# Patient Record
Sex: Male | Born: 1970 | Race: White | Hispanic: No | Marital: Married | Smoking: Current some day smoker
Health system: Southern US, Community
[De-identification: ages and names within clinical notes are randomized; demographics above are authoritative.]

## PROBLEM LIST (undated history)

## (undated) DIAGNOSIS — S7290XA Unspecified fracture of unspecified femur, initial encounter for closed fracture: Secondary | ICD-10-CM

## (undated) DIAGNOSIS — F1020 Alcohol dependence, uncomplicated: Secondary | ICD-10-CM

## (undated) DIAGNOSIS — I10 Essential (primary) hypertension: Secondary | ICD-10-CM

## (undated) DIAGNOSIS — R569 Unspecified convulsions: Secondary | ICD-10-CM

## (undated) DIAGNOSIS — R112 Nausea with vomiting, unspecified: Secondary | ICD-10-CM

## (undated) DIAGNOSIS — Z9889 Other specified postprocedural states: Secondary | ICD-10-CM

## (undated) HISTORY — DX: Alcohol dependence, uncomplicated: F10.20

## (undated) HISTORY — PX: WRIST SURGERY: SHX841

## (undated) HISTORY — PX: JOINT REPLACEMENT: SHX530

---

## 2001-10-09 ENCOUNTER — Encounter: Payer: Self-pay | Admitting: Orthopedic Surgery

## 2001-10-11 ENCOUNTER — Encounter: Payer: Self-pay | Admitting: Orthopedic Surgery

## 2001-10-11 ENCOUNTER — Inpatient Hospital Stay (HOSPITAL_COMMUNITY): Admission: RE | Admit: 2001-10-11 | Discharge: 2001-10-16 | Payer: Self-pay | Admitting: Orthopedic Surgery

## 2001-10-12 ENCOUNTER — Encounter: Payer: Self-pay | Admitting: Orthopedic Surgery

## 2001-10-15 ENCOUNTER — Encounter: Payer: Self-pay | Admitting: Orthopedic Surgery

## 2006-07-19 ENCOUNTER — Encounter: Admission: RE | Admit: 2006-07-19 | Discharge: 2006-07-19 | Payer: Self-pay | Admitting: Orthopedic Surgery

## 2006-12-18 ENCOUNTER — Inpatient Hospital Stay (HOSPITAL_COMMUNITY): Admission: RE | Admit: 2006-12-18 | Discharge: 2006-12-21 | Payer: Self-pay | Admitting: Orthopedic Surgery

## 2010-05-18 NOTE — Discharge Summary (Signed)
Eugene Jackson, Eugene Jackson            ACCOUNT NO.:  [id number]   MEDICAL RECORD NO.:  [id number]          PATIENT TYPE:  INP   LOCATION:  5029                         FACILITY:  [facility]   PHYSICIAN:  Loreta Ave, M.D. DATE OF BIRTH:  09/14/1970   DATE OF ADMISSION:  12/18/2006  DATE OF DISCHARGE:  12/21/2006                               DISCHARGE SUMMARY   FINAL DIAGNOSES:  1. Status post right total hip replacement for AVN/DJD.  2. Hypertension.  3. Nicotine use.   HISTORY OF PRESENT ILLNESS:  A 40 year old white male with history of  right hip DJD/AVN and chronic pain presented to our office for  preoperative evaluation for a total hip replacement.  He had  progressively worsening pain, which failed response with conservative  treatment.  Significant decrease in his daily activities due to the  ongoing complaint.   HOSPITAL COURSE:  On December 18, 2006, the patient was taken to the  [facility] OR and a right total hip replacement and procedure were  performed.  Surgeon Loreta Ave, M.D., and assistant Genene Churn.  Barry Dienes, PA-C.  Anesthesia general.  No specimens.  EBL less than 100 mL.  There were no surgical or anesthesia complications and the patient was  transferred to recovery in stable condition.  On December 19, 2006, the  patient doing well but complaining of some nausea from high-dose  morphine PCA.  Temperature 101.2, pulse 100, respirations 17, and blood  pressure 115/72.  Hemoglobin 11.7 and hematocrit 33.7.  Sodium 131,  potassium 3.8, chloride 101, CO2 24, BUN 4, creatinine 0.84, glucose  181, INR 1.2.  Dressing clean, dry, and intact.  Calf nontender and  neurovascularly intact distally.  Start PT and anticipate discharge home  in the next couple of days.  Change morphine PCA to low dose and IV to  normal saline plus 20 KCl.  Encouraged incentive spirometry.  Discontinued Foley.  Pharmacy protocol Coumadin was started.  On  December 20, 2006, the patient doing  well.  Progressing with therapy.  WBC 10.3, hemoglobin 11.0, hematocrit 30.9 and platelets 259.  Sodium  133, potassium 4.2, chloride 102, CO2 23, BUN 3, creatinine 0.75,  glucose 96, INR 2.3.  Discontinued PCA and saline lock IV.  On December 21, 2006, the patient doing well with good pain control.  Did great with  PT yesterday.  States that he is ready to go home.  Temperature 100.6,  pulse 89, respirations 18, blood pressure 107/68.  WBC 8.6, hematocrit  30.4, hemoglobin 10.8, platelets 310.  Sodium 135, potassium 4.1,  chloride 105, CO2 25, BUN 6, creatinine 0.79, glucose 99, INR 3.2.  Wound looks good.  Staples intact.  No drainage or signs of infection.  Neurovascularly intact.  He is ready for discharge home today.  We will  hold Coumadin dose today.   CONDITION:  Good and stable.   DISPOSITION:  Discharged home.   MEDICATIONS:  1. Atenolol 50 mg p.o. daily.  2. Norvasc 10 mg p.o. daily.  3. Percocet 5/325 one to two tabs p.o. q.4-6h. p.r.n. for pain.  4. Robaxin 500 mg  1 tab p.o. q.6h. p.r.n. spasms.  5. Coumadin pharmacy protocol.  Gentiva to manage dose and maintain      INR 2 to 3.  6. Combivent inhaler 1 puff q.4-6h. p.r.n.   INSTRUCTIONS:  The patient will work with home health, PT and OT to  improve his ambulation and strengthening.  He will be strict touchdown  weightbearing on the right lower extremity x6 weeks postop.  He is okay  to shower but no tub soaking.  Daily dressing changes with 4 x 4 gauze  and tape.  Coumadin x 4 weeks postop with DVT prophylaxis.  He will have  followup in the office with Dr. Eulah Pont 2 weeks postop for recheck.  Notify us before that time if there are any questions or concerns.      Genene Churn. Denton Meek.      Loreta Ave, M.D.  Electronically Signed    JMO/MEDQ  D:  12/21/2006  T:  12/21/2006  Job:  [id number]

## 2010-05-21 NOTE — Op Note (Signed)
Eugene Jackson, Eugene Jackson            ACCOUNT NO.:  [id number]   MEDICAL RECORD NO.:  [id number]          PATIENT TYPE:  INP   LOCATION:  2550                         FACILITY:  [facility]   PHYSICIAN:  Loreta Ave, M.D. DATE OF BIRTH:  1970/12/17   DATE OF PROCEDURE:  12/28/2006  DATE OF DISCHARGE:                               OPERATIVE REPORT   PREOPERATIVE DIAGNOSIS:  Avascular necrosis right hip, with collapse and  degenerative arthritis.   POSTOPERATIVE DIAGNOSIS:  Avascular necrosis right hip, with collapse  and degenerative arthritis.   PROCEDURE:  Right total hip replacement, Stryker Accolade prosthesis.  Press-Fit 54 mm PSL acetabulum screw fixation x2, and a 32 mm internal  diameter ceramic liner.  Press-Fit #4 Accolade femoral component. with a  35 mm neck, 127-degree neck angle..  A 32-mm +0 ceramic head.   SURGEON:  Loreta Ave, M.D.   ASSISTANT:  Genene Churn. Denton Meek., present throughout the entire case.   ANESTHESIA:  General.   BLOOD LOSS:  100 mL.   BLOOD GIVEN:  None.   SPECIMENS:  Excised bone with soft tissue.   COMPLICATIONS:  None.   PROCEDURE:  Soft, compressive, with abduction pillow.   PROCEDURE:  The patient was brought to the operating room and placed on  the operating table in the supine position.  After adequate anesthesia  had been obtained, turned to a lateral position, right side up.  Prepped  and draped in the usual sterile fashion.  Incision along the shaft of  the femur to the trochanter extending posterosuperior.  Skin and  subcutaneous tissue divided.  Iliotibial band exposed, incised.  Charnley retractor put in place.  Neurovascular structures were  identified and protected.  External rotator and capsule taken down off  the back of the intertrochanteric groove, tagged with FiberWire.  A  clear effusion.  The hip dislocated posteriorly.  Femoral neck cut one  fingerbreadth above the lesser trochanter, head removed.  Marked  delamination and avascular gross.  Acetabulum cleared, moderate changes  there.  Hypertrophic synovitis, redundant labrum all debrided.  Reaming  of the acetabulum to good bleeding bone and sufficient medial and  inferior placement.  Sized and fitted with a 54 mm cup, placed at 45  degrees of abduction, 20 degrees of anteversion.  Excellent capture and  fixation.  It was augmented with two screws through the cup, a 20 mm and  16 mm.  A 32 mm internal diameter, ceramic liner put in place.  Attention was turned to the femur.  Good filling proximally and distally  with the handheld reamers, and then fitted with a #4 component, which  restored normal femoral anteversion.  After appropriate trials, I used  the +0 x 32 mm head, which was attached and the hip was reduced.  External restoration of leg lengths, good stability in flexion and  extension.  Wound irrigated.  External rotator capsule repair of the  back of the intertrochanteric groove through drill holes and the suture  tied over a bony bridge.  Charnley retractor removed.  Iliotibial band  closed over #1 Vicryl, skin  and subcutaneous  tissues with Vicryl and staples.  Margins were injected  with Marcaine.  A sterile compressive dressing was applied.  Returned to  the supine position.  Abduction pillow placed.  Anesthesia reversed.  Brought to the recovery room.  Tolerated surgery well.  No  complications.      Loreta Ave, M.D.  Electronically Signed     DFM/MEDQ  D:  12/18/2006  T:  12/19/2006  Job:  [id number]

## 2010-05-21 NOTE — Op Note (Signed)
Eugene Jackson, Eugene Jackson                      ACCOUNT NO.:  [id number]   MEDICAL RECORD NO.:  [id number]                   PATIENT TYPE:  INP   LOCATION:  2899                                 FACILITY:  [facility]   PHYSICIAN:  Loreta Ave, M.D.              DATE OF BIRTH:  [birth year]   DATE OF PROCEDURE:  10/11/2001  DATE OF DISCHARGE:                                 OPERATIVE REPORT   PREOPERATIVE DIAGNOSIS:  Avascular necrosis with subsequent degenerative  arthritis, left hip.   POSTOPERATIVE DIAGNOSIS:  Avascular necrosis with subsequent degenerative  arthritis, left hip.   PROCEDURE:  Left total hip replacement utilizing Osteonics prosthesis.  A 56  mm Trident acetabular component with screw fixation x2 and a size F ceramic  insert, 32 mm internal diameter.  Press-fit femoral component.  A #8  SecureFit Plus stem with a 12 mm distal diameter.  A +5 32 mm ceramic  femoral head.   SURGEON:  Loreta Ave, M.D.   ASSISTANT:  Arlys John D. Petrarca, P.A.-C.   ANESTHESIA:  General.   ESTIMATED BLOOD LOSS:  200 cc.   SPECIMENS:  Excised femoral head.   CULTURES:  None.   COMPLICATIONS:  None.   DRESSING:  Soft compressive with abduction pillow.   DESCRIPTION OF PROCEDURE:  Patient brought to the operating room and placed  on the operating table in supine position.  After adequate anesthesia had  been obtained, turned to a lateral position with appropriate positioning and  support.  Incision along the femur extending posterior superior.  Skin and  subcutaneous tissue divided.  The iliotibial band exposed, incised, and the  Charnley retractor put in place.  Neurovascular structures identified and  protected.  External rotators and capsule taken down off the back of the  intertrochanteric groove of the femur and tagged with #1 Ethibond.  The hip  exposed.  Dislocated posteriorly.  Marked reactive synovitis and effusion  from the joint cleared.  Femoral head removed one  fingerbreadth above the  lesser trochanter in line with the definitive component.  The femoral head  resected.  Grade 3 and 4 changes from avascular necrosis with delamination  and fragmentation.  Grade 3 changes in the acetabulum.  Retractors put in  place.  Acetabulum exposed.  Enough changes to warrant total rather than  hemiarthroplasty.  Reaming to good bleeding bone in the acetabulum.  Sized  for a 56 mm component.  The metallic portion hammered in place at 45 degrees  of abduction and 20 of anteversion.  Fixation augmented with a 20 mm screw  above, a 16 mm screw in back.  A size F ceramic insert was placed within the  cup.  Attention turned to the femur.  Power and hand-held reamers up to good  fitting proximally and distally in the femur.  Sized for a #8 SecureFit Plus  component with a 12 mm distal stem.  After appropriate trialling,  a +5 head  with 127 degree neck angle was chosen to match leg lengths and produce  stability.  Once reaming complete, the definitive component assembled and  hammered in place.  The +5 femoral head was attached.  The hip reduced.  Excellent stability in flexion-extension with good motion and good  restoration of leg lengths.  The wound irrigated.  External rotators and  capsule repaired to the back of the intertrochanteric groove through drill  holes and over-tied over a bony bridge.  The Charnley retractor removed.  Iliotibial band closed with #1 Vicryl, skin and subcutaneous tissue with  Vicryl and staples.  Margins of the wound injected with Marcaine.  Sterile  compressive dressing applied.  Returned to the supine position.  Abduction  pillow placed.  Anesthesia reversed.  Brought to the recovery room.  Tolerated the surgery well with no complications.                                               Loreta Ave, M.D.    DFM/MEDQ  D:  10/11/2001  T:  10/12/2001  Job:  [id number]

## 2010-05-21 NOTE — Discharge Summary (Signed)
Eugene Jackson, Eugene Jackson                      ACCOUNT NO.:  [id number]   MEDICAL RECORD NO.:  [id number]                   PATIENT TYPE:  EMS   LOCATION:  MINO                                 FACILITY:  [facility]   PHYSICIAN:  Oris Drone. Petrarca, P.A.-C.          DATE OF BIRTH:  1970/03/14   DATE OF ADMISSION:  10/17/2001  DATE OF DISCHARGE:  10/17/2001                                 DISCHARGE SUMMARY   ADMISSION DIAGNOSIS:  Avascular necrosis of the left hip.   DISCHARGE DIAGNOSES:  Avascular necrosis of the left hip.   PROCEDURE:  Left total hip replacement.   HISTORY OF PRESENT ILLNESS:  The patient is a 40 year old white male with  bilateral AVN of the  hips. He had significant pain without much in the way  of any relief. He is now having  difficulties with activities of daily  living as well as with ambulation. He is now indicated for admission and  left hemiarthroplasty versus total hip replacement.   HOSPITAL COURSE:  This 40 year old white male was admitted on October 11, 2001, and after appropriate laboratory studies were obtained, as well as 1  gm Ancef  IV on call to the operating room, he was  taken to the operating  room where he underwent a left total hip replacement. He tolerated the  procedure well. He was allowed out of bed to a chair the following day. A  Foley catheter was introduced on the day of surgery and discontinued on  postoperative day #2. A Dilaudid PCA pump was used for pain management.   We consulted physical therapy and occupational therapy for his ambulation.  He may be touchdown weightbearing for 10 pounds only. He was allowed out of  bed to a chair the following day. Because of a previous history of ETOH use,  DT precautions were ordered. Robaxin was used for muscle spasms. Because of  a temperature, a chest x-ray, CBC and differential, blood cultures x 2, as  well as a urinalysis and completed metabolic panel were used. There were  difficulties  with the use of Tylenol to decrease his temperature.  Suppositories was ordered because of an inability to hold down the Tylenol.   His IVs were increased on the 10th to 100 cc per hour. Intravenous Tequin  400 mg every 24 hours was begun. Albuterol 5 mg and Ventolin 5 mg nebulizer  was used every 6 hours.   His dressing was changed on the 11th. His PCA pump was discontinued also on  the 11th.  There was a question of whether antibiotics should be continued.  Initially they had been stopped on the 11th, but then were restarted on the  12th. His temperature improved.   His IV was discontinued  on the 13th. He was discharged on the 14th to  return back in our office for followup. He was discharged in improved  condition.   LABORATORY DATA:  An EKG  revealed normal sinus rhythm with normal EKG.  A  chest x-ray on October 15, 2001, revealed stable chest with a small left  effusion and left basilar atelectatic changes. A chest x-ray on October 12, 2001, revealed left basilar atelectasis and a small left pleural effusion.   He was admitted with a hemoglobin of 15.8, hematocrit 45.5%, white count  2800, platelets 348,000. His discharge hemoglobin was 13.5, hematocrit  40.3%, white count 8300, platelets 427,000. Chemistries revealed  preoperatively sodium 137, potassium 4.0, chloride 101, CO2 27, glucose 115,  BUN 6, creatinine 1.0, calcium 9.5. Total protein 7.3, albumin 3.6, AST 34,  ALT 30, ALP 70, total bilirubin 0.5. Discharge sodium 134, potassium 3.4,  chloride 96, CO2 28, BUN 6, creatinine 0.8, calcium 9.0.  Urinalysis  preoperatively was benign. Urine of October 12, 2001, revealed 3 to 6  whites, 0 to 2 reds, no bacteria. He was blood type AB positive, antibody  screen negative. Blood cultures x 2 showed no growth.   DISCHARGE MEDICATIONS:  1. Percocet 5/325 1 to 2 tablets p.o. q.4h. p.r.n. pain.  2. Coumadin 5 mg tablets as directed by [facility].  3. Colace 100 mg 1 tablet  b.i.d. with meal.  4. Iron sulfate 325 mg b.i.d. with food.   DISCHARGE INSTRUCTIONS:  Activity as tolerated with physical therapy. No  restrictions on diet. He will change his dressing daily and keep it clean  and dry. He is to call if he has increased redness, pain, drainage or  temperature over 101 degrees  Farenheit.   FOLLOW UP:  He will follow back up  with [place] on Friday morning.   DISPOSITION:  He was discharged in improved condition.                                               Oris Drone Petrarca, P.A.-C.    BDP/MEDQ  D:  10/27/2001  T:  10/29/2001  Job:  [id number]

## 2010-10-08 LAB — CBC
HCT: 30.4 — ABNORMAL LOW
HCT: 30.9 — ABNORMAL LOW
HCT: 33.7 — ABNORMAL LOW
Hemoglobin: 11 — ABNORMAL LOW
Hemoglobin: 11.7 — ABNORMAL LOW
MCHC: 35.5
MCV: 102.5 — ABNORMAL HIGH
MCV: 103.2 — ABNORMAL HIGH
Platelets: 310
RBC: 3 — ABNORMAL LOW
RDW: 12
WBC: 8.6
WBC: 9.4

## 2010-10-08 LAB — BASIC METABOLIC PANEL
BUN: 6
CO2: 23
Chloride: 101
Chloride: 101
Chloride: 102
GFR calc Af Amer: >60
GFR calc non Af Amer: >60
Glucose, Bld: 131 — ABNORMAL HIGH
Glucose, Bld: 99
Potassium: 3.8
Potassium: 4.1
Sodium: 131 — ABNORMAL LOW
Sodium: 133 — ABNORMAL LOW

## 2010-10-08 LAB — URINALYSIS, ROUTINE W REFLEX MICROSCOPIC
Bilirubin Urine: NEGATIVE
Glucose, UA: NEGATIVE
Nitrite: NEGATIVE
Specific Gravity, Urine: 1.022
pH: 5.5

## 2010-10-11 LAB — URINALYSIS, ROUTINE W REFLEX MICROSCOPIC
Glucose, UA: NEGATIVE
Hgb urine dipstick: NEGATIVE
Ketones, ur: 40 — AB
pH: 6

## 2010-10-11 LAB — APTT: aPTT: 28

## 2010-10-11 LAB — COMPREHENSIVE METABOLIC PANEL
ALT: 28
AST: 39 — ABNORMAL HIGH
Alkaline Phosphatase: 77
CO2: 24
Chloride: 99
Creatinine, Ser: 0.81
GFR calc Af Amer: >60
GFR calc non Af Amer: >60
Potassium: 3.6
Total Bilirubin: 1

## 2010-10-11 LAB — CBC
MCV: 103.9 — ABNORMAL HIGH
RBC: 4.23
WBC: 7.5

## 2010-10-11 LAB — TYPE AND SCREEN: Antibody Screen: NEGATIVE

## 2012-08-30 ENCOUNTER — Inpatient Hospital Stay (HOSPITAL_COMMUNITY)
Admission: EM | Admit: 2012-08-30 | Discharge: 2012-09-07 | DRG: 896 | Disposition: A | Discharge status: Home / Self Care | Payer: No Typology Code available for payment source | Attending: Internal Medicine | Admitting: Internal Medicine

## 2012-08-30 ENCOUNTER — Encounter (HOSPITAL_COMMUNITY): Payer: Self-pay | Admitting: Emergency Medicine

## 2012-08-30 ENCOUNTER — Emergency Department (HOSPITAL_COMMUNITY): Payer: No Typology Code available for payment source

## 2012-08-30 DIAGNOSIS — R7309 Other abnormal glucose: Secondary | ICD-10-CM | POA: Diagnosis present

## 2012-08-30 DIAGNOSIS — E861 Hypovolemia: Secondary | ICD-10-CM | POA: Diagnosis present

## 2012-08-30 DIAGNOSIS — J9801 Acute bronchospasm: Secondary | ICD-10-CM | POA: Diagnosis present

## 2012-08-30 DIAGNOSIS — F10939 Alcohol use, unspecified with withdrawal, unspecified: Secondary | ICD-10-CM | POA: Diagnosis present

## 2012-08-30 DIAGNOSIS — E876 Hypokalemia: Secondary | ICD-10-CM

## 2012-08-30 DIAGNOSIS — G9340 Encephalopathy, unspecified: Secondary | ICD-10-CM

## 2012-08-30 DIAGNOSIS — Z993 Dependence on wheelchair: Secondary | ICD-10-CM

## 2012-08-30 DIAGNOSIS — E871 Hypo-osmolality and hyponatremia: Secondary | ICD-10-CM

## 2012-08-30 DIAGNOSIS — IMO0002 Reserved for concepts with insufficient information to code with codable children: Secondary | ICD-10-CM

## 2012-08-30 DIAGNOSIS — R748 Abnormal levels of other serum enzymes: Secondary | ICD-10-CM

## 2012-08-30 DIAGNOSIS — R945 Abnormal results of liver function studies: Secondary | ICD-10-CM | POA: Diagnosis present

## 2012-08-30 DIAGNOSIS — I10 Essential (primary) hypertension: Secondary | ICD-10-CM | POA: Diagnosis present

## 2012-08-30 DIAGNOSIS — K709 Alcoholic liver disease, unspecified: Secondary | ICD-10-CM

## 2012-08-30 DIAGNOSIS — F10929 Alcohol use, unspecified with intoxication, unspecified: Secondary | ICD-10-CM

## 2012-08-30 DIAGNOSIS — E872 Acidosis, unspecified: Secondary | ICD-10-CM

## 2012-08-30 DIAGNOSIS — F1010 Alcohol abuse, uncomplicated: Principal | ICD-10-CM

## 2012-08-30 DIAGNOSIS — J4489 Other specified chronic obstructive pulmonary disease: Secondary | ICD-10-CM | POA: Diagnosis present

## 2012-08-30 DIAGNOSIS — Z96649 Presence of unspecified artificial hip joint: Secondary | ICD-10-CM

## 2012-08-30 DIAGNOSIS — N179 Acute kidney failure, unspecified: Secondary | ICD-10-CM

## 2012-08-30 DIAGNOSIS — R7989 Other specified abnormal findings of blood chemistry: Secondary | ICD-10-CM

## 2012-08-30 DIAGNOSIS — D61818 Other pancytopenia: Secondary | ICD-10-CM

## 2012-08-30 DIAGNOSIS — D696 Thrombocytopenia, unspecified: Secondary | ICD-10-CM

## 2012-08-30 DIAGNOSIS — K529 Noninfective gastroenteritis and colitis, unspecified: Secondary | ICD-10-CM

## 2012-08-30 DIAGNOSIS — F17200 Nicotine dependence, unspecified, uncomplicated: Secondary | ICD-10-CM | POA: Diagnosis present

## 2012-08-30 DIAGNOSIS — R062 Wheezing: Secondary | ICD-10-CM | POA: Diagnosis present

## 2012-08-30 DIAGNOSIS — D709 Neutropenia, unspecified: Secondary | ICD-10-CM

## 2012-08-30 DIAGNOSIS — A419 Sepsis, unspecified organism: Secondary | ICD-10-CM

## 2012-08-30 DIAGNOSIS — F10239 Alcohol dependence with withdrawal, unspecified: Secondary | ICD-10-CM | POA: Diagnosis present

## 2012-08-30 DIAGNOSIS — J449 Chronic obstructive pulmonary disease, unspecified: Secondary | ICD-10-CM | POA: Diagnosis present

## 2012-08-30 DIAGNOSIS — D638 Anemia in other chronic diseases classified elsewhere: Secondary | ICD-10-CM

## 2012-08-30 DIAGNOSIS — R569 Unspecified convulsions: Secondary | ICD-10-CM

## 2012-08-30 DIAGNOSIS — G40401 Other generalized epilepsy and epileptic syndromes, not intractable, with status epilepticus: Secondary | ICD-10-CM | POA: Diagnosis not present

## 2012-08-30 DIAGNOSIS — K5100 Ulcerative (chronic) pancolitis without complications: Secondary | ICD-10-CM | POA: Diagnosis present

## 2012-08-30 DIAGNOSIS — K859 Acute pancreatitis without necrosis or infection, unspecified: Secondary | ICD-10-CM

## 2012-08-30 DIAGNOSIS — Z79899 Other long term (current) drug therapy: Secondary | ICD-10-CM

## 2012-08-30 DIAGNOSIS — D65 Disseminated intravascular coagulation [defibrination syndrome]: Secondary | ICD-10-CM

## 2012-08-30 DIAGNOSIS — E440 Moderate protein-calorie malnutrition: Secondary | ICD-10-CM

## 2012-08-30 HISTORY — DX: Unspecified fracture of unspecified femur, initial encounter for closed fracture: S72.90XA

## 2012-08-30 HISTORY — DX: Essential (primary) hypertension: I10

## 2012-08-30 HISTORY — DX: Other specified postprocedural states: R11.2

## 2012-08-30 HISTORY — DX: Other specified postprocedural states: Z98.890

## 2012-08-30 LAB — CBC WITH DIFFERENTIAL/PLATELET
Basophils Relative: 0 % (ref 0–1)
Eosinophils Absolute: 0.1 10*3/uL (ref 0.0–0.7)
Eosinophils Relative: 2 % (ref 0–5)
HCT: 25.1 % — ABNORMAL LOW (ref 39.0–52.0)
Hemoglobin: 9.5 g/dL — ABNORMAL LOW (ref 13.0–17.0)
MCH: 36.8 pg — ABNORMAL HIGH (ref 26.0–34.0)
MCHC: 37.8 g/dL — ABNORMAL HIGH (ref 30.0–36.0)
Monocytes Absolute: 0.1 10*3/uL (ref 0.1–1.0)
Neutro Abs: 1.5 10*3/uL — ABNORMAL LOW (ref 1.7–7.7)
RBC: 2.58 MIL/uL — ABNORMAL LOW (ref 4.22–5.81)

## 2012-08-30 LAB — COMPREHENSIVE METABOLIC PANEL
AST: 51 U/L — ABNORMAL HIGH (ref 0–37)
Albumin: 3.3 g/dL — ABNORMAL LOW (ref 3.5–5.2)
Alkaline Phosphatase: 93 U/L (ref 39–117)
BUN: 33 mg/dL — ABNORMAL HIGH (ref 6–23)
CO2: 23 mEq/L (ref 19–32)
Chloride: <65 mEq/L — CL (ref 96–112)
Creatinine, Ser: 1.68 mg/dL — ABNORMAL HIGH (ref 0.50–1.35)
GFR calc non Af Amer: 48 mL/min — ABNORMAL LOW (ref >90)
Potassium: 2 mEq/L — CL (ref 3.5–5.1)
Total Bilirubin: 1.8 mg/dL — ABNORMAL HIGH (ref 0.3–1.2)

## 2012-08-30 LAB — BLOOD GAS, ARTERIAL
Acid-Base Excess: 7.2 mmol/L — ABNORMAL HIGH (ref 0.0–2.0)
Bicarbonate: 29 mEq/L — ABNORMAL HIGH (ref 20.0–24.0)
Drawn by: 307971
TCO2: 26.3 mmol/L (ref 0–100)
pCO2 arterial: 29.4 mmHg — ABNORMAL LOW (ref 35.0–45.0)
pO2, Arterial: 81.9 mmHg (ref 80.0–100.0)

## 2012-08-30 LAB — AMMONIA: Ammonia: 47 umol/L (ref 11–60)

## 2012-08-30 LAB — LIPASE, BLOOD: Lipase: 279 U/L — ABNORMAL HIGH (ref 11–59)

## 2012-08-30 LAB — ABO/RH: ABO/RH(D): AB POS

## 2012-08-30 LAB — PROTIME-INR
INR: 1.1 (ref 0.00–1.49)
Prothrombin Time: 14 seconds (ref 11.6–15.2)

## 2012-08-30 LAB — MRSA PCR SCREENING: MRSA by PCR: NEGATIVE

## 2012-08-30 LAB — CG4 I-STAT (LACTIC ACID): Lactic Acid, Venous: 11.96 mmol/L — ABNORMAL HIGH (ref 0.5–2.2)

## 2012-08-30 LAB — MAGNESIUM: Magnesium: 1.3 mg/dL — ABNORMAL LOW (ref 1.5–2.5)

## 2012-08-30 LAB — LACTIC ACID, PLASMA: Lactic Acid, Venous: 1.1 mmol/L (ref 0.5–2.2)

## 2012-08-30 LAB — TROPONIN I: Troponin I: <0.3 ng/mL (ref <0.30)

## 2012-08-30 MED ORDER — ASPIRIN 81 MG PO CHEW: 324.0000 mg | CHEWABLE_TABLET | ORAL | Status: DC

## 2012-08-30 MED ORDER — ONDANSETRON 8 MG/NS 50 ML IVPB
8.0000 mg | Freq: Four times a day (QID) | INTRAVENOUS | Status: DC | PRN
  Administered 2012-08-30 – 2012-09-01 (×3): 8 mg via INTRAVENOUS
  Filled 2012-08-30 (×3): qty 8

## 2012-08-30 MED ORDER — SODIUM CHLORIDE 0.9 % IV BOLUS (SEPSIS)
1000.0000 mL | Freq: Once | INTRAVENOUS | Status: AC
  Administered 2012-08-30: 1000 mL via INTRAVENOUS

## 2012-08-30 MED ORDER — ALBUTEROL SULFATE (5 MG/ML) 0.5% IN NEBU: 2.5000 mg | INHALATION_SOLUTION | Freq: Four times a day (QID) | RESPIRATORY_TRACT | Status: DC

## 2012-08-30 MED ORDER — SODIUM CHLORIDE 0.9 % IV SOLN
INTRAVENOUS | Status: DC
  Administered 2012-08-30: 21:00:00 via INTRAVENOUS

## 2012-08-30 MED ORDER — KCL IN DEXTROSE-NACL 40-5-0.9 MEQ/L-%-% IV SOLN
INTRAVENOUS | Status: DC
  Administered 2012-08-30 – 2012-09-02 (×6): via INTRAVENOUS
  Administered 2012-09-02: 1000 mL via INTRAVENOUS
  Filled 2012-08-30 (×10): qty 1000

## 2012-08-30 MED ORDER — LORAZEPAM 2 MG/ML IJ SOLN
0.0000 mg | Freq: Four times a day (QID) | INTRAMUSCULAR | Status: AC
  Filled 2012-08-30: qty 1

## 2012-08-30 MED ORDER — FOLIC ACID 1 MG PO TABS
1.0000 mg | ORAL_TABLET | Freq: Once | ORAL | Status: DC
  Filled 2012-08-30: qty 1

## 2012-08-30 MED ORDER — SODIUM CHLORIDE 0.9 % IV SOLN: 250.0000 mL | INTRAVENOUS | Status: DC | PRN

## 2012-08-30 MED ORDER — CIPROFLOXACIN IN D5W 400 MG/200ML IV SOLN
400.0000 mg | Freq: Two times a day (BID) | INTRAVENOUS | Status: DC
  Administered 2012-08-30 – 2012-09-04 (×10): 400 mg via INTRAVENOUS
  Filled 2012-08-30 (×10): qty 200

## 2012-08-30 MED ORDER — METRONIDAZOLE IN NACL 5-0.79 MG/ML-% IV SOLN
500.0000 mg | Freq: Three times a day (TID) | INTRAVENOUS | Status: DC
  Administered 2012-08-30 – 2012-09-03 (×11): 500 mg via INTRAVENOUS
  Filled 2012-08-30 (×13): qty 100

## 2012-08-30 MED ORDER — LORAZEPAM 2 MG/ML IJ SOLN
0.0000 mg | INTRAMUSCULAR | Status: AC
  Administered 2012-08-30: 2 mg via INTRAVENOUS
  Administered 2012-08-31 (×2): 1 mg via INTRAVENOUS
  Filled 2012-08-30: qty 1

## 2012-08-30 MED ORDER — THIAMINE HCL 100 MG/ML IJ SOLN
500.0000 mg | Freq: Every day | INTRAMUSCULAR | Status: DC
  Administered 2012-08-31: 500 mg via INTRAVENOUS
  Filled 2012-08-30 (×5): qty 5

## 2012-08-30 MED ORDER — SODIUM CHLORIDE 0.9 % IV SOLN: 1000.0000 mL | Freq: Once | INTRAVENOUS | Status: DC

## 2012-08-30 MED ORDER — VITAMIN B-1 100 MG PO TABS
500.0000 mg | ORAL_TABLET | Freq: Every day | ORAL | Status: DC
  Administered 2012-09-01 – 2012-09-07 (×7): 500 mg via ORAL
  Filled 2012-08-30 (×8): qty 5

## 2012-08-30 MED ORDER — ALBUTEROL SULFATE (5 MG/ML) 0.5% IN NEBU
2.5000 mg | INHALATION_SOLUTION | Freq: Four times a day (QID) | RESPIRATORY_TRACT | Status: DC
  Administered 2012-08-30 – 2012-09-07 (×25): 2.5 mg via RESPIRATORY_TRACT
  Filled 2012-08-30 (×25): qty 0.5

## 2012-08-30 MED ORDER — IPRATROPIUM BROMIDE 0.02 % IN SOLN: 0.5000 mg | RESPIRATORY_TRACT | Status: DC | PRN

## 2012-08-30 MED ORDER — LORAZEPAM 2 MG/ML IJ SOLN
1.0000 mg | INTRAMUSCULAR | Status: AC | PRN
  Filled 2012-08-30: qty 1

## 2012-08-30 MED ORDER — IPRATROPIUM BROMIDE 0.02 % IN SOLN: 0.5000 mg | RESPIRATORY_TRACT | Status: DC

## 2012-08-30 MED ORDER — ALBUTEROL SULFATE (5 MG/ML) 0.5% IN NEBU
2.5000 mg | INHALATION_SOLUTION | RESPIRATORY_TRACT | Status: DC | PRN
  Filled 2012-08-30: qty 0.5

## 2012-08-30 MED ORDER — LORAZEPAM 1 MG PO TABS: 1.0000 mg | ORAL_TABLET | Freq: Four times a day (QID) | ORAL | Status: DC | PRN

## 2012-08-30 MED ORDER — MAGNESIUM SULFATE 40 MG/ML IJ SOLN
2.0000 g | Freq: Once | INTRAMUSCULAR | Status: DC
  Filled 2012-08-30: qty 50

## 2012-08-30 MED ORDER — VANCOMYCIN HCL 500 MG IV SOLR: 500.0000 mg | Freq: Two times a day (BID) | INTRAVENOUS | Status: DC

## 2012-08-30 MED ORDER — MAGNESIUM SULFATE 40 MG/ML IJ SOLN
4.0000 g | Freq: Once | INTRAMUSCULAR | Status: AC
  Administered 2012-08-30: 4 g via INTRAVENOUS
  Filled 2012-08-30: qty 100

## 2012-08-30 MED ORDER — PANTOPRAZOLE SODIUM 40 MG IV SOLR
40.0000 mg | Freq: Every day | INTRAVENOUS | Status: DC
  Administered 2012-08-31 (×2): 40 mg via INTRAVENOUS
  Filled 2012-08-30 (×3): qty 40

## 2012-08-30 MED ORDER — ADULT MULTIVITAMIN W/MINERALS CH
1.0000 | ORAL_TABLET | Freq: Once | ORAL | Status: DC
  Filled 2012-08-30: qty 1

## 2012-08-30 MED ORDER — SODIUM CHLORIDE 0.9 % IV SOLN: 1000.0000 mL | INTRAVENOUS | Status: DC

## 2012-08-30 MED ORDER — VANCOMYCIN HCL IN DEXTROSE 1-5 GM/200ML-% IV SOLN
1000.0000 mg | Freq: Once | INTRAVENOUS | Status: DC
  Administered 2012-08-30: 1000 mg via INTRAVENOUS
  Filled 2012-08-30: qty 200

## 2012-08-30 MED ORDER — THIAMINE HCL 100 MG/ML IJ SOLN
100.0000 mg | Freq: Once | INTRAMUSCULAR | Status: AC
  Administered 2012-08-30: 200 mg via INTRAVENOUS
  Filled 2012-08-30: qty 2

## 2012-08-30 MED ORDER — IPRATROPIUM BROMIDE 0.02 % IN SOLN
0.5000 mg | Freq: Four times a day (QID) | RESPIRATORY_TRACT | Status: DC
  Administered 2012-08-30 – 2012-09-07 (×25): 0.5 mg via RESPIRATORY_TRACT
  Filled 2012-08-30 (×24): qty 2.5

## 2012-08-30 MED ORDER — IOHEXOL 300 MG/ML  SOLN
80.0000 mL | Freq: Once | INTRAMUSCULAR | Status: AC | PRN
  Administered 2012-08-30: 80 mL via INTRAVENOUS

## 2012-08-30 MED ORDER — SODIUM CHLORIDE 0.9 % IV SOLN
1.0000 mg | Freq: Every day | INTRAVENOUS | Status: DC
  Administered 2012-08-30 – 2012-09-05 (×7): 1 mg via INTRAVENOUS
  Filled 2012-08-30 (×8): qty 0.2

## 2012-08-30 MED ORDER — ASPIRIN 300 MG RE SUPP: 300.0000 mg | RECTAL | Status: DC

## 2012-08-30 MED ORDER — SODIUM CHLORIDE 0.9 % IV SOLN
1000.0000 mL | Freq: Once | INTRAVENOUS | Status: AC
  Administered 2012-08-30: 1000 mL via INTRAVENOUS

## 2012-08-30 MED ORDER — POTASSIUM CHLORIDE 10 MEQ/100ML IV SOLN
10.0000 meq | INTRAVENOUS | Status: AC
  Administered 2012-08-30 (×4): 10 meq via INTRAVENOUS
  Filled 2012-08-30 (×4): qty 100

## 2012-08-30 MED ORDER — IOHEXOL 300 MG/ML  SOLN
50.0000 mL | Freq: Once | INTRAMUSCULAR | Status: AC | PRN
  Administered 2012-08-30: 50 mL via ORAL

## 2012-08-30 MED ORDER — PIPERACILLIN-TAZOBACTAM 3.375 G IVPB: 3.3750 g | Freq: Three times a day (TID) | INTRAVENOUS | Status: DC

## 2012-08-30 MED ORDER — ADULT MULTIVITAMIN W/MINERALS CH
1.0000 | ORAL_TABLET | Freq: Every day | ORAL | Status: DC
  Administered 2012-09-02 – 2012-09-07 (×6): 1 via ORAL
  Filled 2012-08-30 (×8): qty 1

## 2012-08-30 MED ORDER — PIPERACILLIN-TAZOBACTAM 3.375 G IVPB 30 MIN
3.3750 g | Freq: Once | INTRAVENOUS | Status: DC
  Administered 2012-08-30: 3.375 g via INTRAVENOUS
  Filled 2012-08-30: qty 50

## 2012-08-30 NOTE — Progress Notes (Signed)
Patient resting at this time.  Patient's sister at bedside.  As per patient's siter, the patient's pcp is Dr. Cheri Rous of [facility].

## 2012-08-30 NOTE — Progress Notes (Signed)
eLink Physician-Brief Progress Note Patient Name: RYHEEM JAY DOB: 1970-04-30 MRN: [id number]  Date of Service  08/30/2012   HPI/Events of Note   diffuse colitis on CT   eICU Interventions  Cipro/flagyl chk c diff pcr for comlpetion, h/o priro abx not clear   Intervention Category Intermediate Interventions: Diagnostic test evaluation  ALVA,RAKESH V. 08/30/2012, 7:29 PM

## 2012-08-30 NOTE — Progress Notes (Addendum)
ANTIBIOTIC CONSULT NOTE - INITIAL  Pharmacy Consult for Vancomycin, Zosyn Indication: sepsis  No Known Allergies  Patient Measurements: Height: 5\' 7"  (170.2 cm) Weight: 140 lb (63.504 kg) IBW/kg (Calculated) : 66.1  Vital Signs: Temp: 98.2 F (36.8 C) (08/28 1454) Temp src: Oral (08/28 1454) BP: 94/63 mmHg (08/28 1454) Intake/Output from previous day:   Intake/Output from this shift:    Labs:  Recent Labs  08/30/12 1500  WBC 2.6*  HGB 9.5*  PLT 24*  CREATININE 1.68*   Estimated Creatinine Clearance: 50.4 ml/min (by C-G formula based on Cr of 1.68). No results found for this basename: VANCOTROUGH, VANCOPEAK, VANCORANDOM, GENTTROUGH, GENTPEAK, GENTRANDOM, TOBRATROUGH, TOBRAPEAK, TOBRARND, AMIKACINPEAK, AMIKACINTROU, AMIKACIN,  in the last 72 hours   Microbiology: No results found for this or any previous visit (from the past 720 hour(s)).  Medical History: Past Medical History  Diagnosis Date  . Hypertension   . Broken femur     right  . PONV (postoperative nausea and vomiting)     Assessment: 42 yo presented 8/28 with c/o generalized weakness, SOB, N/V/D x 1 week and abnormal labs drawn by PCP office 8/27. In ED, WBC 2.6K, Hgb 9.5, Plts 24K, Na+ 112, K+ 2, Cl- < 65, Scr 1.68, lactic acid elevated at 11.96, pt tachycardic.  [facility] called. MD ordered for Vancomycin, Zosyn per pharmacy - first dose already sent to the ED. CT head and CXR with no acute findings.  ?source of infection. Scr 1.68 for CG CrCl 50 ml/min and normalized CrCl of 57 ml/min  Goal of Therapy:  Vancomycin trough level 15-20 mcg/ml Appropriate renal adjustment of Zosyn  Plan:   Give first dose of Vancomycin and Zosyn previously ordered then continue with Vancomycin 500mg  IV q12h and Zosyn 3.375 gm IV q8h (extended infusion over 4 hours)  Will contact MD to consider ordering blood cultures  Pharmacy will f/u   Geoffry Paradise, PharmD, BCPS Pager: [phone] 4:40 PM Pharmacy #:  [id number]   Addendum:   Abx discontinued by CCM  Geoffry Paradise, PharmD, BCPS Pager: [phone] 5:14 PM Pharmacy #: [id number]

## 2012-08-30 NOTE — ED Notes (Signed)
Pt presenting to ed with c/o abnormal labs and generalized weakness. Pt denies rectal bleeding and hemoptysis at this time pt states positive dizziness and lightheadedness at this time

## 2012-08-30 NOTE — Progress Notes (Signed)
Patient does not use a computer and does not want to sign up for My Chart. Briscoe Burns BSN, RN-BC Admissions RN  08/30/2012 4:35 PM

## 2012-08-30 NOTE — ED Notes (Signed)
Patient transported to CT 

## 2012-08-30 NOTE — ED Provider Notes (Signed)
CSN: [id number]     Arrival date & time 08/30/12  1446 History   First MD Initiated Contact with Patient 08/30/12 1502     Chief Complaint  Patient presents with  . abnormal labs   (Consider location/radiation/quality/duration/timing/severity/associated sxs/prior Treatment) HPI Comments: Patient brought to the ER for evaluation of generalized weakness. Patient reportedly has had nausea, vomiting and diarrhea for a week. Family reports that he is an alcoholic and has been drinking heavily this week. He has been experiencing dizziness, weakness and shortness of breath, especially with standing and exertion. He denies chest pain. There has not been any abdominal pain associated with the symptoms. Patient was seen by his primary doctor yesterday, labs were drawn. He was called today and told that many of his labs including his red cells were abnormal. He has not noticed any rectal bleeding, melena and has not been blood in his vomit. Family also report that they have noticed that it seems like his left eye has been turning in, has not looked forward like his right eye - first noticed earlier in the week.   Past Medical History  Diagnosis Date  . Hypertension    Past Surgical History  Procedure Laterality Date  . Wrist surgery    . Joint replacement     No family history on file. History  Substance Use Topics  . Smoking status: Current Every Day Smoker -- 0.50 packs/day    Types: Cigarettes  . Smokeless tobacco: Not on file  . Alcohol Use: Yes     Comment: daily     Review of Systems  Constitutional: Negative for fever.  Respiratory: Positive for shortness of breath.   Cardiovascular: Positive for palpitations. Negative for chest pain.  Gastrointestinal: Positive for nausea, vomiting and diarrhea. Negative for abdominal pain and anal bleeding.  Musculoskeletal: Negative for back pain.  Neurological: Positive for dizziness and weakness.  All other systems reviewed and are  negative.    Allergies  Review of patient's allergies indicates no known allergies.  Home Medications  No current outpatient prescriptions on file. BP 94/63  Temp(Src) 98.2 F (36.8 C) (Oral)  Resp 18 Physical Exam  Constitutional: He is oriented to person, place, and time. He appears well-developed and well-nourished. No distress.  HENT:  Head: Normocephalic and atraumatic.  Right Ear: Hearing normal.  Left Ear: Hearing normal.  Nose: Nose normal.  Mouth/Throat: Oropharynx is clear and moist and mucous membranes are normal.  Eyes: Conjunctivae and EOM are normal. Pupils are equal, round, and reactive to light.  Neck: Normal range of motion. Neck supple.  Cardiovascular: Regular rhythm, S1 normal and S2 normal.  Exam reveals no gallop and no friction rub.   No murmur heard. Pulmonary/Chest: Effort normal and breath sounds normal. No respiratory distress. He exhibits no tenderness.  Abdominal: Soft. Normal appearance and bowel sounds are normal. There is no hepatosplenomegaly. There is no tenderness. There is no rebound, no guarding, no tenderness at McBurney's point and negative Murphy's sign. No hernia.  Musculoskeletal: Normal range of motion.  Neurological: He is alert and oriented to person, place, and time. He has normal strength. No cranial nerve deficit or sensory deficit. Coordination normal. GCS eye subscore is 4. GCS verbal subscore is 5. GCS motor subscore is 6.  Skin: Skin is warm, dry and intact. No rash noted. No cyanosis. There is pallor (jaundice).  Psychiatric: He has a normal mood and affect. His speech is normal and behavior is normal. Thought content normal.  ED Course  Procedures (including critical care time)  EKG:  Date: 08/30/2012  Rate: 110  Rhythm: sinus tachycardia  QRS Axis: normal  Intervals: normal  ST/T Wave abnormalities: nonspecific ST/T changes  Conduction Disutrbances:none  Narrative Interpretation:   Old EKG Reviewed: tachycardia now  present, o/w no sig change   Labs Review Labs Reviewed  CBC WITH DIFFERENTIAL - Abnormal; Notable for the following:    WBC 2.6 (*)    RBC 2.58 (*)    Hemoglobin 9.5 (*)    HCT 25.1 (*)    MCH 36.8 (*)    MCHC 37.8 (*)    RDW 16.7 (*)    Platelets 24 (*)    Neutro Abs 1.5 (*)    All other components within normal limits  COMPREHENSIVE METABOLIC PANEL - Abnormal; Notable for the following:    Sodium 112 (*)    Potassium 2.0 (*)    Chloride <65 (*)    Glucose, Bld 127 (*)    BUN 33 (*)    Creatinine, Ser 1.68 (*)    Albumin 3.3 (*)    AST 51 (*)    Total Bilirubin 1.8 (*)    GFR calc non Af Amer 48 (*)    GFR calc Af Amer 56 (*)    All other components within normal limits  LIPASE, BLOOD - Abnormal; Notable for the following:    Lipase 279 (*)    All other components within normal limits  CG4 I-STAT (LACTIC ACID) - Abnormal; Notable for the following:    Lactic Acid, Venous 11.96 (*)    All other components within normal limits  URINE CULTURE  CULTURE, BLOOD (ROUTINE X 2)  CULTURE, BLOOD (ROUTINE X 2)  AMMONIA  PROTIME-INR  TROPONIN I  URINALYSIS, ROUTINE W REFLEX MICROSCOPIC  BLOOD GAS, ARTERIAL  MAGNESIUM  TYPE AND SCREEN  ABO/RH   Imaging Review Dg Chest 1 View  08/30/2012   *RADIOLOGY REPORT*  Clinical Data: Dizziness  CHEST - 1 VIEW  Comparison: 12/12/2006  Findings: Cardiomediastinal silhouette is stable.  No acute infiltrate or pleural effusion.  No pulmonary edema.  IMPRESSION: No active disease.  No significant change.   Original Report Authenticated By: Natasha Mead, M.D.   Ct Head Wo Contrast  08/30/2012   *RADIOLOGY REPORT*  Clinical Data:  History of confusion.  History of altered gait.  CT HEAD WITHOUT CONTRAST  Technique: Contiguous axial images were obtained from the base of the skull through the vertex without contrast  Comparison:  None  Findings:  There is no evidence of brain mass, brain hemorrhage, or acute infarction.  The ventricular system is  normal size and shape.  There is no evidence of shift of midline structures, parenchymal lesion, or subdural or epidural hematoma.  The calvarium is intact.  Mastoids are well aerated.  There is mucosal thickening involving the medial aspect of the right maxillary sinus.  There is an oval opacity which may reflect a small mucous retention cyst or polyp.  No other significant mucosal thickening is seen in the paranasal sinuses.  No air fluid levels are seen.  No sinus expansion or bony destruction is evident.  IMPRESSION: There is no evidence of brain mass, brain hemorrhage, or acute infarction.  No acute or active brain process is seen.  No skull lesion is evident.  No sinusitis is evident.  There is mucosal thickening involving the medial aspect of the right maxillary sinus.  There is an oval opacity which may reflect a  small mucous retention cyst or polyp.   Original Report Authenticated By: Onalee Hua Call    MDM   1. Hyponatremia   2. Hypokalemia   3. Lactic acidosis   4. Pancreatitis     She arrived in the ER with complaints of nausea, vomiting, diarrhea and generalized weakness for approximately one week. He has not been experiencing abdominal pain. Patient is a chronic alcoholic, has had increased alcohol intake this week according to family. Patient was seen by primary doctor yesterday and had blood work performed. He was called today and told to go to the emergency department because of low blood counts as well as modalities. He was not sure what his levels were.  At arrival, patient appears distressed. He is very pallid, slow to respond. He does appear oriented, but there is definitely some element of mental status changes present. The family agrees that he is not acting like his normal self. He felt like his left eye was turned inwards, but my examination reveals intermittent disconjugate gaze, but normal extraocular motion with formal testing.  Patient's blood work showed a significant lactic  acidosis. With his nausea, vomiting and diarrhea, coupled with hypotension, tachycardia, sepsis is considered. All findings might be something metabolic infectious, but empiric antibiotic coverage is ordered. Patient is found to be profoundly hyponatremic and hypokalemic. Replacement has been initiated. Patient does have somewhat elevated lipase. Pancreatitis might be the cause of the majority of the patient's symptoms, although is not experiencing abdominal pain currently. CT scan of abdomen and pelvis has been ordered to further evaluate for complications of pancreatitis as well as other intra-abdominal pathology to explain the patient's acidosis.  Patient initiated on aggressive fluid resuscitation. Doctor Molli Knock, critical care medicine, has been consulted the patient will be evaluated in the ER for admission and management.  Gilda Crease, MD 08/30/12 [phone]

## 2012-08-30 NOTE — H&P (Signed)
PULMONARY  / CRITICAL CARE MEDICINE  Name: Eugene Jackson MRN: [id number] DOB: 1970/10/22    ADMISSION DATE:  08/30/2012  REFERRING MD : EDP PRIMARY SERVICE: PCCM  CHIEF COMPLAINT: N/V/D  BRIEF PATIENT DESCRIPTION: 22 M alcoholic with several days of N/V/D referred to ED by primary MD after multiple lab abnormalities found including hyponatremia, hypokalemia, pancytopenia, metabolic acidosis  SIGNIFICANT EVENTS / STUDIES:  8/28 CT head: NAD 8/28 CT abd/pelvis: pancolitis  LINES / TUBES:   CULTURES: Urine 8/28 >> UA NEG, not cultures Blood 8/28 >>   ANTIBIOTICS: Cipro 8/28 >>   HISTORY OF PRESENT ILLNESS:  Pt is a poor historian who defers to his niece to provide history. He has had a week or more of GI symptoms as above. He denies abdominal pain, hematemesis, hematochezia and melena. He has developed progressive weakness since the onset of this illness and was seen by his primary care MD who obtained labs on the day PTA. There were several critical abnormalities that led the primary MD to refer the patient to the ED. His niece indicates that he is a heavy drinker of alcohol and typically consumes a large bottle of vodka every 1-2 days. He denies dyspnea, CP, cough, sputum production, dysuria, LE edema and calf tenderness. He has been complaining of diplopia over the past few days. He underwent a L hand/wrist surgery in early June for which he continues to wear a wrist splint. He also suffered a nondisplaced R femur fracture in mid June after an alcohol-related fall and has been confined to a wheelchair since then  PAST MEDICAL HISTORY :  Past Medical History  Diagnosis Date  . Hypertension   . Broken femur     right  . PONV (postoperative nausea and vomiting)    Past Surgical History  Procedure Laterality Date  . Wrist surgery    . Joint replacement      bilateral hip replacement   Prior to Admission medications   Medication Sig Start Date End Date Taking? Authorizing  Provider  amLODipine (NORVASC) 10 MG tablet Take 10 mg by mouth daily.   Yes Historical Provider, MD  cyclobenzaprine (FLEXERIL) 10 MG tablet Take 10 mg by mouth daily.   Yes Historical Provider, MD  docusate sodium (COLACE) 100 MG capsule Take 100 mg by mouth 2 (two) times daily.   Yes Historical Provider, MD  methocarbamol (ROBAXIN) 500 MG tablet Take 500 mg by mouth 4 (four) times daily.   Yes Historical Provider, MD  oxyCODONE (OXY IR/ROXICODONE) 5 MG immediate release tablet Take 10 mg by mouth every 6 (six) hours as needed for pain.   Yes Historical Provider, MD  promethazine (PHENERGAN) 25 MG tablet Take 25 mg by mouth every 6 (six) hours as needed for nausea.   Yes Historical Provider, MD   No Known Allergies  FAMILY HISTORY:  History reviewed. No pertinent family history. SOCIAL HISTORY: Heavy alcohol 1-2 PPD No occupational exposures Unemployed  REVIEW OF SYSTEMS:  As per HPI  SUBJECTIVE:   VITAL SIGNS: Temp:  [98.2 F (36.8 C)-99.4 F (37.4 C)] 99.4 F (37.4 C) (08/28 2000) Pulse Rate:  [93-105] 93 (08/28 2000) Resp:  [14-22] 16 (08/28 2000) BP: (94-134)/(63-116) 98/76 mmHg (08/28 2000) SpO2:  [98 %-100 %] 100 % (08/28 2000) Weight:  [59.3 kg (130 lb 11.7 oz)-63.504 kg (140 lb)] 59.3 kg (130 lb 11.7 oz) (08/28 1900) HEMODYNAMICS:   VENTILATOR SETTINGS:   INTAKE / OUTPUT: Intake/Output     08/28 0701 -  08/29 0700   I.V. (mL/kg) 125 (2.1)   IV Piggyback 1450   Total Intake(mL/kg) 1575 (26.6)   Emesis/NG output 170   Stool 450   Total Output 620   Net +955       Urine Occurrence 1 x   Stool Occurrence 1 x   Emesis Occurrence 3 x     PHYSICAL EXAMINATION: General:  Poorly oriented, NAD, + F/C Neuro: MAEs with equal strength, sensory intact, DTRs diminished symmetrically, no asterixis HEENT: Mild sclericterus, dysconjugate gaze, EOMI, PERRL, OP benign Cardiovascular:  RRR s M Lungs:  Diffuse coarse wheezes and scattered rhonchi Abdomen:  Soft, NT,  hyperactive BS, liver edge approx 3 cm below costal margin, liver is not tender, Murphy's sign negative Ext: musculature normal, ext warm, no edema Skin: no lesions noted  LABS:  CBC Recent Labs     08/30/12  1500  WBC  2.6*  HGB  9.5*  HCT  25.1*  PLT  24*   Coag's Recent Labs     08/30/12  1500  INR  1.10   BMET Recent Labs     08/30/12  1500  NA  112*  K  2.0*  CL  <65*  CO2  23  BUN  33*  CREATININE  1.68*  GLUCOSE  127*   Electrolytes Recent Labs     08/30/12  1500  08/30/12  1630  CALCIUM  8.5   --   MG   --   1.3*   Sepsis Markers No results found for this basename: LACTICACIDVEN, PROCALCITON, O2SATVEN,  in the last 72 hours ABG Recent Labs     08/30/12  1710  PHART  7.600*  PCO2ART  29.4*  PO2ART  81.9   Liver Enzymes Recent Labs     08/30/12  1500  AST  51*  ALT  23  ALKPHOS  93  BILITOT  1.8*  ALBUMIN  3.3*   Cardiac Enzymes Recent Labs     08/30/12  1500  TROPONINI  <0.30   Glucose No results found for this basename: GLUCAP,  in the last 72 hours     CXR: NACPD  ASSESSMENT / PLAN:  PULMONARY A: Heavy smoker Bronchospasm Presumed COPD P:   Nebulized BDs Supplemental O2  CARDIOVASCULAR A: H/O hypertension P:  Monitor Holding amlodipine  RENAL A:  AKI - likely prerenal/hypovolemic Severe hyponatremia Severe hypokalemia Hypomagnesemia  Lactic acidosis - suspect due to liver disease P:   Monitor BMET intermittently Correct electrolytes as indicated Monitor acid-base - anticipate resolution of acidosis with resuscitation  GASTROINTESTINAL A:  Elevated LFTs Enlarged liver Elevated lipase - exam unimpressive for pancreatitis CT findings of pancolitis Severe nausea, vomiting Diarrhea P:  Abx as above Supportive care  HEMATOLOGIC A: Anemia without evidence of acute blood loss Thrombocytopenia without evidence of bleeding Relative leukopenia Suspected alcohol induced bone marrow toxicity P:   Transfuse per usual ICU guidelines Monitor CBC intermittently  INFECTIOUS A:  Suspected colitis Lactic acidosis without evidence of septic shock  Suspect due to liver disease P:   Abx and micro as above  ENDOCRINE A: Mild hyperglycemia Risk for hypoglycemia given liver and renal dysfunction P:   Monitor glucoses on chemistry panels No SSI ordered  NEUROLOGIC A:  Acute encephalopathy - possible Wernicke's High risk for DTs P:   CIWA protocol High dose thiamine  TODAY'S SUMMARY:   I have personally obtained a history, examined the patient, evaluated laboratory and imaging results, formulated the assessment and plan and  placed orders. CRITICAL CARE: The patient is critically ill with multiple organ systems failure and requires high complexity decision making for assessment and support, frequent evaluation and titration of therapies, application of advanced monitoring technologies and extensive interpretation of multiple databases. Critical Care Time devoted to patient care services described in this note is 35 minutes.    Pulmonary and Critical Care Medicine [facility] Pager: [phone]  08/30/2012, 8:49 PM

## 2012-08-30 NOTE — Progress Notes (Signed)
eLink Physician-Brief Progress Note Patient Name: ALBION WEATHERHOLTZ DOB: 1970-08-12 MRN: [id number]  Date of Service  08/30/2012   HPI/Events of Note     eICU Interventions  Clarified fluid orders hypomag-repleted   Intervention Category Intermediate Interventions: Electrolyte abnormality - evaluation and management  ALVA,RAKESH V. 08/30/2012, 7:51 PM

## 2012-08-30 NOTE — Progress Notes (Signed)
ANTIBIOTIC CONSULT NOTE - INITIAL  Pharmacy Consult for ciprofloxacin Indication: colitis  No Known Allergies  Patient Measurements: Height: 5\' 7"  (170.2 cm) Weight: 140 lb (63.504 kg) IBW/kg (Calculated) : 66.1  Vital Signs: Temp: 99 F (37.2 C) (08/28 1727) Temp src: Oral (08/28 1727) BP: 95/63 mmHg (08/28 1835) Pulse Rate: 98 (08/28 1835)  Labs:  Recent Labs  08/30/12 1500  WBC 2.6*  HGB 9.5*  PLT 24*  CREATININE 1.68*   Estimated Creatinine Clearance: 50.4 ml/min (by C-G formula based on Cr of 1.68).   Microbiology: 8/28 blood culture x2: pending 8/28 urine culture: ordered 8/28 C.diff PCR: ordered 8/28 MRSA PCR: pending  Anti-infectives: 8/28 vancomycin IV x1 8/28 Zosyn x1  Assessment: 42 yo presented 8/28 with c/o generalized weakness, SOB, N/V/D x 1 week and abnormal labs drawn by PCP office 8/27.   In ED, WBC 2.6K, Hgb 9.5, Plts 24K, Na+ 112, K+ 2, Cl- < 65, Scr 1.68, lactic acid elevated at 11.96, pt tachycardic. CT head and CXR with no acute findings. ?source of infection. Scr 1.68 for CG CrCl 50 ml/min and normalized CrCl of 57 ml/min  Code Sepsis called and then discontinued by CCM  abd CT on 8/28 showed infectious vs inflammatory colitis  Patient received 1 dose of vancomycin and 1 dose of Zosyn in the ED  Pharmacy has been consulted to dose ciprofloxacin   WBC low at 2.6, ANC 1.5  SCr elevated at 1.68, baseline appears to be ~0.8  Patient is currently afebrile  Metronidazole 500mg  IV q8h has also been ordered per MD  Will start Cipro as IV with immunocompromised status and acute issues  Goal of Therapy:  eradication of infection  Plan:  - start ciprofloxacin 400mg  IV q12h - continue Flagyl 500mg  IV q8h - follow-up clinical course, culture results, antibiotic length of therapy - follow-up change to PO formulation when clinically appropriate  Thank you for the consult.  Tomi Bamberger, PharmD Clinical Pharmacist Pager:  [phone] Pharmacy: [phone] 08/30/2012 7:39 PM

## 2012-08-31 ENCOUNTER — Inpatient Hospital Stay (HOSPITAL_COMMUNITY): Payer: No Typology Code available for payment source

## 2012-08-31 DIAGNOSIS — D696 Thrombocytopenia, unspecified: Secondary | ICD-10-CM | POA: Diagnosis present

## 2012-08-31 DIAGNOSIS — K859 Acute pancreatitis without necrosis or infection, unspecified: Secondary | ICD-10-CM

## 2012-08-31 DIAGNOSIS — A419 Sepsis, unspecified organism: Secondary | ICD-10-CM | POA: Diagnosis present

## 2012-08-31 DIAGNOSIS — D65 Disseminated intravascular coagulation [defibrination syndrome]: Secondary | ICD-10-CM | POA: Diagnosis present

## 2012-08-31 DIAGNOSIS — K709 Alcoholic liver disease, unspecified: Secondary | ICD-10-CM

## 2012-08-31 DIAGNOSIS — R748 Abnormal levels of other serum enzymes: Secondary | ICD-10-CM

## 2012-08-31 LAB — URINALYSIS, ROUTINE W REFLEX MICROSCOPIC
Hgb urine dipstick: NEGATIVE
Ketones, ur: NEGATIVE mg/dL
Nitrite: NEGATIVE
Specific Gravity, Urine: 1.033 — ABNORMAL HIGH (ref 1.005–1.030)
Urobilinogen, UA: 0.2 mg/dL (ref 0.0–1.0)

## 2012-08-31 LAB — BASIC METABOLIC PANEL
BUN: 26 mg/dL — ABNORMAL HIGH (ref 6–23)
BUN: 7 mg/dL (ref 6–23)
CO2: 30 mEq/L (ref 19–32)
CO2: 31 mEq/L (ref 19–32)
CO2: 30 mEq/L (ref 19–32)
Calcium: 7.1 mg/dL — ABNORMAL LOW (ref 8.4–10.5)
Calcium: 7.5 mg/dL — ABNORMAL LOW (ref 8.4–10.5)
Chloride: 76 mEq/L — ABNORMAL LOW (ref 96–112)
Chloride: 81 mEq/L — ABNORMAL LOW (ref 96–112)
Chloride: 89 mEq/L — ABNORMAL LOW (ref 96–112)
Chloride: 88 mEq/L — ABNORMAL LOW (ref 96–112)
GFR calc Af Amer: >90 mL/min (ref >90)
GFR calc Af Amer: >90 mL/min (ref >90)
GFR calc Af Amer: >90 mL/min (ref >90)
GFR calc non Af Amer: >90 mL/min (ref >90)
GFR calc non Af Amer: >90 mL/min (ref >90)
GFR calc non Af Amer: >90 mL/min (ref >90)
Glucose, Bld: 136 mg/dL — ABNORMAL HIGH (ref 70–99)
Glucose, Bld: 132 mg/dL — ABNORMAL HIGH (ref 70–99)
Glucose, Bld: 120 mg/dL — ABNORMAL HIGH (ref 70–99)
Glucose, Bld: 130 mg/dL — ABNORMAL HIGH (ref 70–99)
Potassium: 2.2 mEq/L — CL (ref 3.5–5.1)
Potassium: 2.8 mEq/L — ABNORMAL LOW (ref 3.5–5.1)
Potassium: 3.4 mEq/L — ABNORMAL LOW (ref 3.5–5.1)
Potassium: 3.7 mEq/L (ref 3.5–5.1)
Sodium: 115 mEq/L — CL (ref 135–145)
Sodium: 116 mEq/L — CL (ref 135–145)
Sodium: 121 mEq/L — ABNORMAL LOW (ref 135–145)
Sodium: 125 mEq/L — ABNORMAL LOW (ref 135–145)
Sodium: 123 mEq/L — ABNORMAL LOW (ref 135–145)

## 2012-08-31 LAB — CBC
HCT: 20.3 % — ABNORMAL LOW (ref 39.0–52.0)
Hemoglobin: 6.9 g/dL — CL (ref 13.0–17.0)
Hemoglobin: 7.7 g/dL — ABNORMAL LOW (ref 13.0–17.0)
MCV: 94.4 fL (ref 78.0–100.0)
Platelets: 12 10*3/uL — CL (ref 150–400)
RBC: 1.82 MIL/uL — ABNORMAL LOW (ref 4.22–5.81)
WBC: 1.5 10*3/uL — ABNORMAL LOW (ref 4.0–10.5)
WBC: 1.6 10*3/uL — ABNORMAL LOW (ref 4.0–10.5)

## 2012-08-31 LAB — CBC WITH DIFFERENTIAL/PLATELET
Basophils Absolute: 0 10*3/uL (ref 0.0–0.1)
Eosinophils Relative: 2 % (ref 0–5)
HCT: 18 % — ABNORMAL LOW (ref 39.0–52.0)
Lymphocytes Relative: 38 % (ref 12–46)
MCV: 95.7 fL (ref 78.0–100.0)
Monocytes Relative: 4 % (ref 3–12)
Neutro Abs: 1 10*3/uL — ABNORMAL LOW (ref 1.7–7.7)
Platelets: 14 10*3/uL — CL (ref 150–400)
RBC: 1.88 MIL/uL — ABNORMAL LOW (ref 4.22–5.81)
RDW: 16.7 % — ABNORMAL HIGH (ref 11.5–15.5)
WBC: 1.7 10*3/uL — ABNORMAL LOW (ref 4.0–10.5)

## 2012-08-31 LAB — DIC (DISSEMINATED INTRAVASCULAR COAGULATION)PANEL
Fibrinogen: 351 mg/dL (ref 204–475)
Smear Review: NONE SEEN

## 2012-08-31 LAB — HEPATIC FUNCTION PANEL
ALT: 18 U/L (ref 0–53)
AST: 54 U/L — ABNORMAL HIGH (ref 0–37)
Albumin: 2.5 g/dL — ABNORMAL LOW (ref 3.5–5.2)
Alkaline Phosphatase: 67 U/L (ref 39–117)
Total Protein: 4.9 g/dL — ABNORMAL LOW (ref 6.0–8.3)

## 2012-08-31 LAB — PHOSPHORUS: Phosphorus: 0.3 mg/dL — CL (ref 2.3–4.6)

## 2012-08-31 LAB — MAGNESIUM: Magnesium: 2.1 mg/dL (ref 1.5–2.5)

## 2012-08-31 LAB — PATHOLOGIST SMEAR REVIEW

## 2012-08-31 LAB — CLOSTRIDIUM DIFFICILE BY PCR: Toxigenic C. Difficile by PCR: NEGATIVE

## 2012-08-31 LAB — CORTISOL: Cortisol, Plasma: 21.9 ug/dL

## 2012-08-31 MED ORDER — BIOTENE DRY MOUTH MT LIQD
15.0000 mL | Freq: Two times a day (BID) | OROMUCOSAL | Status: DC
  Administered 2012-08-31 – 2012-09-07 (×13): 15 mL via OROMUCOSAL

## 2012-08-31 MED ORDER — SODIUM CHLORIDE 0.9 % IV BOLUS (SEPSIS)
500.0000 mL | Freq: Once | INTRAVENOUS | Status: AC
  Administered 2012-08-31: 500 mL via INTRAVENOUS

## 2012-08-31 MED ORDER — POTASSIUM CHLORIDE 10 MEQ/50ML IV SOLN
10.0000 meq | INTRAVENOUS | Status: AC
  Administered 2012-08-31 (×4): 10 meq via INTRAVENOUS
  Filled 2012-08-31 (×4): qty 50

## 2012-08-31 MED ORDER — POTASSIUM PHOSPHATE DIBASIC 3 MMOLE/ML IV SOLN
30.0000 mmol | Freq: Once | INTRAVENOUS | Status: AC
  Administered 2012-08-31: 30 mmol via INTRAVENOUS
  Filled 2012-08-31: qty 10

## 2012-08-31 MED ORDER — HYDROCORTISONE SOD SUCCINATE 100 MG IJ SOLR
50.0000 mg | Freq: Four times a day (QID) | INTRAMUSCULAR | Status: DC
  Administered 2012-08-31 – 2012-09-03 (×12): 50 mg via INTRAVENOUS
  Filled 2012-08-31: qty 2
  Filled 2012-08-31 (×2): qty 1
  Filled 2012-08-31: qty 2
  Filled 2012-08-31: qty 1
  Filled 2012-08-31: qty 2
  Filled 2012-08-31 (×4): qty 1
  Filled 2012-08-31: qty 2
  Filled 2012-08-31 (×5): qty 1

## 2012-08-31 MED ORDER — NOREPINEPHRINE BITARTRATE 1 MG/ML IJ SOLN
2.0000 ug/min | INTRAVENOUS | Status: DC
  Administered 2012-08-31 – 2012-09-01 (×2): 2 ug/min via INTRAVENOUS
  Filled 2012-08-31 (×2): qty 4

## 2012-08-31 MED ORDER — ENSURE COMPLETE PO LIQD
237.0000 mL | Freq: Two times a day (BID) | ORAL | Status: DC
  Administered 2012-09-03 – 2012-09-06 (×2): 237 mL via ORAL

## 2012-08-31 MED ORDER — POTASSIUM CHLORIDE 10 MEQ/100ML IV SOLN
10.0000 meq | INTRAVENOUS | Status: AC
  Administered 2012-08-31 (×6): 10 meq via INTRAVENOUS
  Filled 2012-08-31 (×6): qty 100

## 2012-08-31 MED ORDER — PROMETHAZINE HCL 25 MG/ML IJ SOLN
12.5000 mg | Freq: Once | INTRAMUSCULAR | Status: DC
  Filled 2012-08-31: qty 1

## 2012-08-31 NOTE — Progress Notes (Signed)
eLink Physician-Brief Progress Note Patient Name: Eugene Jackson DOB: 09/29/1970 MRN: [id number]  Date of Service  08/31/2012   HPI/Events of Note  Continued nausea despite high dose zofran.   eICU Interventions  Plan: One time dose of phenergan 12.5 mg IV    Intervention Category Minor Interventions: Routine modifications to care plan (e.g. PRN medications for pain, fever)  DETERDING,ELIZABETH 08/31/2012, 12:46 AM

## 2012-08-31 NOTE — Procedures (Signed)
Central Venous Catheter Insertion Procedure Note Eugene Jackson [id number] 1970-06-01  Procedure: Insertion of Central Venous Catheter Indications: Assessment of intravascular volume, Drug and/or fluid administration and Frequent blood sampling  Procedure Details Consent: Risks of procedure as well as the alternatives and risks of each were explained to the (patient/caregiver).  Consent for procedure obtained. Time Out: Verified patient identification, verified procedure, site/side was marked, verified correct patient position, special equipment/implants available, medications/allergies/relevent history reviewed, required imaging and test results available.  Performed  Maximum sterile technique was used including antiseptics, cap, gloves, gown, hand hygiene, mask and sheet. Skin prep: Chlorhexidine; local anesthetic administered A antimicrobial bonded/coated triple lumen catheter was placed to 15cm in the right internal jugular vein using the Seldinger technique.  Evaluation Blood flow good Complications: No apparent complications Patient did tolerate procedure well. Chest X-ray ordered to verify placement.  CXR: pending.   Procedure performed under direct supervision of Dr. Marchelle Gearing and with ultrasound guidance for real time vessel cannulation.     Eugene Brim, NP-C [facility] Pgr: [phone] or [phone]    08/31/2012, 12:31 PM

## 2012-08-31 NOTE — Progress Notes (Signed)
Met with patient, father, and sister. Offered support. Patient denied needing support, but will continue to follow for family. Father is from [place] and sister is from [place] near [place]. Presence; listening.

## 2012-08-31 NOTE — Progress Notes (Signed)
eLink Physician-Brief Progress Note Patient Name: Eugene Jackson DOB: Jul 15, 1970 MRN: [id number]  Date of Service  08/31/2012   HPI/Events of Note  BP of 83/46 (56).   eICU Interventions  Plan: 500 cc NS bolus for hypotension   Intervention Category Intermediate Interventions: Hypotension - evaluation and management  Wilkie Zenon 08/31/2012, 3:32 AM

## 2012-08-31 NOTE — Progress Notes (Signed)
INITIAL NUTRITION ASSESSMENT  DOCUMENTATION CODES Per approved criteria  -Not Applicable   INTERVENTION: Provide Ensure Complete BID Multivitamin with minerals, thiamine, and folic acid  NUTRITION DIAGNOSIS: Predicted suboptimal intake related to nausea, vomiting, and mental status as evidenced by pt sleeping through meals.   Goal: Pt to meet >/= 90% of their estimated nutrition needs   Monitor:  PO intake Weight Labs  Reason for Assessment: Malnutrition Screening Tool, score of 2  42 y.o. male  Admitting Dx:   ASSESSMENT: 42 year old male with history of hypertension who presents with complaints of nausea, vomiting, and diarrhea for the past several days. He has developed progressive weakness since the onset of this illness and was seen by his primary care MD who obtained labs on the day PTA. There were several critical abnormalities that led the primary MD to refer the patient to the ED. His niece indicates that he is a heavy drinker of alcohol and typically consumes a large bottle of vodka every 1-2 days. He suffered a nondisplaced R femur fracture in mid June after an alcohol-related fall and has been confined to a wheelchair since then.  Per multidisciplinary rounds pt is nonverbal today. RN reports pt has been asleep, difficult to arouse, and has not had anything to eat or drink. Per pt's father in the room, pt was not eating well PTA. Per [facility] report, pt claimed to have lost 5- 10 lbs in the past few weeks. Per MD note, pt is at risk for intubation due to mental status.    Height: Ht Readings from Last 1 Encounters:  08/30/12 5\' 7"  (1.702 m)    Weight: Wt Readings from Last 1 Encounters:  08/31/12 134 lb 11.2 oz (61.1 kg)    Ideal Body Weight: 148 lbs  % Ideal Body Weight: 90%  Wt Readings from Last 10 Encounters:  08/31/12 134 lb 11.2 oz (61.1 kg)    Usual Body Weight: unknown  % Usual Body Weight: NA  BMI:  Body mass index is 21.09 kg/(m^2).  Estimated  Nutritional Needs: Kcal: 1800-2000 Protein: 70-80 grams Fluid: 2.1 L  Skin: intact  Diet Order: General  EDUCATION NEEDS: -No education needs identified at this time   Intake/Output Summary (Last 24 hours) at 08/31/12 1248 Last data filed at 08/31/12 1200  Gross per 24 hour  Intake 5606.25 ml  Output   1970 ml  Net 3636.25 ml    Last BM: 8/29  Labs:   Recent Labs Lab 08/30/12 1500 08/30/12 1630 08/30/12 2307 08/31/12 0335 08/31/12 1100  NA 112*  --  115* 116* 121*  K 2.0*  --  2.2* 2.9* 3.0*  CL <65*  --  76* 81* 89*  CO2 23  --  30 31 28   BUN 33*  --  26* 21 12  CREATININE 1.68*  --  0.87 0.68 0.52  CALCIUM 8.5  --  7.0* 7.2* 7.1*  MG  --  1.3*  --   --  2.1  PHOS  --   --   --   --  0.3*  GLUCOSE 127*  --  136* 132* 120*    CBG (last 3)  No results found for this basename: GLUCAP,  in the last 72 hours  Scheduled Meds: . ipratropium  0.5 mg Nebulization Q6H   And  . albuterol  2.5 mg Nebulization Q6H  . ciprofloxacin  400 mg Intravenous Q12H  . folic acid (FOLVITE) IVPB  1 mg Intravenous Daily  . hydrocortisone  sod succinate (SOLU-CORTEF) inj  50 mg Intravenous Q6H  . LORazepam  0-4 mg Intravenous Q3H   Followed by  . [START ON 09/01/2012] LORazepam  0-4 mg Intravenous Q6H  . metronidazole  500 mg Intravenous Q8H  . multivitamin with minerals  1 tablet Oral Daily  . pantoprazole (PROTONIX) IV  40 mg Intravenous QHS  . potassium chloride  10 mEq Intravenous Q1 Hr x 4  . potassium phosphate IVPB (mmol)  30 mmol Intravenous Once  . promethazine  12.5 mg Intravenous Once  . thiamine  500 mg Oral Daily   Or  . thiamine  500 mg Intravenous Daily    Continuous Infusions: . dextrose 5 % and 0.9 % NaCl with KCl 40 mEq/L Stopped (08/31/12 1000)  . norepinephrine (LEVOPHED) Adult infusion      Past Medical History  Diagnosis Date  . Hypertension   . Broken femur     right  . PONV (postoperative nausea and vomiting)     Past Surgical History   Procedure Laterality Date  . Wrist surgery    . Joint replacement      bilateral hip replacement    Ian Malkin RD, LDN Inpatient Clinical Dietitian Pager: [phone] After Hours Pager: [phone]

## 2012-08-31 NOTE — Procedures (Signed)
Dr. Kalman Shan, M.D., [facility].C.P Pulmonary and Critical Care Medicine Staff Physician [facility] Pulmonary and Critical Care Pager: [phone], If no answer or between  15:00h - 7:00h: call [phone]  08/31/2012 3:28 PM

## 2012-08-31 NOTE — Progress Notes (Signed)
eLink Physician-Brief Progress Note Patient Name: ISAIR INABINET DOB: 05/08/1970 MRN: [id number]  Date of Service  08/31/2012   HPI/Events of Note  Multiple loose stools - C Diff pending   eICU Interventions  Plan: Order for rectal tube   Intervention Category Minor Interventions: Routine modifications to care plan (e.g. PRN medications for pain, fever)  DETERDING,ELIZABETH 08/31/2012, 12:11 AM

## 2012-08-31 NOTE — Progress Notes (Signed)
Notified elink of pt's midnight labwork.  Hemoglobin 7.0, Sodium 115, Potassium 2.2 and platelets 14.0.  Orders received.

## 2012-08-31 NOTE — H&P (Signed)
PULMONARY  / CRITICAL CARE MEDICINE  Name: Eugene Jackson MRN: [id number] DOB: 10-07-1970    ADMISSION DATE:  08/30/2012  REFERRING MD : EDP PRIMARY SERVICE: PCCM  CHIEF COMPLAINT: N/V/D  BRIEF PATIENT DESCRIPTION:  Pt is a poor historian who defers to his niece to provide history. He has had a week or more of GI symptoms as above. He denies abdominal pain, hematemesis, hematochezia and melena. He has developed progressive weakness since the onset of this illness and was seen by his primary care MD who obtained labs on the day PTA. There were several critical abnormalities that led the primary MD to refer the patient to the ED. His niece indicates that he is a heavy drinker of alcohol and typically consumes a large bottle of vodka every 1-2 days. He denies dyspnea, CP, cough, sputum production, dysuria, LE edema and calf tenderness. He has been complaining of diplopia over the past few days. He underwent a L hand/wrist surgery in early June for which he continues to wear a wrist splint. He also suffered a nondisplaced R femur fracture in mid June after an alcohol-related fall and has been confined to a wheelchair since then   LINES / TUBES:   CULTURES: Urine 8/28 >> UA NEG, not cultures Blood 8/28 >>   Results for orders placed during the hospital encounter of 08/30/12  CULTURE, BLOOD (ROUTINE X 2)     Status: None   Collection Time    08/30/12  4:39 PM      Result Value Range Status   Specimen Description BLOOD LEFT ARM   Final   Special Requests BOTTLES DRAWN AEROBIC AND ANAEROBIC [facility]   Final   Culture  Setup Time     Final   Value: 08/30/2012 22:18     Performed at [facility]   Culture     Final   Value:        BLOOD CULTURE RECEIVED NO GROWTH TO DATE CULTURE WILL BE HELD FOR 5 DAYS BEFORE ISSUING A FINAL NEGATIVE REPORT     Performed at [facility]   Report Status PENDING   Incomplete  CULTURE, BLOOD (ROUTINE X 2)     Status: None   Collection Time   08/30/12  4:44 PM      Result Value Range Status   Specimen Description BLOOD RIGHT ARM   Final   Special Requests BOTTLES DRAWN AEROBIC AND ANAEROBIC 6CC   Final   Culture  Setup Time     Final   Value: 08/30/2012 22:18     Performed at [facility]   Culture     Final   Value:        BLOOD CULTURE RECEIVED NO GROWTH TO DATE CULTURE WILL BE HELD FOR 5 DAYS BEFORE ISSUING A FINAL NEGATIVE REPORT     Performed at [facility]   Report Status PENDING   Incomplete  MRSA PCR SCREENING     Status: None   Collection Time    08/30/12  6:35 PM      Result Value Range Status   MRSA by PCR NEGATIVE  NEGATIVE Final   Comment:            The GeneXpert MRSA Assay (FDA     approved for NASAL specimens     only), is one component of a     comprehensive MRSA colonization     surveillance program. It is not     intended to  diagnose MRSA     infection nor to guide or     monitor treatment for     MRSA infections.  CLOSTRIDIUM DIFFICILE BY PCR     Status: None   Collection Time    08/31/12  3:01 AM      Result Value Range Status   C difficile by pcr NEGATIVE  NEGATIVE Final   Comment: Performed at [facility]     ANTIBIOTICS: Cipro 8/28 >>  Anti-infectives   Start     Dose/Rate Route Frequency Ordered Stop   08/31/12 0600  vancomycin (VANCOCIN) 500 mg in sodium chloride 0.9 % 100 mL IVPB  Status:  Discontinued     500 mg 100 mL/hr over 60 Minutes Intravenous Every 12 hours 08/30/12 1645 08/30/12 1709   08/31/12 0200  piperacillin-tazobactam (ZOSYN) IVPB 3.375 g  Status:  Discontinued     3.375 g 12.5 mL/hr over 240 Minutes Intravenous Every 8 hours 08/30/12 1645 08/30/12 1709   08/30/12 2000  metroNIDAZOLE (FLAGYL) IVPB 500 mg     500 mg 100 mL/hr over 60 Minutes Intravenous Every 8 hours 08/30/12 1929     08/30/12 2000  ciprofloxacin (CIPRO) IVPB 400 mg     400 mg 200 mL/hr over 60 Minutes Intravenous Every 12 hours 08/30/12 1942     08/30/12 1700   piperacillin-tazobactam (ZOSYN) IVPB 3.375 g  Status:  Discontinued     3.375 g 100 mL/hr over 30 Minutes Intravenous  Once 08/30/12 1619 08/30/12 1709   08/30/12 1700  vancomycin (VANCOCIN) IVPB 1000 mg/200 mL premix  Status:  Discontinued     1,000 mg 200 mL/hr over 60 Minutes Intravenous  Once 08/30/12 1619 08/30/12 1709       SIGNIFICANT EVENTS / STUDIES:  8/28 CT head: NAD 8/28 CT abd/pelvis: pancolitis     SUBJECTIVE/OVERNIGHT/INTERVAL HX 08/31/12 - Na correction in 12h as of 4am today (112-> 116) and then anohter in 7h as of 11am (121); total in 20h. Getting D5Normal saline at 125 ml/h/   BP MAP 66 with sBP 80/90s. PCT suggesting sepsis  Continues to be lethargic but arousable. RN says lethargic since admission.   Profound drop in platelets this morning hemoglobin low  and other electrolytes    VITAL SIGNS: Temp:  [98.2 F (36.8 C)-100.1 F (37.8 C)] 99.8 F (37.7 C) (08/29 1200) Pulse Rate:  [76-105] 80 (08/29 1200) Resp:  [13-22] 16 (08/29 1200) BP: (78-134)/(44-116) 89/53 mmHg (08/29 1200) SpO2:  [95 %-100 %] 100 % (08/29 1200) Weight:  [59.3 kg (130 lb 11.7 oz)-63.504 kg (140 lb)] 61.1 kg (134 lb 11.2 oz) (08/29 0500) HEMODYNAMICS:   VENTILATOR SETTINGS:   INTAKE / OUTPUT: Intake/Output     08/28 0701 - 08/29 0700 08/29 0701 - 08/30 0700   I.V. (mL/kg) 1475 (24.1) 125 (2)   Blood 56.3 860   Other 40    IV Piggyback 2550 200   Total Intake(mL/kg) 4121.3 (67.5) 1185 (19.4)   Urine (mL/kg/hr) 250    Emesis/NG output 270    Stool 950    Total Output 1470     Net +2651.3 +1185        Urine Occurrence 3 x    Stool Occurrence 3 x    Emesis Occurrence 5 x      PHYSICAL EXAMINATION: General:  Poorly oriented, NAD, + F/C. LOOKS DRY Neuro: RASS -1/-2 equivalent.  MAEs with equal strength, sensory intact, DTRs diminished symmetrically, no asterixis HEENT: Mild  sclericterus, dysconjugate gaze, EOMI, PERRL, OP benign Cardiovascular:  RRR s  M Lungs:  Diffuse coarse wheezes and scattered rhonchi Abdomen:  Soft, NT, hyperactive BS, liver edge approx 3 cm below costal margin, liver is not tender, Murphy's sign negative Ext: musculature normal, ext warm, no edema Skin: no lesions noted  LABS: PULMONARY  Recent Labs Lab 08/30/12 1710  PHART 7.600*  PCO2ART 29.4*  PO2ART 81.9  HCO3 29.0*  TCO2 26.3  O2SAT 96.9    CBC  Recent Labs Lab 08/30/12 1500 08/30/12 2307 08/31/12 0335  HGB 9.5* 7.0* 6.9*  HCT 25.1* 18.0* 17.6*  WBC 2.6* 1.7* 1.5*  PLT 24* 14* 12*    COAGULATION  Recent Labs Lab 08/30/12 1500  INR 1.10    CARDIAC   Recent Labs Lab 08/30/12 1500  TROPONINI <0.30   No results found for this basename: PROBNP,  in the last 168 hours   CHEMISTRY  Recent Labs Lab 08/30/12 1500 08/30/12 1630 08/30/12 2307 08/31/12 0335 08/31/12 1100  NA 112*  --  115* 116* 121*  K 2.0*  --  2.2* 2.9* 3.0*  CL <65*  --  76* 81* 89*  CO2 23  --  30 31 28   GLUCOSE 127*  --  136* 132* 120*  BUN 33*  --  26* 21 12  CREATININE 1.68*  --  0.87 0.68 0.52  CALCIUM 8.5  --  7.0* 7.2* 7.1*  MG  --  1.3*  --   --  2.1  PHOS  --   --   --   --  0.3*   Estimated Creatinine Clearance: 101.8 ml/min (by C-G formula based on Cr of 0.52).   LIVER  Recent Labs Lab 08/30/12 1500 08/31/12 0335  AST 51* 54*  ALT 23 18  ALKPHOS 93 67  BILITOT 1.8* 1.4*  PROT 6.6 4.9*  ALBUMIN 3.3* 2.5*  INR 1.10  --      INFECTIOUS  Recent Labs Lab 08/30/12 1548 08/30/12 2307  LATICACIDVEN 11.96* 1.1  PROCALCITON  --  5.34     ENDOCRINE CBG (last 3)  No results found for this basename: GLUCAP,  in the last 72 hours       IMAGING x48h  Dg Chest 1 View  08/30/2012   *RADIOLOGY REPORT*  Clinical Data: Dizziness  CHEST - 1 VIEW  Comparison: 12/12/2006  Findings: Cardiomediastinal silhouette is stable.  No acute infiltrate or pleural effusion.  No pulmonary edema.  IMPRESSION: No active disease.  No  significant change.   Original Report Authenticated By: Natasha Mead, M.D.   Ct Head Wo Contrast  08/30/2012   *RADIOLOGY REPORT*  Clinical Data:  History of confusion.  History of altered gait.  CT HEAD WITHOUT CONTRAST  Technique: Contiguous axial images were obtained from the base of the skull through the vertex without contrast  Comparison:  None  Findings:  There is no evidence of brain mass, brain hemorrhage, or acute infarction.  The ventricular system is normal size and shape.  There is no evidence of shift of midline structures, parenchymal lesion, or subdural or epidural hematoma.  The calvarium is intact.  Mastoids are well aerated.  There is mucosal thickening involving the medial aspect of the right maxillary sinus.  There is an oval opacity which may reflect a small mucous retention cyst or polyp.  No other significant mucosal thickening is seen in the paranasal sinuses.  No air fluid levels are seen.  No sinus expansion or bony destruction is  evident.  IMPRESSION: There is no evidence of brain mass, brain hemorrhage, or acute infarction.  No acute or active brain process is seen.  No skull lesion is evident.  No sinusitis is evident.  There is mucosal thickening involving the medial aspect of the right maxillary sinus.  There is an oval opacity which may reflect a small mucous retention cyst or polyp.   Original Report Authenticated By: Onalee Hua Call   Ct Abdomen Pelvis W Contrast  08/30/2012   *RADIOLOGY REPORT*  Clinical Data: Sepsis, vomiting, diarrhea  CT ABDOMEN AND PELVIS WITH CONTRAST  Technique:  Multidetector CT imaging of the abdomen and pelvis was performed following the standard protocol during bolus administration of intravenous contrast.  Contrast: 80 ml Omnipaque-300 IV  Comparison: None.  Findings: Lung bases are essentially clear.  Moderate hepatic steatosis with focal fatty sparing in the gallbladder fossa.  Gallbladder is distended but without associated inflammatory changes.  No  intrahepatic or extrahepatic ductal dilatation.  Kidneys are within normal limits.  No hydronephrosis.  No evidence of bowel obstruction.  Normal appendix.  Wall thickening involving the majority of the colon and possibly the terminal ileum, suggesting infectious/inflammatory enterocolitis. Sigmoid colon and rectum are fluid-filled but otherwise relatively spared.  No drainable fluid collection/abscess.  No free air.  Atherosclerotic calcifications of the abdominal aorta and branch vessels.  No abdominopelvic ascites.  No suspicious abdominopelvic lymphadenopathy.  Prostate and bladder are obscured by streak artifact.  Bilateral total hip arthroplasties.  Degenerative changes of the visualized thoracolumbar spine.  IMPRESSION: Wall thickening involving the majority of the colon and possibly the terminal ileum, worrisome for infectious/inflammatory enterocolitis.  Rectum and sigmoid colon are relatively spared.  No drainable fluid collection/abscess.  No free air.   Original Report Authenticated By: Charline Bills, M.D.        ASSESSMENT / PLAN:  PULMONARY A: Heavy smoker Bronchospasm Presumed COPD  08/31/12 =- at risk for intubation due to mental status  P:   Nebulized BDs Supplemental O2 Monitor   CARDIOVASCULAR A: H/O hypertension  08/31/12 - BP soft  P:  Levophed for mAP > 65 CVP goal > 8 (CAREFUL WITH VOLUME BOLUSES DUE TO RAPID CORRECTION OF LOW NA and RISk Oof DEMYLENAION) Monitor Holding amlodipine  RENAL A:  AKI - likely prerenal/hypovolemic - resolved Lactic acidosis - resolved  08/31/12   - Low Phos  - severe  - Low NA - severe, persists with CNS symptoms. Corrected 112 -> 121 ( ) in 20h as of 11am 08/31/12   P:   DC Normal saline - change to [facility] Goal Na should be  -   < 124 meq at 3pm 08/31/12  < 126 meq at 9 pm 08/31/12   < 127 meq at 3 am 09/01/12  < 130 meq at 3pm 09/01/12 IF Correction, going faster than this ADD DESMOPRESSIN  (1 to 2 mcg intravenously or  subcutaneously every eight hours for 24 to 48 hours)  Monitor BMET intermittently Q4h Correct electrolytes as indicated Monitor acid-base - anticipate resolution of acidosis with resuscitation  GASTROINTESTINAL A:  Elevated LFTs Enlarged liver Elevated lipase - exam unimpressive for pancreatitis CT findings of pancolitis Severe nausea, vomiting Diarrhea  08/31/12 - diarrhea better  P:  Abx as above Supportive care  HEMATOLOGIC A: Anemia without evidence of acute blood loss Thrombocytopenia without evidence of bleeding Relative leukopenia Suspected alcohol induced bone marrow toxicity  08/31/12 - worsening anemia and platelets but LFT and renal picture not suggestive of TTP/HUS  P:  Transfuse per usual ICU guidelines; PRBC and Platelet today Monitor CBC intermittently Check dic panel and peripheram smear  INFECTIOUS A:  Suspected colitis Lactic acidosis without evidence of septic shock  Suspect due to liver disease P:   Abx and micro as above Levophed  ENDOCRINE A: Mild hyperglycemia Risk for hypoglycemia given liver and renal dysfunction P:   Monitor glucoses on chemistry panels No SSI ordered  NEUROLOGIC A:  Acute encephalopathy - possible Wernicke's High risk for DTs  08/31/12 - Lethargic  P:   CIWA protocol High dose thiamine Use  precedex AVOID RAPID CORRECTION OF SODIUM  GLOBAL 08/31/12 - sister updated. Spiritual support +   The patient is critically ill with multiple organ systems failure and requires high complexity decision making for assessment and support, frequent evaluation and titration of therapies, application of advanced monitoring technologies and extensive interpretation of multiple databases.   Critical Care Time devoted to patient care services described in this note is  105  Minutesexcluding procedure and NP tme  Dr. Kalman Shan, M.D., [facility].C.P Pulmonary and Critical Care Medicine Staff Physician [facility] [place]  Pulmonary and Critical Care Pager: [phone], If no answer or between  15:00h - 7:00h: call [phone]  08/31/2012 12:46 PM

## 2012-08-31 NOTE — Progress Notes (Signed)
CRITICAL VALUE ALERT  Critical value received: 6.9  Date of notification:  08/31/12  Time of notification:  0352  Critical value read back: yes  Nurse who received alert: Marcelino Duster   MD notified (1st page):  [phone]  Time of first page:  [phone]  MD notified (2nd page):none  Time of second page:  None needed  Responding MD:  Dr. Darrick Penna  Time MD responded:  [phone]

## 2012-08-31 NOTE — Progress Notes (Signed)
eLink Physician-Brief Progress Note Patient Name: Eugene Jackson DOB: 12-19-1970 MRN: [id number]  Date of Service  08/31/2012   HPI/Events of Note   Hgb of 6.9 down from 9.5.  Thrombocytopenia but no active bleeding noted.  Is hypotensive.  eICU Interventions  Plan: Transfuse 1 unit of pRBC Post transfusion CBC   Intervention Category Intermediate Interventions: Bleeding - evaluation and treatment with blood products  DETERDING,ELIZABETH 08/31/2012, 4:51 AM

## 2012-09-01 LAB — BASIC METABOLIC PANEL
BUN: 5 mg/dL — ABNORMAL LOW (ref 6–23)
BUN: 4 mg/dL — ABNORMAL LOW (ref 6–23)
BUN: 3 mg/dL — ABNORMAL LOW (ref 6–23)
BUN: <3 mg/dL — ABNORMAL LOW (ref 6–23)
CO2: 27 mEq/L (ref 19–32)
CO2: 28 mEq/L (ref 19–32)
CO2: 28 mEq/L (ref 19–32)
CO2: 27 mEq/L (ref 19–32)
CO2: 27 mEq/L (ref 19–32)
Calcium: 7.2 mg/dL — ABNORMAL LOW (ref 8.4–10.5)
Chloride: 90 mEq/L — ABNORMAL LOW (ref 96–112)
Chloride: 89 mEq/L — ABNORMAL LOW (ref 96–112)
Chloride: 93 mEq/L — ABNORMAL LOW (ref 96–112)
Chloride: 91 mEq/L — ABNORMAL LOW (ref 96–112)
Creatinine, Ser: 0.4 mg/dL — ABNORMAL LOW (ref 0.50–1.35)
Creatinine, Ser: 0.45 mg/dL — ABNORMAL LOW (ref 0.50–1.35)
GFR calc non Af Amer: >90 mL/min (ref >90)
Glucose, Bld: 175 mg/dL — ABNORMAL HIGH (ref 70–99)
Glucose, Bld: 157 mg/dL — ABNORMAL HIGH (ref 70–99)
Potassium: 3.7 mEq/L (ref 3.5–5.1)
Potassium: 3.1 mEq/L — ABNORMAL LOW (ref 3.5–5.1)
Potassium: 3 mEq/L — ABNORMAL LOW (ref 3.5–5.1)
Sodium: 126 mEq/L — ABNORMAL LOW (ref 135–145)
Sodium: 128 mEq/L — ABNORMAL LOW (ref 135–145)

## 2012-09-01 LAB — PREPARE PLATELET PHERESIS: Unit division: 0

## 2012-09-01 LAB — URINE CULTURE
Colony Count: NO GROWTH
Culture: NO GROWTH

## 2012-09-01 LAB — PHOSPHORUS: Phosphorus: 1.4 mg/dL — ABNORMAL LOW (ref 2.3–4.6)

## 2012-09-01 MED ORDER — POTASSIUM PHOSPHATE DIBASIC 3 MMOLE/ML IV SOLN
30.0000 mmol | Freq: Once | INTRAVENOUS | Status: AC
  Administered 2012-09-01: 30 mmol via INTRAVENOUS
  Filled 2012-09-01: qty 10

## 2012-09-01 MED ORDER — PROMETHAZINE HCL 25 MG/ML IJ SOLN
12.5000 mg | Freq: Four times a day (QID) | INTRAMUSCULAR | Status: DC | PRN
  Administered 2012-09-01 – 2012-09-02 (×2): 12.5 mg via INTRAVENOUS
  Filled 2012-09-01: qty 1

## 2012-09-01 MED ORDER — POTASSIUM CHLORIDE 10 MEQ/50ML IV SOLN
10.0000 meq | INTRAVENOUS | Status: AC
  Administered 2012-09-01 (×4): 10 meq via INTRAVENOUS
  Filled 2012-09-01: qty 200

## 2012-09-01 MED ORDER — CHLORDIAZEPOXIDE HCL 25 MG PO CAPS
25.0000 mg | ORAL_CAPSULE | Freq: Four times a day (QID) | ORAL | Status: DC
Start: 2012-09-01 — End: 2012-09-01

## 2012-09-01 MED ORDER — FAMOTIDINE 20 MG PO TABS
20.0000 mg | ORAL_TABLET | Freq: Every day | ORAL | Status: DC
  Administered 2012-09-01 – 2012-09-06 (×6): 20 mg via ORAL
  Filled 2012-09-01 (×7): qty 1

## 2012-09-01 NOTE — Progress Notes (Signed)
eLink Physician-Brief Progress Note Patient Name: Eugene Jackson DOB: [birth year] MRN: [id number]  Date of Service  09/01/2012   HPI/Events of Note   Hypokalemia  eICU Interventions  Potassium replaced   Intervention Category Minor Interventions: Electrolytes abnormality - evaluation and management  DETERDING,ELIZABETH 09/01/2012, 4:00 AM

## 2012-09-01 NOTE — Progress Notes (Signed)
PULMONARY  / CRITICAL CARE MEDICINE  Name: Eugene Jackson MRN: [id number] DOB: 10-Jan-1970    ADMISSION DATE:  08/30/2012  REFERRING MD : EDP PRIMARY SERVICE: PCCM  CHIEF COMPLAINT: N/V/D  BRIEF PATIENT DESCRIPTION:  Pt is a poor historian who defers to his niece to provide history. He has had a week or more of GI symptoms as above. He denies abdominal pain, hematemesis, hematochezia and melena. He has developed progressive weakness since the onset of this illness and was seen by his primary care MD who obtained labs on the day PTA. There were several critical abnormalities that led the primary MD to refer the patient to the ED. His niece indicates that he is a heavy drinker of alcohol and typically consumes a large bottle of vodka every 1-2 days. He denies dyspnea, CP, cough, sputum production, dysuria, LE edema and calf tenderness. He has been complaining of diplopia over the past few days. He underwent a L hand/wrist surgery in early June for which he continues to wear a wrist splint. He also suffered a nondisplaced R femur fracture in mid June after an alcohol-related fall and has been confined to a wheelchair since then   LINES / TUBES:   CULTURES: Urine 8/28 >> UA NEG, not cultures Blood 8/28 >>   Results for orders placed during the hospital encounter of 08/30/12  CULTURE, BLOOD (ROUTINE X 2)     Status: None   Collection Time    08/30/12  4:39 PM      Result Value Range Status   Specimen Description BLOOD LEFT ARM   Final   Special Requests BOTTLES DRAWN AEROBIC AND ANAEROBIC [facility]   Final   Culture  Setup Time     Final   Value: 08/30/2012 22:18     Performed at [facility]   Culture     Final   Value:        BLOOD CULTURE RECEIVED NO GROWTH TO DATE CULTURE WILL BE HELD FOR 5 DAYS BEFORE ISSUING A FINAL NEGATIVE REPORT     Performed at [facility]   Report Status PENDING   Incomplete  CULTURE, BLOOD (ROUTINE X 2)     Status: None   Collection Time     08/30/12  4:44 PM      Result Value Range Status   Specimen Description BLOOD RIGHT ARM   Final   Special Requests BOTTLES DRAWN AEROBIC AND ANAEROBIC 6CC   Final   Culture  Setup Time     Final   Value: 08/30/2012 22:18     Performed at [facility]   Culture     Final   Value:        BLOOD CULTURE RECEIVED NO GROWTH TO DATE CULTURE WILL BE HELD FOR 5 DAYS BEFORE ISSUING A FINAL NEGATIVE REPORT     Performed at [facility]   Report Status PENDING   Incomplete  MRSA PCR SCREENING     Status: None   Collection Time    08/30/12  6:35 PM      Result Value Range Status   MRSA by PCR NEGATIVE  NEGATIVE Final   Comment:            The GeneXpert MRSA Assay (FDA     approved for NASAL specimens     only), is one component of a     comprehensive MRSA colonization     surveillance program. It is not  intended to diagnose MRSA     infection nor to guide or     monitor treatment for     MRSA infections.  URINE CULTURE     Status: None   Collection Time    08/31/12  3:01 AM      Result Value Range Status   Specimen Description URINE, RANDOM   Final   Special Requests NONE   Final   Culture  Setup Time     Final   Value: 08/31/2012 08:36     Performed at [facility]   Colony Count     Final   Value: NO GROWTH     Performed at [facility]   Culture     Final   Value: NO GROWTH     Performed at [facility]   Report Status 09/01/2012 FINAL   Final  CLOSTRIDIUM DIFFICILE BY PCR     Status: None   Collection Time    08/31/12  3:01 AM      Result Value Range Status   C difficile by pcr NEGATIVE  NEGATIVE Final   Comment: Performed at [facility]     ANTIBIOTICS: Cipro 8/28 >>  Anti-infectives   Start     Dose/Rate Route Frequency Ordered Stop   08/31/12 0600  vancomycin (VANCOCIN) 500 mg in sodium chloride 0.9 % 100 mL IVPB  Status:  Discontinued     500 mg 100 mL/hr over 60 Minutes Intravenous Every 12 hours 08/30/12 1645  08/30/12 1709   08/31/12 0200  piperacillin-tazobactam (ZOSYN) IVPB 3.375 g  Status:  Discontinued     3.375 g 12.5 mL/hr over 240 Minutes Intravenous Every 8 hours 08/30/12 1645 08/30/12 1709   08/30/12 2000  metroNIDAZOLE (FLAGYL) IVPB 500 mg     500 mg 100 mL/hr over 60 Minutes Intravenous Every 8 hours 08/30/12 1929     08/30/12 2000  ciprofloxacin (CIPRO) IVPB 400 mg     400 mg 200 mL/hr over 60 Minutes Intravenous Every 12 hours 08/30/12 1942     08/30/12 1700  piperacillin-tazobactam (ZOSYN) IVPB 3.375 g  Status:  Discontinued     3.375 g 100 mL/hr over 30 Minutes Intravenous  Once 08/30/12 1619 08/30/12 1709   08/30/12 1700  vancomycin (VANCOCIN) IVPB 1000 mg/200 mL premix  Status:  Discontinued     1,000 mg 200 mL/hr over 60 Minutes Intravenous  Once 08/30/12 1619 08/30/12 1709       SIGNIFICANT EVENTS / STUDIES:  8/28 CT head: NAD 8/28 CT abd/pelvis: pancolitis   08/31/12 - Na correction in 12h as of 4am today (112-> 116) and then anohter in 7h as of 11am (121); total in 20h. Getting D5Normal saline at 125 ml/h/ .  BP MAP 66 with sBP 80/90s. PCT suggesting sepsis. Continues to be lethargic but arousable. RN says lethargic since admission. Profound drop in platelets this morning hemoglobin low and other electrolytes    SUBJECTIVE/OVERNIGHT/INTERVAL HX 09/01/12 - improved mental status. On levophed    VITAL SIGNS: Temp:  [98.6 F (37 C)-99.8 F (37.7 C)] 98.6 F (37 C) (08/30 0800) Pulse Rate:  [66-86] 81 (08/30 1100) Resp:  [12-24] 17 (08/30 1100) BP: (74-116)/(31-66) 100/62 mmHg (08/30 1100) SpO2:  [97 %-100 %] 100 % (08/30 1100) HEMODYNAMICS: CVP:  [5 mmHg-8 mmHg] 8 mmHg VENTILATOR SETTINGS:   INTAKE / OUTPUT: Intake/Output     08/29 0701 - 08/30 0700 08/30 0701 - 08/31 0700  I.V. (mL/kg) 1093.2 (17.9) 73.6 (1.2)   Blood 1076.7    Other     IV Piggyback 2025 605.2   Total Intake(mL/kg) 4194.8 (68.7) 678.8 (11.1)   Urine (mL/kg/hr)  1875 (1.3) 200 (0.7)   Emesis/NG output     Stool 1400 (1)    Total Output 3275 200   Net +919.8 +478.8        Urine Occurrence 1 x      PHYSICAL EXAMINATION: General:  Poorly oriented, NAD, + F/C. LOOKS DRY Neuro: RASS -1/-2 equivalent.  MAEs with equal strength, sensory intact, DTRs diminished symmetrically, no asterixis HEENT: Mild sclericterus, dysconjugate gaze, EOMI, PERRL, OP benign Cardiovascular:  RRR s M Lungs:  Diffuse coarse wheezes and scattered rhonchi Abdomen:  Soft, NT, hyperactive BS, liver edge approx 3 cm below costal margin, liver is not tender, Murphy's sign negative Ext: musculature normal, ext warm, no edema Skin: no lesions noted  LABS: PULMONARY  Recent Labs Lab 08/30/12 1710  PHART 7.600*  PCO2ART 29.4*  PO2ART 81.9  HCO3 29.0*  TCO2 26.3  O2SAT 96.9    CBC  Recent Labs Lab 08/30/12 2307 08/31/12 0335 08/31/12 1100 08/31/12 1443  HGB 7.0* 6.9* 7.7*  --   HCT 18.0* 17.6* 20.3*  --   WBC 1.7* 1.5* 1.6*  --   PLT 14* 12* 11* 56*    COAGULATION  Recent Labs Lab 08/30/12 1500 08/31/12 1443  INR 1.10 1.12    CARDIAC    Recent Labs Lab 08/30/12 1500  TROPONINI <0.30   No results found for this basename: PROBNP,  in the last 168 hours   CHEMISTRY  Recent Labs Lab 08/30/12 1500 08/30/12 1630  08/31/12 0335 08/31/12 1100  08/31/12 1745 08/31/12 2050 09/01/12 0215 09/01/12 0500 09/01/12 0840  NA 112*  --   < > 116* 121*  < > 123* 122* 126* 126* 124*  K 2.0*  --   < > 2.9* 3.0*  < > 3.4* 3.7 3.1* 2.9* 3.7  CL <65*  --   < > 81* 89*  < > 89* 88* 90* 89* 89*  CO2 23  --   < > 31 28  < > 27 26 27 28 28   GLUCOSE 127*  --   < > 132* 120*  < > 130* 174* 136* 143* 175*  BUN 33*  --   < > 21 12  < > 8 7 5* 5* 4*  CREATININE 1.68*  --   < > 0.68 0.52  < > 0.46* 0.43* 0.45* 0.40* 0.38*  CALCIUM 8.5  --   < > 7.2* 7.1*  < > 7.1* 7.2* 7.2* 7.2* 7.1*  MG  --  1.3*  --   --  2.1  --   --   --   --  1.6  --   PHOS  --   --   --    --  0.3*  --   --   --   --  1.4*  --   < > = values in this interval not displayed. Estimated Creatinine Clearance: 101.8 ml/min (by C-G formula based on Cr of 0.38).   LIVER  Recent Labs Lab 08/30/12 1500 08/31/12 0335 08/31/12 1443  AST 51* 54*  --   ALT 23 18  --   ALKPHOS 93 67  --   BILITOT 1.8* 1.4*  --   PROT 6.6 4.9*  --   ALBUMIN 3.3* 2.5*  --   INR 1.10  --  1.12     INFECTIOUS  Recent Labs Lab 08/30/12 1548 08/30/12 2307 09/01/12 0500  LATICACIDVEN 11.96* 1.1 0.7  PROCALCITON  --  5.34  --      ENDOCRINE CBG (last 3)  No results found for this basename: GLUCAP,  in the last 72 hours       IMAGING x48h  Dg Chest 1 View  08/30/2012   *RADIOLOGY REPORT*  Clinical Data: Dizziness  CHEST - 1 VIEW  Comparison: 12/12/2006  Findings: Cardiomediastinal silhouette is stable.  No acute infiltrate or pleural effusion.  No pulmonary edema.  IMPRESSION: No active disease.  No significant change.   Original Report Authenticated By: Natasha Mead, M.D.   Ct Head Wo Contrast  08/30/2012   *RADIOLOGY REPORT*  Clinical Data:  History of confusion.  History of altered gait.  CT HEAD WITHOUT CONTRAST  Technique: Contiguous axial images were obtained from the base of the skull through the vertex without contrast  Comparison:  None  Findings:  There is no evidence of brain mass, brain hemorrhage, or acute infarction.  The ventricular system is normal size and shape.  There is no evidence of shift of midline structures, parenchymal lesion, or subdural or epidural hematoma.  The calvarium is intact.  Mastoids are well aerated.  There is mucosal thickening involving the medial aspect of the right maxillary sinus.  There is an oval opacity which may reflect a small mucous retention cyst or polyp.  No other significant mucosal thickening is seen in the paranasal sinuses.  No air fluid levels are seen.  No sinus expansion or bony destruction is evident.  IMPRESSION: There is no evidence  of brain mass, brain hemorrhage, or acute infarction.  No acute or active brain process is seen.  No skull lesion is evident.  No sinusitis is evident.  There is mucosal thickening involving the medial aspect of the right maxillary sinus.  There is an oval opacity which may reflect a small mucous retention cyst or polyp.   Original Report Authenticated By: Onalee Hua Call   Ct Abdomen Pelvis W Contrast  08/30/2012   *RADIOLOGY REPORT*  Clinical Data: Sepsis, vomiting, diarrhea  CT ABDOMEN AND PELVIS WITH CONTRAST  Technique:  Multidetector CT imaging of the abdomen and pelvis was performed following the standard protocol during bolus administration of intravenous contrast.  Contrast: 80 ml Omnipaque-300 IV  Comparison: None.  Findings: Lung bases are essentially clear.  Moderate hepatic steatosis with focal fatty sparing in the gallbladder fossa.  Gallbladder is distended but without associated inflammatory changes.  No intrahepatic or extrahepatic ductal dilatation.  Kidneys are within normal limits.  No hydronephrosis.  No evidence of bowel obstruction.  Normal appendix.  Wall thickening involving the majority of the colon and possibly the terminal ileum, suggesting infectious/inflammatory enterocolitis. Sigmoid colon and rectum are fluid-filled but otherwise relatively spared.  No drainable fluid collection/abscess.  No free air.  Atherosclerotic calcifications of the abdominal aorta and branch vessels.  No abdominopelvic ascites.  No suspicious abdominopelvic lymphadenopathy.  Prostate and bladder are obscured by streak artifact.  Bilateral total hip arthroplasties.  Degenerative changes of the visualized thoracolumbar spine.  IMPRESSION: Wall thickening involving the majority of the colon and possibly the terminal ileum, worrisome for infectious/inflammatory enterocolitis.  Rectum and sigmoid colon are relatively spared.  No drainable fluid collection/abscess.  No free air.   Original Report Authenticated By:  Charline Bills, M.D.   Dg Chest Port 1 View  08/31/2012   *RADIOLOGY REPORT*  Clinical  Data: Status post central line placement  PORTABLE CHEST - 1 VIEW  Comparison: 08/30/2012  Findings: A new right jugular central line is noted with catheter tip in the mid superior vena cava.  No pneumothorax is seen.  The lungs are well-aerated without focal infiltrate.  No bony abnormality is noted.  IMPRESSION: No evidence of pneumothorax following central line placement.   Original Report Authenticated By: Alcide Clever, M.D.        ASSESSMENT / PLAN:  PULMONARY A: Heavy smoker Bronchospasm Presumed COPD  09/01/12 - no issues  P:   Nebulized BDs Supplemental O2 Monitor   CARDIOVASCULAR A: H/O hypertension  09/01/12 - on levophed  P:  Levophed for mAP > 65 CVP goal > 8 (CAREFUL WITH VOLUME BOLUSES DUE TO RAPID CORRECTION OF LOW NA and RISk Oof DEMYLENAION) Monitor Holding amlodipine  RENAL A:  AKI - likely prerenal/hypovolemic - resolved Lactic acidosis - resolved  08/30/12 - At admission severe hyponatremia Na 115, chronic - likely low solute related (beer potomania) and low volume related, with mental status changes  09/01/12 - Na correctiin slow and appropriate. Low mag and phos corrected by elink   P:   DC Normal saline - change to [facility] Goal Na should be  -   < 124 meq at 3pm 08/31/12  < 126 meq at 9 pm 08/31/12   < 127 meq at 3 am 09/01/12  < 130 meq at 3pm 09/01/12  IF Correction, going faster than this ADD DESMOPRESSIN  (1 to 2 mcg intravenously or subcutaneously every eight hours for 24 to 48 hours) or give free  Water  Monitor BMET intermittently Q6h Correct electrolytes as indicated Monitor acid-base - anticipate resolution of acidosis with resuscitation  GASTROINTESTINAL A:  Elevated LFTs Enlarged liver Elevated lipase - exam unimpressive for pancreatitis CT findings of pancolitis Severe nausea, vomiting Diarrhea  09/01/12 - C diff negative. Diarrhea  +  P:  DC protonix Abx as above; if still diarrhea consider dc flagyl Supportive care; dc zofran. Use compazine for nausea  HEMATOLOGIC A: Anemia without evidence of acute blood loss Thrombocytopenia without evidence of bleeding Relative leukopenia Suspected alcohol induced bone marrow toxicity  08/31/12 - worsening anemia and platelets. Smear schisto negative. DIC panel negative. Suspect pancytopenia due to etoh v sepsis  P:  Transfuse per usual ICU guidelines; PRBC and Platelet today Monitor CBC intermittently   INFECTIOUS A:  Suspected colitis Lactic acidosis without evidence of septic shock  Suspect due to liver disease P:   Abx and micro as above Levophed  ENDOCRINE A: Mild hyperglycemia Risk for hypoglycemia given liver and renal dysfunction P:   Monitor glucoses on chemistry panels No SSI ordered  NEUROLOGIC A:  Acute encephalopathy - possible Wernicke's High risk for DTs  08/31/12 - Lethargic 8/3/104 - improved  P:   CIWA protocol High dose thiamine Use  precedex if needed AVOID RAPID CORRECTION OF SODIUM; see Nas cogla  GLOBAL 08/31/12 - sister updated. Spiritual support + 09/01/12- Dad updated at bedside   The patient is critically ill with multiple organ systems failure and requires high complexity decision making for assessment and support, frequent evaluation and titration of therapies, application of advanced monitoring technologies and extensive interpretation of multiple databases.   Critical Care Time devoted to patient care services described in this note is  54  Minutes   Dr. Kalman Shan, M.D., [facility].C.P Pulmonary and Critical Care Medicine Staff Physician [facility] [place] [facility] Pager:  [phone], If no answer or between  15:00h - 7:00h: call [phone]  09/01/2012 11:51 AM

## 2012-09-01 NOTE — Progress Notes (Signed)
eLink Physician-Brief Progress Note Patient Name: Eugene Jackson DOB: 07/29/1970 MRN: [id number]  Date of Service  09/01/2012   HPI/Events of Note  Hypokalemia and hypophos  eICU Interventions  Potassium and phos replaced   Intervention Category Intermediate Interventions: Electrolyte abnormality - evaluation and management  Sonali Wivell 09/01/2012, 5:49 AM

## 2012-09-02 LAB — BASIC METABOLIC PANEL
BUN: <3 mg/dL — ABNORMAL LOW (ref 6–23)
CO2: 27 mEq/L (ref 19–32)
CO2: 25 mEq/L (ref 19–32)
Calcium: 7.2 mg/dL — ABNORMAL LOW (ref 8.4–10.5)
Chloride: 93 mEq/L — ABNORMAL LOW (ref 96–112)
Chloride: 95 mEq/L — ABNORMAL LOW (ref 96–112)
Chloride: 100 mEq/L (ref 96–112)
Creatinine, Ser: 0.4 mg/dL — ABNORMAL LOW (ref 0.50–1.35)
Creatinine, Ser: 0.38 mg/dL — ABNORMAL LOW (ref 0.50–1.35)
GFR calc Af Amer: >90 mL/min (ref >90)
GFR calc Af Amer: >90 mL/min (ref >90)
GFR calc non Af Amer: >90 mL/min (ref >90)
Glucose, Bld: 205 mg/dL — ABNORMAL HIGH (ref 70–99)
Glucose, Bld: 219 mg/dL — ABNORMAL HIGH (ref 70–99)
Potassium: 3.3 mEq/L — ABNORMAL LOW (ref 3.5–5.1)
Potassium: 2.9 mEq/L — ABNORMAL LOW (ref 3.5–5.1)
Sodium: 128 mEq/L — ABNORMAL LOW (ref 135–145)

## 2012-09-02 LAB — PHOSPHORUS: Phosphorus: 1.1 mg/dL — ABNORMAL LOW (ref 2.3–4.6)

## 2012-09-02 LAB — MAGNESIUM: Magnesium: 1.3 mg/dL — ABNORMAL LOW (ref 1.5–2.5)

## 2012-09-02 MED ORDER — MAGNESIUM SULFATE 40 MG/ML IJ SOLN
4.0000 g | Freq: Once | INTRAMUSCULAR | Status: AC
  Administered 2012-09-02: 4 g via INTRAVENOUS
  Filled 2012-09-02: qty 100

## 2012-09-02 MED ORDER — POTASSIUM PHOSPHATE DIBASIC 3 MMOLE/ML IV SOLN
30.0000 mmol | Freq: Once | INTRAVENOUS | Status: AC
  Administered 2012-09-02: 30 mmol via INTRAVENOUS
  Filled 2012-09-02: qty 10

## 2012-09-02 MED ORDER — SODIUM CHLORIDE 0.9 % IV SOLN
INTRAVENOUS | Status: DC
  Administered 2012-09-02: 1000 mL via INTRAVENOUS
  Administered 2012-09-03 – 2012-09-06 (×5): via INTRAVENOUS

## 2012-09-02 MED ORDER — POTASSIUM CHLORIDE 10 MEQ/50ML IV SOLN
10.0000 meq | INTRAVENOUS | Status: AC
  Administered 2012-09-02 – 2012-09-03 (×4): 10 meq via INTRAVENOUS
  Filled 2012-09-02: qty 200

## 2012-09-02 MED ORDER — POTASSIUM CHLORIDE CRYS ER 20 MEQ PO TBCR
40.0000 meq | EXTENDED_RELEASE_TABLET | Freq: Once | ORAL | Status: AC
  Administered 2012-09-02: 40 meq via ORAL
  Filled 2012-09-02: qty 2

## 2012-09-02 NOTE — Progress Notes (Signed)
eLink Physician-Brief Progress Note Patient Name: Eugene Jackson DOB: 1970/03/31 MRN: [id number]  Date of Service  09/02/2012   HPI/Events of Note  Hypophosphatemia, hypomag and ongoing potassium losses   eICU Interventions  Plan: Phos, Mag and Potassium replaced   Intervention Category Intermediate Interventions: Electrolyte abnormality - evaluation and management  Joachim Carton 09/02/2012, 3:36 AM

## 2012-09-02 NOTE — Progress Notes (Signed)
eLink Physician-Brief Progress Note Patient Name: Eugene Jackson DOB: 05/15/1970 MRN: [id number]  Date of Service  09/02/2012   HPI/Events of Note     eICU Interventions  Hypokalemia, repleted       Quinton Voth 09/02/2012, 9:24 PM

## 2012-09-02 NOTE — Progress Notes (Signed)
ANTIBIOTIC CONSULT NOTE - Follow-up  Pharmacy Consult for ciprofloxacin Indication: colitis  No Known Allergies  Patient Measurements: Height: 5\' 7"  (170.2 cm) Weight: 139 lb 8.8 oz (63.3 kg) IBW/kg (Calculated) : 66.1  Vital Signs: Temp: 98.8 F (37.1 C) (08/31 0400) Temp src: Oral (08/31 0000) BP: 110/58 mmHg (08/31 0900) Pulse Rate: 82 (08/31 0900)  Labs:  Recent Labs  08/30/12 2307 08/31/12 0335 08/31/12 1100 08/31/12 1443  09/01/12 1500 09/01/12 2103 09/02/12 0300  WBC 1.7* 1.5* 1.6*  --   --   --   --   --   HGB 7.0* 6.9* 7.7*  --   --   --   --   --   PLT 14* 12* 11* 56*  --   --   --   --   CREATININE 0.87 0.68 0.52 0.48*  < > 0.39* 0.45* 0.40*  < > = values in this interval not displayed. Estimated Creatinine Clearance: 105.5 ml/min (by C-G formula based on Cr of 0.4).   Assessment: 42 yo presented 8/28 with c/o generalized weakness, SOB, N/V/D x 1 week, and with abnormal labs drawn by PCP office 8/27. Code Sepsis called and then discontinued by CCM. Abd CT on 8/28 showed infectious vs inflammatory colitis. Patient received 1 dose of vancomycin and 1 dose of Zosyn in the ED, then Pharmacy consulted to dose ciprofloxacin.   8/28 >> Vanc x1 8/28 >> Zosyn x1 8/28 >> Flagyl (MD dosing) >> 8/28 >> Cipro >>  Tmax24h: Afeb WBCs: 2.6-->1.5, ANC 1.5-->1.0 Renal: Scr low (0.40), CrCl >100  8/28 blood x 2 >> NGTD 8/28 urine >> NGF 8/28 C.diff: negative 8/28 MRSA PCR: negative  Goal of Therapy:  Eradication of Infection  Plan:  Continue ciprofloxacin as scheduled.  Dose of flagyl appropriate.  F/u ability to tolerate PO medication and changing abx to PO.  Haynes Hoehn, PharmD 09/02/2012, 9:40 AM  Pager: [phone]

## 2012-09-02 NOTE — Progress Notes (Addendum)
PULMONARY  / CRITICAL CARE MEDICINE  Name: Eugene Jackson MRN: [id number] DOB: 1970-11-05    ADMISSION DATE:  08/30/2012  REFERRING MD : EDP PRIMARY SERVICE: PCCM  CHIEF COMPLAINT: N/V/D  BRIEF PATIENT DESCRIPTION:  Pt is a poor historian who defers to his niece to provide history. He has had a week or more of GI symptoms as above. He denies abdominal pain, hematemesis, hematochezia and melena. He has developed progressive weakness since the onset of this illness and was seen by his primary care MD who obtained labs on the day PTA. There were several critical abnormalities that led the primary MD to refer the patient to the ED. His niece indicates that he is a heavy drinker of alcohol and typically consumes a large bottle of vodka every 1-2 days. He denies dyspnea, CP, cough, sputum production, dysuria, LE edema and calf tenderness. He has been complaining of diplopia over the past few days. He underwent a L hand/wrist surgery in early June for which he continues to wear a wrist splint. He also suffered a nondisplaced R femur fracture in mid June after an alcohol-related fall and has been confined to a wheelchair since then   LINES / TUBES:   CULTURES: Urine 8/28 >> UA NEG, not cultures Blood 8/28 >>   Results for orders placed during the hospital encounter of 08/30/12  CULTURE, BLOOD (ROUTINE X 2)     Status: None   Collection Time    08/30/12  4:39 PM      Result Value Range Status   Specimen Description BLOOD LEFT ARM   Final   Special Requests BOTTLES DRAWN AEROBIC AND ANAEROBIC [facility]   Final   Culture  Setup Time     Final   Value: 08/30/2012 22:18     Performed at [facility]   Culture     Final   Value:        BLOOD CULTURE RECEIVED NO GROWTH TO DATE CULTURE WILL BE HELD FOR 5 DAYS BEFORE ISSUING A FINAL NEGATIVE REPORT     Performed at [facility]   Report Status PENDING   Incomplete  CULTURE, BLOOD (ROUTINE X 2)     Status: None   Collection Time     08/30/12  4:44 PM      Result Value Range Status   Specimen Description BLOOD RIGHT ARM   Final   Special Requests BOTTLES DRAWN AEROBIC AND ANAEROBIC 6CC   Final   Culture  Setup Time     Final   Value: 08/30/2012 22:18     Performed at [facility]   Culture     Final   Value:        BLOOD CULTURE RECEIVED NO GROWTH TO DATE CULTURE WILL BE HELD FOR 5 DAYS BEFORE ISSUING A FINAL NEGATIVE REPORT     Performed at [facility]   Report Status PENDING   Incomplete  MRSA PCR SCREENING     Status: None   Collection Time    08/30/12  6:35 PM      Result Value Range Status   MRSA by PCR NEGATIVE  NEGATIVE Final   Comment:            The GeneXpert MRSA Assay (FDA     approved for NASAL specimens     only), is one component of a     comprehensive MRSA colonization     surveillance program. It is not  intended to diagnose MRSA     infection nor to guide or     monitor treatment for     MRSA infections.  URINE CULTURE     Status: None   Collection Time    08/31/12  3:01 AM      Result Value Range Status   Specimen Description URINE, RANDOM   Final   Special Requests NONE   Final   Culture  Setup Time     Final   Value: 08/31/2012 08:36     Performed at [facility]   Colony Count     Final   Value: NO GROWTH     Performed at [facility]   Culture     Final   Value: NO GROWTH     Performed at [facility]   Report Status 09/01/2012 FINAL   Final  CLOSTRIDIUM DIFFICILE BY PCR     Status: None   Collection Time    08/31/12  3:01 AM      Result Value Range Status   C difficile by pcr NEGATIVE  NEGATIVE Final   Comment: Performed at [facility]     ANTIBIOTICS: Cipro 8/28 >>  Anti-infectives   Start     Dose/Rate Route Frequency Ordered Stop   08/31/12 0600  vancomycin (VANCOCIN) 500 mg in sodium chloride 0.9 % 100 mL IVPB  Status:  Discontinued     500 mg 100 mL/hr over 60 Minutes Intravenous Every 12 hours 08/30/12 1645  08/30/12 1709   08/31/12 0200  piperacillin-tazobactam (ZOSYN) IVPB 3.375 g  Status:  Discontinued     3.375 g 12.5 mL/hr over 240 Minutes Intravenous Every 8 hours 08/30/12 1645 08/30/12 1709   08/30/12 2000  metroNIDAZOLE (FLAGYL) IVPB 500 mg     500 mg 100 mL/hr over 60 Minutes Intravenous Every 8 hours 08/30/12 1929     08/30/12 2000  ciprofloxacin (CIPRO) IVPB 400 mg     400 mg 200 mL/hr over 60 Minutes Intravenous Every 12 hours 08/30/12 1942     08/30/12 1700  piperacillin-tazobactam (ZOSYN) IVPB 3.375 g  Status:  Discontinued     3.375 g 100 mL/hr over 30 Minutes Intravenous  Once 08/30/12 1619 08/30/12 1709   08/30/12 1700  vancomycin (VANCOCIN) IVPB 1000 mg/200 mL premix  Status:  Discontinued     1,000 mg 200 mL/hr over 60 Minutes Intravenous  Once 08/30/12 1619 08/30/12 1709       SIGNIFICANT EVENTS / STUDIES:  8/28 CT head: NAD 8/28 CT abd/pelvis: pancolitis. NO ascites   08/31/12 - Na correction in 12h as of 4am today (112-> 116) and then anohter in 7h as of 11am (121); total in 20h. Getting D5Normal saline at 125 ml/h/ .  BP MAP 66 with sBP 80/90s. PCT suggesting sepsis. Continues to be lethargic but arousable. RN says lethargic since admission. Profound drop in platelets this morning hemoglobin low and other electrolytes   09/01/12 - improved mental status. On levophed    SUBJECTIVE/OVERNIGHT/INTERVAL HX 09/02/12: Continued improvement for mental status. Na slowly correctging  Only. Off levophed since 4am today. Profound lyte issues continue    VITAL SIGNS: Temp:  [98.7 F (37.1 C)-99.1 F (37.3 C)] 98.8 F (37.1 C) (08/31 0400) Pulse Rate:  [69-91] 82 (08/31 0900) Resp:  [11-17] 13 (08/31 1140) BP: (82-121)/(44-73) 90/55 mmHg (08/31 1140) SpO2:  [92 %-100 %] 95 % (08/31 0900) Weight:  [63.3 kg (139 lb 8.8 oz)]  63.3 kg (139 lb 8.8 oz) (08/31 0400) HEMODYNAMICS: CVP:  [5 mmHg-6 mmHg] 5 mmHg VENTILATOR SETTINGS:   INTAKE /  OUTPUT: Intake/Output     08/30 0701 - 08/31 0700 08/31 0701 - 09/01 0700   P.O. 120 200   I.V. (mL/kg) 2070.8 (32.7) 500 (7.9)   Blood     IV Piggyback 1545.2 435.2   Total Intake(mL/kg) 3736 (59) 1135.2 (17.9)   Urine (mL/kg/hr) 2725 (1.8) 450 (1.4)   Emesis/NG output 15 (0)    Stool 200 (0.1)    Total Output 2940 450   Net +796 +685.2          PHYSICAL EXAMINATION: General:  Poorly oriented, NAD, + F/C.  Neuro: RASS -1/-2 equivalent.  MAEs with equal strength, sensory intact, DTRs diminished symmetrically, no asterixis HEENT: Mild sclericterus, dysconjugate gaze, EOMI, PERRL, OP benign Cardiovascular:  RRR s M Lungs:  Diffuse coarse wheezes and scattered rhonchi Abdomen:  Soft, NT, hyperactive BS, liver edge approx 3 cm below costal margin, liver is not tender, Murphy's sign negative Ext: musculature normal, ext warm, no edema Skin: no lesions noted  LABS: PULMONARY  Recent Labs Lab 08/30/12 1710  PHART 7.600*  PCO2ART 29.4*  PO2ART 81.9  HCO3 29.0*  TCO2 26.3  O2SAT 96.9    CBC  Recent Labs Lab 08/30/12 2307 08/31/12 0335 08/31/12 1100 08/31/12 1443  HGB 7.0* 6.9* 7.7*  --   HCT 18.0* 17.6* 20.3*  --   WBC 1.7* 1.5* 1.6*  --   PLT 14* 12* 11* 56*    COAGULATION  Recent Labs Lab 08/30/12 1500 08/31/12 1443  INR 1.10 1.12    CARDIAC    Recent Labs Lab 08/30/12 1500  TROPONINI <0.30   No results found for this basename: PROBNP,  in the last 168 hours   CHEMISTRY  Recent Labs Lab 08/30/12 1500 08/30/12 1630  08/31/12 0335 08/31/12 1100  09/01/12 0500 09/01/12 0840 09/01/12 1500 09/01/12 2103 09/02/12 0300 09/02/12 0859  NA 112*  --   < > 116* 121*  < > 126* 124* 128* 125* 127* 128*  K 2.0*  --   < > 2.9* 3.0*  < > 2.9* 3.7 3.1* 3.0* 3.1* 3.3*  CL <65*  --   < > 81* 89*  < > 89* 89* 93* 91* 93* 95*  CO2 23  --   < > 31 28  < > 28 28 27 27 27 26   GLUCOSE 127*  --   < > 132* 120*  < > 143* 175* 157* 260* 205* 219*  BUN 33*  --    < > 21 12  < > 5* 4* 3* <3* <3* <3*  CREATININE 1.68*  --   < > 0.68 0.52  < > 0.40* 0.38* 0.39* 0.45* 0.40* 0.38*  CALCIUM 8.5  --   < > 7.2* 7.1*  < > 7.2* 7.1* 6.9* 7.1* 7.2* 7.1*  MG  --  1.3*  --   --  2.1  --  1.6  --   --   --  1.3*  --   PHOS  --   --   --   --  0.3*  --  1.4*  --   --   --  1.1*  --   < > = values in this interval not displayed. Estimated Creatinine Clearance: 105.5 ml/min (by C-G formula based on Cr of 0.38).   LIVER  Recent Labs Lab 08/30/12 1500 08/31/12 0335 08/31/12 1443  AST 51* 54*  --   ALT 23 18  --   ALKPHOS 93 67  --   BILITOT 1.8* 1.4*  --   PROT 6.6 4.9*  --   ALBUMIN 3.3* 2.5*  --   INR 1.10  --  1.12     INFECTIOUS  Recent Labs Lab 08/30/12 1548 08/30/12 2307 09/01/12 0500 09/01/12 0840 09/02/12 0300  LATICACIDVEN 11.96* 1.1 0.7  --   --   PROCALCITON  --  5.34  --  0.93 0.38     ENDOCRINE CBG (last 3)  No results found for this basename: GLUCAP,  in the last 72 hours       IMAGING x48h  Dg Chest Port 1 View  08/31/2012   *RADIOLOGY REPORT*  Clinical Data: Status post central line placement  PORTABLE CHEST - 1 VIEW  Comparison: 08/30/2012  Findings: A new right jugular central line is noted with catheter tip in the mid superior vena cava.  No pneumothorax is seen.  The lungs are well-aerated without focal infiltrate.  No bony abnormality is noted.  IMPRESSION: No evidence of pneumothorax following central line placement.   Original Report Authenticated By: Alcide Clever, M.D.        ASSESSMENT / PLAN:  PULMONARY A: Heavy smoker Bronchospasm Presumed COPD  09/01/12 - no issues  P:   Nebulized BDs Supplemental O2 Monitor   CARDIOVASCULAR A: H/O hypertension  09/01/12 - on levophed 09/02/12 - off levophed  P:  Levophed for mAP > 65 CVP goal > 8 (CAREFUL WITH VOLUME BOLUSES DUE TO RAPID CORRECTION OF LOW NA and RISk Oof DEMYLENAION) Monitor Holding amlodipine  RENAL A:  AKI - likely  prerenal/hypovolemic - resolved Lactic acidosis - resolved  08/30/12 - At admission severe hyponatremia Na 115, chronic - likely low solute related (beer potomania) and low volume related, with mental status changes  09/02/12 - Na correctiin slow and appropriate. Low mag and phos corrected by elink. Current fluid deficit to raise Na by next 24h is around 1.8L   P:   Goal Na should be  -   < 130 to 132  meq at 3pm 09/02/12  < 135 at 4am 09/03/12 So, normal slaine at 106ml/h x 24h  IF Correction, going faster than this ADD DESMOPRESSIN  (1 to 2 mcg intravenously or subcutaneously every eight hours for 24 to 48 hours) or give free  Water  Monitor BMET intermittently Q12h Correct electrolytes as indicated Monitor acid-base - anticipate resolution of acidosis with resuscitation  GASTROINTESTINAL A:  Elevated LFTs Enlarged liver Elevated lipase - exam unimpressive for pancreatitis CT findings of pancolitis Severe nausea, vomiting Diarrhea  09/01/12 - C diff negative. Diarrhea + 09/02/12 - diarrhea improvd affer dc protonix  P:  Abx as above; if still diarrhea consider dc flagyl  Use compazine for nausea Monitor If worse, then send stool studies  HEMATOLOGIC A: Anemia without evidence of acute blood loss Thrombocytopenia without evidence of bleeding Relative leukopenia Suspected alcohol induced bone marrow toxicity  08/31/12 - worsening anemia and platelets. Smear schisto negative. DIC panel negative. Suspect pancytopenia due to etoh v sepsis  P:  Transfuse per usual ICU guidelines; PRBC and Platelet today Monitor CBC intermittently   INFECTIOUS A:  Suspected colitis Lactic acidosis without evidence of septic shock  Suspect due to liver disease P:   Abx and micro as above DC depending on course  ENDOCRINE A: Mild hyperglycemia Risk for hypoglycemia given liver and renal dysfunction P:  Monitor glucoses on chemistry panels No SSI ordered  NEUROLOGIC A:  Acute  encephalopathy - possible Wernicke's High risk for DTs  08/31/12 - Lethargic 8/31/104 - improved significantly  P:   CIWA protocol High dose thiamine Use  precedex if needed AVOID RAPID CORRECTION OF SODIUM; see Nas goals GLOBAL 08/31/12 - sister updated. Spiritual support + 09/01/12 and - Dad updated at bedside   The patient is critically ill with multiple organ systems failure and requires high complexity decision making for assessment and support, frequent evaluation and titration of therapies, application of advanced monitoring technologies and extensive interpretation of multiple databases.   Critical Care Time devoted to patient care services described in this note is  6  Minutes   Dr. Kalman Shan, M.D., [facility].C.P Pulmonary and Critical Care Medicine Staff Physician [facility] [place] Pulmonary and Critical Care Pager: [phone], If no answer or between  15:00h - 7:00h: call [phone]  09/02/2012 11:56 AM

## 2012-09-03 LAB — TYPE AND SCREEN
Antibody Screen: NEGATIVE
Unit division: 0

## 2012-09-03 LAB — PROCALCITONIN: Procalcitonin: 0.13 ng/mL

## 2012-09-03 LAB — CBC
MCH: 35.8 pg — ABNORMAL HIGH (ref 26.0–34.0)
MCHC: 36.4 g/dL — ABNORMAL HIGH (ref 30.0–36.0)
RDW: 19.5 % — ABNORMAL HIGH (ref 11.5–15.5)

## 2012-09-03 LAB — BASIC METABOLIC PANEL
BUN: 5 mg/dL — ABNORMAL LOW (ref 6–23)
Calcium: 7.4 mg/dL — ABNORMAL LOW (ref 8.4–10.5)
Creatinine, Ser: 0.44 mg/dL — ABNORMAL LOW (ref 0.50–1.35)
GFR calc Af Amer: >90 mL/min (ref >90)
GFR calc Af Amer: >90 mL/min (ref >90)
GFR calc non Af Amer: >90 mL/min (ref >90)
GFR calc non Af Amer: >90 mL/min (ref >90)
Potassium: 2.9 mEq/L — ABNORMAL LOW (ref 3.5–5.1)
Sodium: 132 mEq/L — ABNORMAL LOW (ref 135–145)

## 2012-09-03 LAB — PHOSPHORUS: Phosphorus: 1.7 mg/dL — ABNORMAL LOW (ref 2.3–4.6)

## 2012-09-03 MED ORDER — POTASSIUM CHLORIDE 10 MEQ/50ML IV SOLN
10.0000 meq | INTRAVENOUS | Status: AC
  Administered 2012-09-03 (×6): 10 meq via INTRAVENOUS
  Filled 2012-09-03 (×6): qty 50

## 2012-09-03 MED ORDER — POTASSIUM PHOSPHATE DIBASIC 3 MMOLE/ML IV SOLN
30.0000 mmol | Freq: Once | INTRAVENOUS | Status: AC
  Administered 2012-09-03: 30 mmol via INTRAVENOUS
  Filled 2012-09-03: qty 10

## 2012-09-03 MED ORDER — MAGNESIUM SULFATE 40 MG/ML IJ SOLN
4.0000 g | Freq: Once | INTRAMUSCULAR | Status: AC
  Administered 2012-09-03: 4 g via INTRAVENOUS
  Filled 2012-09-03: qty 100

## 2012-09-03 MED ORDER — POTASSIUM CHLORIDE CRYS ER 20 MEQ PO TBCR
40.0000 meq | EXTENDED_RELEASE_TABLET | Freq: Once | ORAL | Status: AC
  Administered 2012-09-03: 40 meq via ORAL
  Filled 2012-09-03: qty 2

## 2012-09-03 NOTE — Progress Notes (Signed)
eLink Physician-Brief Progress Note Patient Name: Eugene Jackson DOB: 09-08-1970 MRN: [id number]  Date of Service  09/03/2012   HPI/Events of Note   K 2.7  eICU Interventions  KCL PO ordered   Intervention Category Major Interventions: Electrolyte abnormality - evaluation and management  Shan Levans 09/03/2012, 9:49 PM

## 2012-09-03 NOTE — Progress Notes (Signed)
PULMONARY  / CRITICAL CARE MEDICINE  Name: Eugene Jackson MRN: [id number] DOB: 02-Jan-1971    ADMISSION DATE:  08/30/2012  REFERRING MD : EDP PRIMARY SERVICE: PCCM  CHIEF COMPLAINT: N/V/D  BRIEF PATIENT DESCRIPTION:  Pt is a poor historian who defers to his niece to provide history. He has had a week or more of GI symptoms as above. He denies abdominal pain, hematemesis, hematochezia and melena. He has developed progressive weakness since the onset of this illness and was seen by his primary care MD who obtained labs on the day PTA. There were several critical abnormalities that led the primary MD to refer the patient to the ED. His niece indicates that he is a heavy drinker of alcohol and typically consumes a large bottle of vodka every 1-2 days. He denies dyspnea, CP, cough, sputum production, dysuria, LE edema and calf tenderness. He has been complaining of diplopia over the past few days. He underwent a L hand/wrist surgery in early June for which he continues to wear a wrist splint. He also suffered a nondisplaced R femur fracture in mid June after an alcohol-related fall and has been confined to a wheelchair since then   LINES / TUBES:   CULTURES: MRSA PCR 8/28 - NEGATIVe Urine 8/28 >> UA NEG, not cultures C DIFF PCR 8/29 =- NEG Blood 8/28 >>  Neg as of 9//1/14 ............ Stool culture 09/03/12 (E colii, shigella, yersinia, salmonella) Stooll PCR multiplex 09/03/12 >> Norovirus PCR  09/03/12 >> Rotavirus PCR  09/03/12 >> HIV 09/03/12 >>>   ANTIBIOTICS: Vanc 8/28 >>8/28 Zosyn 8/28 >> 8/28 Cipro 8/28 >>  Flagyl IV 8/28 >>09/03/12 (diarrhea)    SIGNIFICANT EVENTS / STUDIES:  8/28 CT head: NAD 8/28 CT abd/pelvis: pancolitis. NO ascites   08/31/12 - Na correction in 12h as of 4am today (112-> 116) and then anohter in 7h as of 11am (121); total in 20h. Getting D5Normal saline at 125 ml/h/ .  BP MAP 66 with sBP 80/90s. PCT suggesting sepsis. Continues to be lethargic  but arousable. RN says lethargic since admission. Profound drop in platelets this morning hemoglobin low and other electrolytes   09/01/12 - improved mental status. On levophed. Diarrhea ++++ -> stop protonix   09/02/12: Continued improvement for mental status. Na slowly correctging  Only. Off levophed since 4am today. Profound lyte issues continue. Diarrhea improved after dc protonix    SUBJECTIVE/OVERNIGHT/INTERVAL HX   09/03/12 - diarrhea greenish, liquid still +. Volume improved compared to 48h ago when protonix stopped but still present. No clear etiology. Appetite poor. No evidence of etoh wd. Off pressors x > 24h.  Na corection continues along coservative lines. Mag, Phos, K continue to be very low; ? GI loss  VITAL SIGNS: Temp:  [98.6 F (37 C)-98.8 F (37.1 C)] 98.8 F (37.1 C) (09/01 0400) Pulse Rate:  [66-81] 66 (09/01 0600) Resp:  [13-21] 18 (09/01 0600) BP: (89-112)/(53-75) 109/67 mmHg (09/01 0600) SpO2:  [89 %-99 %] 94 % (09/01 0900) HEMODYNAMICS: CVP:  [5 mmHg-7 mmHg] 7 mmHg VENTILATOR SETTINGS:   INTAKE / OUTPUT: Intake/Output     08/31 0701 - 09/01 0700 09/01 0701 - 09/02 0700   P.O. 560    I.V. (mL/kg) 1733.3 (27.4)    IV Piggyback 1035.2    Total Intake(mL/kg) 3328.5 (52.6)    Urine (mL/kg/hr) 2150 (1.4)    Emesis/NG output     Stool 100 (0.1)    Total Output 2250  Net +1078.5            PHYSICAL EXAMINATION: General:  Chronically unwell looking male. Looks much better. Looks deconditioned Neuro: RASS 0, CAM-ICU neg for delirum. Flat affect. Moves all 4. Slow responses to questions HEENT: Mild sclericterus, dysconjugate gaze, EOMI, PERRL, OP benign Cardiovascular:  RRR s M Lungs:  Diffuse coarse wheezes and scattered rhonchi Abdomen:  Soft, NT, No ascites Ext: musculature normal, ext warm, no edema Skin: no lesions noted  LABS: PULMONARY  Recent Labs Lab 08/30/12 1710  PHART 7.600*  PCO2ART 29.4*  PO2ART 81.9  HCO3 29.0*  TCO2 26.3   O2SAT 96.9    CBC  Recent Labs Lab 08/30/12 2307 08/31/12 0335 08/31/12 1100 08/31/12 1443  HGB 7.0* 6.9* 7.7*  --   HCT 18.0* 17.6* 20.3*  --   WBC 1.7* 1.5* 1.6*  --   PLT 14* 12* 11* 56*    COAGULATION  Recent Labs Lab 08/30/12 1500 08/31/12 1443  INR 1.10 1.12    CARDIAC    Recent Labs Lab 08/30/12 1500  TROPONINI <0.30   No results found for this basename: PROBNP,  in the last 168 hours   CHEMISTRY  Recent Labs Lab 08/30/12 1630  08/31/12 0335 08/31/12 1100  09/01/12 0500  09/01/12 2103 09/02/12 0300 09/02/12 0859 09/02/12 2030 09/03/12 0400 09/03/12 0850  NA  --   < > 116* 121*  < > 126*  < > 125* 127* 128* 132*  --  132*  K  --   < > 2.9* 3.0*  < > 2.9*  < > 3.0* 3.1* 3.3* 2.9*  --  2.9*  CL  --   < > 81* 89*  < > 89*  < > 91* 93* 95* 100  --  100  CO2  --   < > 31 28  < > 28  < > 27 27 26 25   --  26  GLUCOSE  --   < > 132* 120*  < > 143*  < > 260* 205* 219* 154*  --  129*  BUN  --   < > 21 12  < > 5*  < > <3* <3* <3* 3*  --  4*  CREATININE  --   < > 0.68 0.52  < > 0.40*  < > 0.45* 0.40* 0.38* 0.38*  --  0.41*  CALCIUM  --   < > 7.2* 7.1*  < > 7.2*  < > 7.1* 7.2* 7.1* 6.9*  --  7.4*  MG 1.3*  --   --  2.1  --  1.6  --   --  1.3*  --   --  1.6  --   PHOS  --   --   --  0.3*  --  1.4*  --   --  1.1*  --   --  1.7*  --   < > = values in this interval not displayed. Estimated Creatinine Clearance: 105.5 ml/min (by C-G formula based on Cr of 0.38).   LIVER  Recent Labs Lab 08/30/12 1500 08/31/12 0335 08/31/12 1443  AST 51* 54*  --   ALT 23 18  --   ALKPHOS 93 67  --   BILITOT 1.8* 1.4*  --   PROT 6.6 4.9*  --   ALBUMIN 3.3* 2.5*  --   INR 1.10  --  1.12     INFECTIOUS  Recent Labs Lab 08/30/12 1548  08/30/12 2307 09/01/12 0500 09/01/12 0840 09/02/12 0300  09/03/12 0400  LATICACIDVEN 11.96*  --  1.1 0.7  --   --   --   PROCALCITON  --   < > 5.34  --  0.93 0.38 0.13  < > = values in this interval not  displayed.   ENDOCRINE CBG (last 3)  No results found for this basename: GLUCAP,  in the last 72 hours       IMAGING x48h  No results found.      ASSESSMENT / PLAN:  PULMONARY A: Heavy smoker Bronchospasm Presumed COPD  09/03/12 - no issues  P:   Nebulized BDs Supplemental O2 Monitor   CARDIOVASCULAR A: H/O hypertension  09/03/12 - off levophed x 24h.  CVP 7. MAP 97. HR 80   P:  DC hydrocrt DC levphed from order set Goal  mAP > 65 CVP goal > 8 (CAREFUL WITH VOLUME BOLUSES DUE TO RAPID CORRECTION OF LOW NA and RISk Oof DEMYLENAION) Monitor Holding amlodipine  RENAL A:  AKI - likely prerenal/hypovolemic - resolved Lactic acidosis - resolved  08/30/12 - At admission severe hyponatremia Na 115, chronic - likely low solute related (beer potomania) and low volume related, with mental status changes  09/03/12:  - Na correctiin slow and appropriate; goal correction is only q12h or q24h. K/Mag/Phos continue to be very low; ? Due to gI loss    P:   Goal Na should be  -   < 136 at 9pm 09/03/12  < 140 at 9am 09/04/12  So, reduce normal slaine to 72mll/h x 24h; can stiop once Na is > 135 consistenly  IF Correction, going faster than this ADD DESMOPRESSIN  (1 to 2 mcg intravenously or subcutaneously every eight hours for 24 to 48 hours) or give free  Water  Monitor BMET intermittently Q12h Correct electrolytes as indicated Monitor acid-base - anticipate resolution of acidosis with resuscitation  GASTROINTESTINAL A:  Elevated LFTs Enlarged liver Elevated lipase - exam unimpressive for pancreatitis CT findings of pancolitis Severe nausea, vomiting Diarrhea  09/01/12 - C diff negative. Diarrhea +,. Will dc protonix 09/02/12 - diarrhea improvd  09/03/12 - still with greenish, loose stool thoug volume less compared to 09/01/12  P:  DC flagyl Stool PCR multiplex for different viruses/bacteria Stool culture for CSSY If still persists or PCR/Culture  negative, call GI Use compazine for nausea Monitor   HEMATOLOGIC A: Anemia without evidence of acute blood loss Thrombocytopenia without evidence of bleeding Relative leukopenia Suspected alcohol induced bone marrow toxicity  08/31/12 - worsening anemia and platelets. Smear schisto negative. DIC panel negative. Suspect pancytopenia due to etoh v sepsis   P:  Transfuse per usual ICU guidelines; PRBC and Platelet today Monitor CBC ; rechek 09/03/12   INFECTIOUS A:  Suspected colitis Lactic acidosis without evidence of septic shock  Suspect due to liver disease  09/03/12 - so far culture negative except stool being sent today  P:   DC flagyl due to diarrhea Await other stool studies DC cipro depending on course  ENDOCRINE A: Mild hyperglycemia Risk for hypoglycemia given liver and renal dysfunction P:   Monitor glucoses on chemistry panels No SSI ordered  NEUROLOGIC A:  Acute encephalopathy - possible Wernicke's High risk for DTs  08/31/12 - Lethargic 09/02/12 and 09/03/12  - improved significantly. No evidence of DT  P:   CIWA protocol High dose thiamine Use  precedex if needed AVOID RAPID CORRECTION OF SODIUM; see Nas goals   GLOBAL 08/31/12 - sister updated. Spiritual support +  09/01/12 and  8/03/03/12- Dad updated at bedside 09/03/12 - no family at bedside. Keep in SDU   The patient is critically ill with multiple organ systems failure and requires high complexity decision making for assessment and support, frequent evaluation and titration of therapies, application of advanced monitoring technologies and extensive interpretation of multiple databases.   Critical Care Time devoted to patient care services described in this note is  22  Minutes   Dr. Kalman Shan, M.D., [facility]C.P Pulmonary and Critical Care Medicine Staff Physician [facility] [place] Pulmonary and Critical Care Pager: [phone], If no answer or between  15:00h - 7:00h: call [phone]  09/03/2012 9:18 AM

## 2012-09-03 NOTE — Progress Notes (Signed)
[id number]/Rhonda Davis, RN, BSN, CCM [phone] Chart Reviewed for discharge and hospital needs. Discharge needs at time of review:  None Review of patient progress due on [id number]. 

## 2012-09-04 ENCOUNTER — Inpatient Hospital Stay (HOSPITAL_COMMUNITY): Payer: No Typology Code available for payment source

## 2012-09-04 LAB — BASIC METABOLIC PANEL
BUN: 5 mg/dL — ABNORMAL LOW (ref 6–23)
BUN: 4 mg/dL — ABNORMAL LOW (ref 6–23)
CO2: 22 mEq/L (ref 19–32)
CO2: 18 mEq/L — ABNORMAL LOW (ref 19–32)
CO2: 24 mEq/L (ref 19–32)
Calcium: 7.1 mg/dL — ABNORMAL LOW (ref 8.4–10.5)
Calcium: 7.5 mg/dL — ABNORMAL LOW (ref 8.4–10.5)
Chloride: 102 mEq/L (ref 96–112)
Creatinine, Ser: 0.42 mg/dL — ABNORMAL LOW (ref 0.50–1.35)
Creatinine, Ser: 0.52 mg/dL (ref 0.50–1.35)
Creatinine, Ser: 0.44 mg/dL — ABNORMAL LOW (ref 0.50–1.35)
GFR calc non Af Amer: >90 mL/min (ref >90)
Glucose, Bld: 101 mg/dL — ABNORMAL HIGH (ref 70–99)
Glucose, Bld: 108 mg/dL — ABNORMAL HIGH (ref 70–99)
Sodium: 133 mEq/L — ABNORMAL LOW (ref 135–145)

## 2012-09-04 LAB — CBC WITH DIFFERENTIAL/PLATELET
Basophils Relative: 1 % (ref 0–1)
Eosinophils Absolute: 0.1 10*3/uL (ref 0.0–0.7)
Hemoglobin: 7.2 g/dL — ABNORMAL LOW (ref 13.0–17.0)
Lymphs Abs: 2.6 10*3/uL (ref 0.7–4.0)
MCH: 35.5 pg — ABNORMAL HIGH (ref 26.0–34.0)
MCHC: 35.1 g/dL (ref 30.0–36.0)
MCV: 101 fL — ABNORMAL HIGH (ref 78.0–100.0)
Monocytes Absolute: 1.4 10*3/uL — ABNORMAL HIGH (ref 0.1–1.0)
Neutrophils Relative %: 66 % (ref 43–77)

## 2012-09-04 LAB — GI PATHOGEN PANEL BY PCR, STOOL
G lamblia by PCR: NEGATIVE
Norovirus GI/GII: NEGATIVE
Rotavirus A by PCR: NEGATIVE
Salmonella by PCR: NEGATIVE
Shigella by PCR: NEGATIVE

## 2012-09-04 LAB — MAGNESIUM: Magnesium: 1.8 mg/dL (ref 1.5–2.5)

## 2012-09-04 LAB — PHOSPHORUS: Phosphorus: 2.5 mg/dL (ref 2.3–4.6)

## 2012-09-04 MED ORDER — LORAZEPAM 2 MG/ML IJ SOLN
2.0000 mg | Freq: Once | INTRAMUSCULAR | Status: AC
  Administered 2012-09-04: 2 mg via INTRAVENOUS

## 2012-09-04 MED ORDER — LORAZEPAM 2 MG/ML IJ SOLN: 2.0000 mg | INTRAMUSCULAR | Status: DC | PRN

## 2012-09-04 MED ORDER — POTASSIUM CHLORIDE CRYS ER 20 MEQ PO TBCR
40.0000 meq | EXTENDED_RELEASE_TABLET | ORAL | Status: AC
  Administered 2012-09-05 (×2): 40 meq via ORAL
  Filled 2012-09-04 (×2): qty 2

## 2012-09-04 NOTE — Progress Notes (Signed)
eLink Physician-Brief Progress Note Patient Name: Eugene Jackson DOB: 03-Oct-1970 MRN: [id number]  Date of Service  09/04/2012   HPI/Events of Note  Hypokalemia   eICU Interventions  Potassium replaced   Intervention Category Minor Interventions: Electrolytes abnormality - evaluation and management  Channelle Bottger 09/04/2012, 11:36 PM

## 2012-09-04 NOTE — Progress Notes (Addendum)
Discussed case with Neurology.  Appreciate evaluation / input.  They will formally evaluate.  Send for CT head to evaluate in the setting of new onset seizures.    Canary Brim, NP-C  Pulmonary & Critical Care Pgr: [phone] or [phone]

## 2012-09-04 NOTE — Progress Notes (Signed)
PULMONARY  / CRITICAL CARE MEDICINE  Name: Eugene Jackson MRN: [id number] DOB: 1970/05/03    ADMISSION DATE:  08/30/2012  REFERRING MD : EDP PRIMARY SERVICE: PCCM  CHIEF COMPLAINT: N/V/D  BRIEF PATIENT DESCRIPTION:  42 y/o M with PMH of ETOH abuse admitted 8/28 with progressive weakness.  Referred to ER from PCP with multiple critical lab abnormalities.  Found to have significant hyponatremia, AMS & hypotension.   Treated in ICU for hypotension, slow correction of NA & electrolyte disturbances, AMS, anemia & thrombocytopenia.  Cultures negative to date.  Mental status resolving.    LINES / TUBES:   CULTURES: Urine 8/28 >> UA NEG UC 8/29>>>neg Blood 8/28 >>  C-Diff 8/29>>>neg Stool Culture 9/1>>   ANTIBIOTICS: Flagyl 8/28>>>9/1 Vanco 8/28>>>8/29 Zosyn 8/28>>>8/29 Cipro 8/28 (colitis)>>>9/2   SIGNIFICANT EVENTS / STUDIES:  8/28 - CT head: NAD 8/28 - CT abd/pelvis: pancolitis. NO ascites 8/29 - Na correction in 12h as of 4am (112-> 116) and then another in 7h as of 11am (121); total in 20h. Getting D5Normal saline at 125 ml/h/ .  BP MAP 66 / SBP 80/90s. Continues to be lethargic but arousable. RN says lethargic since admission. Profound drop in platelets, hemoglobin low & other electrolytes 8/30 - improved mental status. On levophed 8/31 - Improvement for mental status. Na slowly correcting. Off levophed since 4am today. Continued lyte issues 9/02 - much improved mental status. Awake / alert.  NA corrected.  BP remains soft but wnl.   SUBJECTIVE:   RN reports no acute events.  Pt denies needs.  Notes some cravings for ETOH / shakes.    VITAL SIGNS: Temp:  [98.3 F (36.8 C)-99.1 F (37.3 C)] 98.3 F (36.8 C) (09/02 0800) Pulse Rate:  [64-81] 67 (09/02 0600) Resp:  [13-20] 13 (09/02 0600) BP: (89-119)/(53-71) 112/61 mmHg (09/02 0600) SpO2:  [90 %-100 %] 100 % (09/02 0830) Weight:  [148 lb 5.9 oz (67.3 kg)] 148 lb 5.9 oz (67.3 kg) (09/02  0400)  HEMODYNAMICS: CVP:  [3 mmHg-9 mmHg] 3 mmHg  VENTILATOR SETTINGS:    INTAKE / OUTPUT: Intake/Output     09/01 0701 - 09/02 0700 09/02 0701 - 09/03 0700   P.O. 480    I.V. (mL/kg) 975 (14.5)    IV Piggyback 1445.2    Total Intake(mL/kg) 2900.2 (43.1)    Urine (mL/kg/hr) 3350 (2.1)    Stool  1 (0)   Total Output 3350 1   Net -449.8 -1        Stool Occurrence 2 x    Emesis Occurrence 1 x      PHYSICAL EXAMINATION: General:  wdwn adult male, NAD, + F/C.  Neuro: Awake, alert, MAE, speech clear HEENT: Mild sclericterus, EOMI, PERRL, OP benign Cardiovascular:  RRR s M Lungs:  resp's even/non-labored on RA, lungs bilaterally coarse Abdomen:  Soft, NT, hyperactive BS, liver edge approx 3 cm below costal margin, liver is not tender, Murphy's sign negative Ext: musculature normal, ext warm, no edema Skin: no lesions noted  LABS: PULMONARY  Recent Labs Lab 08/30/12 1710  PHART 7.600*  PCO2ART 29.4*  PO2ART 81.9  HCO3 29.0*  TCO2 26.3  O2SAT 96.9   CBC  Recent Labs Lab 08/31/12 1100 08/31/12 1443 09/03/12 1100 09/04/12 0510  HGB 7.7*  --  7.2* 7.2*  HCT 20.3*  --  19.8* 20.5*  WBC 1.6*  --  10.0 12.4*  PLT 11* 56* 62* 94*   COAGULATION  Recent Labs Lab 08/30/12  1500 08/31/12 1443  INR 1.10 1.12   CARDIAC  Recent Labs Lab 08/30/12 1500  TROPONINI <0.30   CHEMISTRY  Recent Labs Lab 08/31/12 1100  09/01/12 0500  09/02/12 0300 09/02/12 0859 09/02/12 2030 09/03/12 0400 09/03/12 0850 09/03/12 2026 09/04/12 0510  NA 121*  < > 126*  < > 127* 128* 132*  --  132* 132*  --   K 3.0*  < > 2.9*  < > 3.1* 3.3* 2.9*  --  2.9* 3.5  --   CL 89*  < > 89*  < > 93* 95* 100  --  100 102  --   CO2 28  < > 28  < > 27 26 25   --  26 23  --   GLUCOSE 120*  < > 143*  < > 205* 219* 154*  --  129* 115*  --   BUN 12  < > 5*  < > <3* <3* 3*  --  4* 5*  --   CREATININE 0.52  < > 0.40*  < > 0.40* 0.38* 0.38*  --  0.41* 0.44*  --   CALCIUM 7.1*  < > 7.2*  < > 7.2*  7.1* 6.9*  --  7.4* 7.0*  --   MG 2.1  --  1.6  --  1.3*  --   --  1.6  --   --  1.8  PHOS 0.3*  --  1.4*  --  1.1*  --   --  1.7*  --   --  2.5  < > = values in this interval not displayed. Estimated Creatinine Clearance: 110.2 ml/min (by C-G formula based on Cr of 0.44).  LIVER  Recent Labs Lab 08/30/12 1500 08/31/12 0335 08/31/12 1443  AST 51* 54*  --   ALT 23 18  --   ALKPHOS 93 67  --   BILITOT 1.8* 1.4*  --   PROT 6.6 4.9*  --   ALBUMIN 3.3* 2.5*  --   INR 1.10  --  1.12   INFECTIOUS  Recent Labs Lab 08/30/12 1548  08/30/12 2307 09/01/12 0500 09/01/12 0840 09/02/12 0300 09/03/12 0400  LATICACIDVEN 11.96*  --  1.1 0.7  --   --   --   PROCALCITON  --   < > 5.34  --  0.93 0.38 0.13  < > = values in this interval not displayed.   IMAGING x48h  No results found.   ASSESSMENT / PLAN:  PULMONARY A:  Tobacco Abuse Bronchospasm Presumed COPD P:   Nebulized BDs Supplemental O2, wean for sats >90% Monitor   CARDIOVASCULAR A: Hypotension - off pressors 8/31  H/O hypertension P:  Hold amlodipine Off hydrocort, acceptable BP's  RENAL A:   AKI - likely prerenal/hypovolemic - resolved Lactic acidosis - resolved Hyponatremia  Hypokalemia P:   Monitor BMET intermittently Q12h, change to daily monitoring for 9/3 Correct electrolytes as indicated  GASTROINTESTINAL A:   Elevated LFTs Enlarged liver Elevated lipase - exam unimpressive for pancreatitis CT findings of pancolitis Severe nausea, vomiting - resolved.  Diarrhea - cdiff neg, improved after d/c protoninx.  Resolved.  P:  Abx as above compazine for nausea   HEMATOLOGIC A:  Anemia - without evidence of acute blood loss.  8/29 worsening, smear schisto neg, DIC neg. Thrombocytopenia - without evidence of bleeding Relative leukopenia Suspected alcohol induced bone marrow toxicity P:  Transfuse if Hgb <7%, active bleed or MI <8%  Monitor CBC    INFECTIOUS A:  Suspected colitis -  diarrhea improved with d/c of prontonix Lactic acidosis - without evidence of septic shock.  Suspect due to liver disease.  Resolving.  P:   Abx and micro as above DC cipro, monitor clinically 9/2  Consider d/c central line am 9/3 if labs remain wnl   ENDOCRINE A:  Mild hyperglycemia Risk for hypoglycemia given liver and renal dysfunction P:   Monitor glucoses on chemistry  Hold SSI  NEUROLOGIC A:   Acute encephalopathy - possible Wernicke's High risk for DTs P:   Monitor for DT's / Withdrawal sx, caution with benzos / sedation High dose thiamine   Will keep on SDU with soft to nml BP's.  Transfer to [facility] as of am 9/3 0700.  PCCM will sign off.  Please call back if we can be of further assistance.    Canary Brim, NP-C [facility] Pgr: [phone] or [phone]  Reviewed above, examined pt, and agree with assessment/plan.  Clinically improving.  Will transfer to medical floor bed.  Will ask [facility] to assume care from 9/03 and PCCM sign off.  Updated family at bedside.  Coralyn Helling, MD [facility] Pulmonary/Critical Care 09/04/2012, 11:45 AM Pager:  [phone] After 3pm call: [phone]

## 2012-09-04 NOTE — Progress Notes (Signed)
Called to bedside by RN.  Patient with 1-1.5 min grand mal seizure.  Upon arrival, patient is post-ictal with snoring respirations, no distress.  On side in recovery position with stable vital signs and saturations of 96% on RA.  No respiratory distress / need for advanced airway.  Plan: -patient given 2mg  IV ativan during seizure activity -leave in SDU -STAT BMP to review electrolytes -seizure / aspiration precautions -NPO until mental status clears   Father updated at bedside.    Canary Brim, NP-C [place] [facility] Pgr: [phone] or [phone]

## 2012-09-05 ENCOUNTER — Inpatient Hospital Stay (HOSPITAL_COMMUNITY)
Admit: 2012-09-05 | Discharge: 2012-09-05 | Disposition: A | Discharge status: Home / Self Care | Payer: No Typology Code available for payment source | Attending: Internal Medicine | Admitting: Internal Medicine

## 2012-09-05 DIAGNOSIS — D638 Anemia in other chronic diseases classified elsewhere: Secondary | ICD-10-CM | POA: Diagnosis present

## 2012-09-05 DIAGNOSIS — F10239 Alcohol dependence with withdrawal, unspecified: Secondary | ICD-10-CM | POA: Diagnosis present

## 2012-09-05 DIAGNOSIS — F10929 Alcohol use, unspecified with intoxication, unspecified: Secondary | ICD-10-CM | POA: Diagnosis present

## 2012-09-05 DIAGNOSIS — D709 Neutropenia, unspecified: Secondary | ICD-10-CM | POA: Diagnosis present

## 2012-09-05 DIAGNOSIS — R945 Abnormal results of liver function studies: Secondary | ICD-10-CM | POA: Diagnosis present

## 2012-09-05 DIAGNOSIS — R569 Unspecified convulsions: Secondary | ICD-10-CM

## 2012-09-05 DIAGNOSIS — E440 Moderate protein-calorie malnutrition: Secondary | ICD-10-CM | POA: Diagnosis present

## 2012-09-05 LAB — MAGNESIUM: Magnesium: 1.5 mg/dL (ref 1.5–2.5)

## 2012-09-05 LAB — PHOSPHORUS: Phosphorus: 4.2 mg/dL (ref 2.3–4.6)

## 2012-09-05 LAB — CULTURE, BLOOD (ROUTINE X 2): Culture: NO GROWTH

## 2012-09-05 LAB — ROTAVIRUS ANTIGEN, STOOL: Rotavirus: NEGATIVE

## 2012-09-05 LAB — BASIC METABOLIC PANEL
BUN: 4 mg/dL — ABNORMAL LOW (ref 6–23)
Chloride: 101 mEq/L (ref 96–112)
GFR calc Af Amer: >90 mL/min (ref >90)
Potassium: 3.1 mEq/L — ABNORMAL LOW (ref 3.5–5.1)

## 2012-09-05 LAB — CBC
HCT: 22.5 % — ABNORMAL LOW (ref 39.0–52.0)
Hemoglobin: 7.9 g/dL — ABNORMAL LOW (ref 13.0–17.0)
WBC: 8.6 10*3/uL (ref 4.0–10.5)

## 2012-09-05 MED ORDER — POTASSIUM CHLORIDE CRYS ER 20 MEQ PO TBCR
40.0000 meq | EXTENDED_RELEASE_TABLET | Freq: Two times a day (BID) | ORAL | Status: AC
  Administered 2012-09-05 (×2): 40 meq via ORAL
  Filled 2012-09-05 (×2): qty 2

## 2012-09-05 NOTE — Progress Notes (Signed)
[facility] PROGRESS NOTE  Eugene Jackson ZOX:[id number] DOB: 1970-10-24 DOA: 08/30/2012 PCP: Nonnie Done., MD  Brief narrative: 42 -year-old male with past medical history significant for alcohol abuse who presented to [facility] 08/30/2012 with complaints of progressive weakness, nausea vomiting. Patient was referred to ED for evaluation from his primary care physician office as he was found to have multiple critical lab abnormalities. Patient had sodium of 126, potassium of 2.9, phosphorus 1.4 and magnesium 1.3. White blood cell count was 2.6 and platelet count was 24. AST was 57 and ALT was within normal limits. Alcohol level and urine drug screen were not obtained at the time of the admission. Additionally, patient was found to be hypotensive and was admitted to intensive care unit and required pressor support until 09/02/2012. Hospital course has been complicated due to development of seizures. Patient had one seizure episode 09/04/2012, grand mal lasted about 1 minute with post ictal confusion. Per neurology recommendations we will obtain EEG. We will continue monitoring the patient in step down for another 24 hours prior to transferring to telemetry floor.  Assessment/Plan:  Principal Problem: Acute alcohol intoxication - Alcohol level and urine drug screen were not obtained at the time of the admission. Patient is a heavy drinker and was drinking up until arrival to emergency department on the day of the admission - CIWA protocol ordered - We will continue supportive care with IV fluids, normal saline at 50 cc an hour. Continue folic acid, multivitamin and thiamine - Continue antinausea medications as needed Septic shock - Patient was hypotensive on the admission and required pressor support. Patient is now hemodynamically stable and pressor support has been discontinued 09/02/2012 - Patient started on broad-spectrum antibiotics at the time of the admission, vancomycin and Zosyn. Lactic  acid was within normal limits and procalcitonin level was 5.34 but trended down  --> 0.93 --> 0.38 --> 0.13 - In addition, patient was on Cipro and Flagyl for findings of pan colitis on CT abdomen. Please note that all antibiotics are now discontinued Acute encephalopathy - Secondary to acute alcohol intoxication - CT head showed no acute intracranial findings - Ammonia level was within normal limits at the time of the admission - Patient is back to baseline mental status where he is alert and oriented to time, place and person Active Problems: Hyponatremia, Hypokalemia, Hypomagnesemia, Hypophosphatemia - Electrolyte abnormality secondary to alcohol abuse - Sodium stable at 132; potassium 3.1 this morning and being repleted daily. - Magnesium and phosphorus are within normal limits Elevated lipase - Likely related to alcohol abuse, possible pancreatitis - Lipase level was 279 on the admission and repeat level 184 - Patient has no abdominal pain and tolerates regular diet Nausea, vomiting, diarrhea - Based on CT abdomen findings consistent with pancolitis - Patient has completed course of Flagyl and ciprofloxacin - Stool culture reincubated for better growth - C. 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 Anemia of chronic disease - With no acute blood loss - Hemoglobin stable at 7.9 - Continue to monitor CBC Thrombocytopenia - Related to bone marrow toxicity due to alcohol abuse - Platelet count is now within normal limits Neutropenia - Secondary to bone marrow toxicity related to alcohol abuse - White blood cell count is now within normal limits DIC (disseminated intravascular coagulation) - Secondary to alcohol induced bone marrow toxicity - Continue to monitor Seizures - Likely alcohol related seizures - Appreciate neurology consultation - Plan for EEG today Abnormal liver function tests - Secondary  to alcohol abuse - AST elevated  at 51 and ALT within normal limits - Alkaline phosphatase within normal limits Moderate protein-calorie malnutrition - Secondary to history of alcohol abuse - Continue feeding supplements Alcohol abuse - Counseled on cessation  Code Status: Full code Family Communication: No family at the bedside Disposition Plan: Keep in step down unit for another 24 hours  Manson Passey, MD  [facility] Pager [phone]  If 7PM-7AM, please contact night-coverage [email or web address] Password [email or web address] 09/05/2012, 10:07 AM   LOS: 6 days   Consultants:  Neurology  Patient was under critical care medicine team until 09/04/2012. Patient is under [facility] care starting 09/05/2012  Procedures:  Plan for EEG today  Antibiotics:  Flagyl 8/28>>>9/1   Vanco 8/28>>>8/29   Zosyn 8/28>>>8/29   Cipro 8/28 (colitis)>>>9/2 Stool Culture 9/1>>; C-Diff 8/29>>>neg   HPI/Subjective: Had one grand mal seizure 09/04/2012. Resolved with Ativan 2 mg IV. Mental status at baseline this morning.  Objective: Filed Vitals:   09/05/12 0049 09/05/12 0348 09/05/12 0700 09/05/12 0800  BP: 116/69 105/64    Pulse: 64 64    Temp:  98.5 F (36.9 C)  98.5 F (36.9 C)  TempSrc:  Oral  Oral  Resp: 16 14    Height:      Weight:   64.1 kg (141 lb 5 oz)   SpO2: 97% 99%      Intake/Output Summary (Last 24 hours) at 09/05/12 1007 Last data filed at 09/05/12 0700  Gross per 24 hour  Intake 1600.2 ml  Output   4375 ml  Net -2774.8 ml    Exam:   General:  Pt is alert, follows commands appropriately, not in acute distress  Cardiovascular: Regular rate and rhythm, S1/S2, no murmurs, no rubs, no gallops  Respiratory: Clear to auscultation bilaterally, no wheezing, no crackles, no rhonchi  Abdomen: Soft, non tender, non distended, bowel sounds present, no guarding  Extremities: No edema, pulses DP and PT palpable bilaterally  Neuro: Grossly nonfocal  Data Reviewed: Basic Metabolic Panel:  Recent  Labs Lab 09/01/12 0500  09/02/12 0300  09/03/12 0400  09/03/12 2026 09/04/12 0510 09/04/12 0840 09/04/12 1600 09/04/12 2120 09/05/12 0440 09/05/12 0630  NA 126*  < > 127*  < >  --   < > 132*  --  134* 135 133*  --  132*  K 2.9*  < > 3.1*  < >  --   < > 3.5  --  3.3* 3.4* 3.1*  --  3.1*  CL 89*  < > 93*  < >  --   < > 102  --  105 102 102  --  101  CO2 28  < > 27  < >  --   < > 23  --  22 18* 24  --  24  GLUCOSE 143*  < > 205*  < >  --   < > 115*  --  101* 127* 108*  --  98  BUN 5*  < > <3*  < >  --   < > 5*  --  5* 5* 4*  --  4*  CREATININE 0.40*  < > 0.40*  < >  --   < > 0.44*  --  0.42* 0.52 0.44*  --  0.42*  CALCIUM 7.2*  < > 7.2*  < >  --   < > 7.0*  --  7.1* 7.7* 7.5*  --  7.8*  MG 1.6  --  1.3*  --  1.6  --   --  1.8  --   --   --  1.5  --   PHOS 1.4*  --  1.1*  --  1.7*  --   --  2.5  --   --   --  4.2  --   < > = values in this interval not displayed. Liver Function Tests:  Recent Labs Lab 08/30/12 1500 08/31/12 0335  AST 51* 54*  ALT 23 18  ALKPHOS 93 67  BILITOT 1.8* 1.4*  PROT 6.6 4.9*  ALBUMIN 3.3* 2.5*    Recent Labs Lab 08/30/12 1500 08/31/12 0335  LIPASE 279* 184*    Recent Labs Lab 08/30/12 1537  AMMONIA 47   CBC:  Recent Labs Lab 08/30/12 1500 08/30/12 2307 08/31/12 0335 08/31/12 1100 08/31/12 1443 09/03/12 1100 09/04/12 0510 09/05/12 0630  WBC 2.6* 1.7* 1.5* 1.6*  --  10.0 12.4* 8.6  NEUTROABS 1.5* 1.0*  --   --   --   --  8.2*  --   HGB 9.5* 7.0* 6.9* 7.7*  --  7.2* 7.2* 7.9*  HCT 25.1* 18.0* 17.6* 20.3*  --  19.8* 20.5* 22.5*  MCV 97.3 95.7 96.7 94.4  --  98.5 101.0* 101.8*  PLT 24* 14* 12* 11* 56* 62* 94* 166   Cardiac Enzymes:  Recent Labs Lab 08/30/12 1500  TROPONINI <0.30   BNP: No components found with this basename: POCBNP,  CBG: No results found for this basename: GLUCAP,  in the last 168 hours  Recent Results (from the past 240 hour(s))  CULTURE, BLOOD (ROUTINE X 2)     Status: None   Collection Time     08/30/12  4:39 PM      Result Value Range Status   Specimen Description BLOOD LEFT ARM   Final   Special Requests BOTTLES DRAWN AEROBIC AND ANAEROBIC [facility]   Final   Culture  Setup Time     Final   Value: 08/30/2012 22:18     Performed at [facility]   Culture     Final   Value: NO GROWTH 5 DAYS     Performed at [facility]   Report Status 09/05/2012 FINAL   Final  CULTURE, BLOOD (ROUTINE X 2)     Status: None   Collection Time    08/30/12  4:44 PM      Result Value Range Status   Specimen Description BLOOD RIGHT ARM   Final   Special Requests BOTTLES DRAWN AEROBIC AND ANAEROBIC [facility]   Final   Culture  Setup Time     Final   Value: 08/30/2012 22:18     Performed at [facility]   Culture     Final   Value: NO GROWTH 5 DAYS     Performed at [facility]   Report Status 09/05/2012 FINAL   Final  MRSA PCR SCREENING     Status: None   Collection Time    08/30/12  6:35 PM      Result Value Range Status   MRSA by PCR NEGATIVE  NEGATIVE Final   Comment:            The GeneXpert MRSA Assay (FDA     approved for NASAL specimens     only), is one component of a     comprehensive MRSA colonization     surveillance program. It is not     intended to diagnose MRSA  infection nor to guide or     monitor treatment for     MRSA infections.  URINE CULTURE     Status: None   Collection Time    08/31/12  3:01 AM      Result Value Range Status   Specimen Description URINE, RANDOM   Final   Special Requests NONE   Final   Culture  Setup Time     Final   Value: 08/31/2012 08:36     Performed at [facility] Count     Final   Value: NO GROWTH     Performed at [facility]   Culture     Final   Value: NO GROWTH     Performed at [facility]   Report Status 09/01/2012 FINAL   Final  CLOSTRIDIUM DIFFICILE BY PCR     Status: None   Collection Time    08/31/12  3:01 AM      Result Value Range Status   C difficile by pcr  NEGATIVE  NEGATIVE Final   Comment: Performed at [facility]  STOOL CULTURE     Status: None   Collection Time    09/03/12 11:28 AM      Result Value Range Status   Specimen Description PERIRECTAL   Final   Special Requests Immunocompromised   Final   Culture     Final   Value: Culture reincubated for better growth     Performed at [facility]   Report Status PENDING   Incomplete     Studies: Ct Head Wo Contrast  09/04/2012   *RADIOLOGY REPORT*  Clinical Data: New onset seizure  CT HEAD WITHOUT CONTRAST  Technique:  Contiguous axial images were obtained from the base of the skull through the vertex without contrast.  Comparison: 08/30/2012  Findings: Generalized atrophy.  Negative for acute infarct. Negative for hemorrhage or mass lesion.  Sinusitis with air-fluid level in the sphenoid sinus. Air-fluid level also in the right maxillary sinus.  IMPRESSION: No acute intracranial abnormality  Sinusitis.   Original Report Authenticated By: Janeece Riggers, M.D.    Scheduled Meds: . ipratropium  0.5 mg Nebulization Q6H   And  . albuterol  2.5 mg Nebulization Q6H  . antiseptic oral rinse  15 mL Mouth Rinse BID  . famotidine  20 mg Oral QHS  . feeding supplement  237 mL Oral BID BM  . folic acid (FOLVITE) IVPB  1 mg Intravenous Daily  . multivitamin with minerals  1 tablet Oral Daily  . potassium chloride  40 mEq Oral BID  . promethazine  12.5 mg Intravenous Once  . thiamine  500 mg Oral Daily   Continuous Infusions: . sodium chloride 50 mL/hr at 09/05/12 0450

## 2012-09-05 NOTE — Consult Note (Signed)
NEURO HOSPITALIST CONSULT NOTE    Reason for Consult: new onset seizure  HPI:                                                                                                                                          Eugene Jackson is an 42 y.o. male with a past medical history significant for HTN, ETOH abuse, admitted to the hospital with progressive, generalized weakness and found to have some metabolic derangements that at this moment are virtually resolved. In the evening of 09/03/12 he sustained a  1-1.5 minute generalized TC seizure witnessed by the nursing staff. No prior history of seizures and he said that he didn't have a warning before this seizure.  Eugene Jackson is completely amnestic about the event. No further seizures after receiving 2 mg IV ativan. CT brain unremarkable. He drinks heavy amount of alcohol almost on a daily basis and tells me that his last drink was the day prior to admission to the hospital. Denies use of cocaine, marihuana, benzodiazepines, narcotics, or tramadol. Denies HA, vertigo, double vision, difficulty swallowing, slurred speech, language or vision impairment. No recent fever, chills, or infection. No history of febrile seizures, CNS infection, severe head injury, or stroke. No family history of epilepsy. Normal development.     Past Medical History  Diagnosis Date  . Hypertension   . Broken femur     right  . PONV (postoperative nausea and vomiting)     Past Surgical History  Procedure Laterality Date  . Wrist surgery    . Joint replacement      bilateral hip replacement    History reviewed. No pertinent family history.  Family History:   Social History:  reports that he has been smoking Cigarettes.  He has a 13 pack-year smoking history. He has never used smokeless tobacco. He reports that  drinks alcohol. He reports that he does not use illicit drugs.  No Known Allergies  MEDICATIONS:                                                                                                                      I have reviewed the patient's current medications.   ROS:  History obtained from the patient and chart review.  General ROS: negative for - chills, fever, night sweats, weight gain or weight loss Psychological ROS: negative for - behavioral disorder, hallucinations, memory difficulties, mood swings or suicidal ideation Ophthalmic ROS: negative for - blurry vision, double vision, eye pain or loss of vision ENT ROS: negative for - epistaxis, nasal discharge, oral lesions, sore throat, tinnitus or vertigo Allergy and Immunology ROS: negative for - hives or itchy/watery eyes Hematological and Lymphatic ROS: negative for - bleeding problems, bruising or swollen lymph nodes Endocrine ROS: negative for - galactorrhea, hair pattern changes, polydipsia/polyuria or temperature intolerance Respiratory ROS: negative for - cough, hemoptysis, shortness of breath or wheezing Cardiovascular ROS: negative for - chest pain, dyspnea on exertion, edema or irregular heartbeat Gastrointestinal ROS: negative for - abdominal pain, diarrhea, hematemesis, nausea/vomiting or stool incontinence Genito-Urinary ROS: negative for - dysuria, hematuria, incontinence or urinary frequency/urgency Musculoskeletal ROS: negative for - joint swelling or muscular weakness Neurological ROS: as noted in HPI Dermatological ROS: negative for rash and skin lesion changes  Physical exam: pleasant male in no apparent distress. Blood pressure 114/62, pulse 72, temperature 97.6 F (36.4 C), temperature source Axillary, resp. rate 17, height 5\' 7"  (1.702 m), weight 67.3 kg (148 lb 5.9 oz), SpO2 98.00%. Head: normocephalic. Neck: supple, no bruits, no JVD. Cardiac: no murmurs. Lungs: clear. Abdomen: soft, no tender, no  mass. Extremities: no edema.  Neurologic Examination:                                                                                                      Mental Status: Alert, awake, oriented x 4, thought content appropriate.  Speech fluent without evidence of aphasia.  Able to follow 3 step commands without difficulty. Cranial Nerves: II: Discs flat bilaterally; Visual fields grossly normal, pupils equal, round, reactive to light and accommodation III,IV, VI: ptosis not present, extra-ocular motions intact bilaterally V,VII: smile symmetric, facial light touch sensation normal bilaterally VIII: hearing normal bilaterally IX,X: gag reflex present XI: bilateral shoulder shrug XII: midline tongue extension Motor: Right : Upper extremity   5/5    Left:     Upper extremity   5/5  Lower extremity   5/5     Lower extremity   5/5 Tone and bulk:normal tone throughout; no atrophy noted Sensory: Pinprick and light touch intact throughout, bilaterally Deep Tendon Reflexes:  1 all over. Plantars: Right: downgoing   Left: downgoing Coordination: Bilateral postural-intention tremor,  normal heel-to-shin test Gait:  No tested CV: pulses palpable throughout    No results found for this basename: cbc, bmp, coags, chol, tri, ldl, hga1c      Ct Head Wo Contrast  09/04/2012   *RADIOLOGY REPORT*  Clinical Data: New onset seizure  CT HEAD WITHOUT CONTRAST  Technique:  Contiguous axial images were obtained from the base of the skull through the vertex without contrast.  Comparison: 08/30/2012  Findings: Generalized atrophy.  Negative for acute infarct. Negative for hemorrhage or mass lesion.  Sinusitis with air-fluid level in the sphenoid sinus. Air-fluid level also in the  right maxillary sinus.  IMPRESSION: No acute intracranial abnormality  Sinusitis.   Original Report Authenticated By: Janeece Riggers, M.D.     Assessment/Plan: 42 y/o with new onset isolated GTC seizure, most likely provoked alcohol  withdrawal seizure. Unimpressive neuro-exam and normal CT brain. Will get EEG to complete work up. No need to initiate AED treatment at this moment. Will follow up.  Wyatt Portela, MD [facility] [phone]  09/05/2012, 1:06 AM

## 2012-09-05 NOTE — Procedures (Signed)
History: 42 yo M with seizure suspected due to EtOH withdrawal.   Sedation: None  Background: The EEG consists of intermixed alpha and beta activities. There is a well defined posterior dominant rhythm of 9 Hz that attenuates with eye opening.   Photic stimulation: Physiologic driving is present  EEG Abnormalities: none  Clinical Interpretation: This normal EEG is recorded in the waking and drowsy state. There was no seizure or seizure predisposition recorded on this study.   Ritta Slot, MD [facility] [phone]  If 7pm- 7am, please page neurology on call at [phone].

## 2012-09-05 NOTE — Progress Notes (Signed)
NEURO HOSPITALIST PROGRESS NOTE   SUBJECTIVE:                                                                                                                        Patient has had no further seizures while in hospital.  He is awake and oriented today.   OBJECTIVE:                                                                                                                           Vital signs in last 24 hours: Temp:  [97.6 F (36.4 C)-99.7 F (37.6 C)] 98.5 F (36.9 C) (09/03 0800) Pulse Rate:  [64-77] 64 (09/03 0348) Resp:  [14-24] 14 (09/03 0348) BP: (90-140)/(41-69) 105/64 mmHg (09/03 0348) SpO2:  [87 %-100 %] 99 % (09/03 0348) Weight:  [64.1 kg (141 lb 5 oz)] 64.1 kg (141 lb 5 oz) (09/03 0700)  Intake/Output from previous day: 09/02 0701 - 09/03 0700 In: 2010.2 [P.O.:240; I.V.:1520; IV Piggyback:250.2] Out: 4376 [Urine:4375; Stool:1] Intake/Output this shift:   Nutritional status: General  Past Medical History  Diagnosis Date  . Hypertension   . Broken femur     right  . PONV (postoperative nausea and vomiting)     Neurologic Exam:  Mental Status: Alert, oriented, thought content appropriate.  Speech fluent without evidence of aphasia.  Able to follow 3 step commands without difficulty. Cranial Nerves: II:  Visual fields grossly normal, pupils equal, round, reactive to light and accommodation III,IV, VI: ptosis not present, extra-ocular motions intact bilaterally V,VII: smile symmetric, facial light touch sensation normal bilaterally VIII: hearing normal bilaterally IX,X: gag reflex present XI: bilateral shoulder shrug XII: midline tongue extension Motor: Right : Upper extremity   5/5    Left:     Upper extremity   5/5  Lower extremity   5/5     Lower extremity   5/5 Tone and bulk:normal tone throughout; no atrophy noted Sensory: Pinprick and light touch intact throughout, bilaterally Deep Tendon Reflexes:  Right:  Upper Extremity   Left: Upper extremity   biceps (C-5 to C-6) 2/4   biceps (C-5 to C-6) 2/4 tricep (C7) 2/4    triceps (C7) 2/4 Brachioradialis (C6) 2/4  Brachioradialis (C6) 2/4  Lower Extremity Lower Extremity  quadriceps (L-2 to L-4) 2/4   quadriceps (L-2 to L-4) 2/4 Achilles (S1) 2/4   Achilles (S1) 2/4  Plantars: Right: downgoing   Left: downgoing Cerebellar: normal finger-to-nose,  normal heel-to-shin test CV: pulses palpable throughout    Lab Results: No results found for this basename: cbc, bmp, coags, chol, tri, ldl, hga1c   Lipid Panel No results found for this basename: CHOL, TRIG, HDL, CHOLHDL, VLDL, LDLCALC,  in the last 72 hours  Studies/Results: Ct Head Wo Contrast  09/04/2012   *RADIOLOGY REPORT*  Clinical Data: New onset seizure  CT HEAD WITHOUT CONTRAST  Technique:  Contiguous axial images were obtained from the base of the skull through the vertex without contrast.  Comparison: 08/30/2012  Findings: Generalized atrophy.  Negative for acute infarct. Negative for hemorrhage or mass lesion.  Sinusitis with air-fluid level in the sphenoid sinus. Air-fluid level also in the right maxillary sinus.  IMPRESSION: No acute intracranial abnormality  Sinusitis.   Original Report Authenticated By: Janeece Riggers, M.D.    MEDICATIONS                                                                                                                        Scheduled: . ipratropium  0.5 mg Nebulization Q6H   And  . albuterol  2.5 mg Nebulization Q6H  . antiseptic oral rinse  15 mL Mouth Rinse BID  . famotidine  20 mg Oral QHS  . feeding supplement  237 mL Oral BID BM  . folic acid (FOLVITE) IVPB  1 mg Intravenous Daily  . multivitamin with minerals  1 tablet Oral Daily  . potassium chloride  40 mEq Oral BID  . promethazine  12.5 mg Intravenous Once  . thiamine  500 mg Oral Daily    ASSESSMENT/PLAN:                                                                                                             42 y/o with new onset isolated GTC seizure, most likely provoked alcohol withdrawal seizure. Neuro exam normal today.  EEG pending . Continue to recommend no AED treatment at this moment.  If EEG is normal neurology will S/O.   Assessment and plan discussed with with attending physician and they are in agreement.    Felicie Morn PA-C [facility] [phone]  09/05/2012, 10:41 AM

## 2012-09-05 NOTE — Progress Notes (Signed)
Offsite EEG completed at [facility]. 

## 2012-09-06 DIAGNOSIS — R7989 Other specified abnormal findings of blood chemistry: Secondary | ICD-10-CM

## 2012-09-06 LAB — BASIC METABOLIC PANEL
CO2: 23 mEq/L (ref 19–32)
Calcium: 8 mg/dL — ABNORMAL LOW (ref 8.4–10.5)
Creatinine, Ser: 0.45 mg/dL — ABNORMAL LOW (ref 0.50–1.35)
Glucose, Bld: 103 mg/dL — ABNORMAL HIGH (ref 70–99)

## 2012-09-06 LAB — CBC
HCT: 22.8 % — ABNORMAL LOW (ref 39.0–52.0)
Hemoglobin: 7.8 g/dL — ABNORMAL LOW (ref 13.0–17.0)
MCH: 35.1 pg — ABNORMAL HIGH (ref 26.0–34.0)
MCV: 102.7 fL — ABNORMAL HIGH (ref 78.0–100.0)
RBC: 2.22 MIL/uL — ABNORMAL LOW (ref 4.22–5.81)

## 2012-09-06 MED ORDER — FOLIC ACID 1 MG PO TABS
1.0000 mg | ORAL_TABLET | Freq: Every day | ORAL | Status: DC
  Administered 2012-09-06 – 2012-09-07 (×2): 1 mg via ORAL
  Filled 2012-09-06 (×3): qty 1

## 2012-09-06 NOTE — Progress Notes (Signed)
CSW made aware of patient need for outpatient etoh abuse resources/

## 2012-09-06 NOTE — Progress Notes (Addendum)
[facility] PROGRESS NOTE  Eugene Jackson ZOX:[id number] DOB: 1970-07-27 DOA: 08/30/2012 PCP: Nonnie Done., MD  Brief narrative: 42 -year-old male with past medical history significant for alcohol abuse who presented to [facility] 08/30/2012 with complaints of progressive weakness, nausea vomiting. Patient was referred to ED for evaluation from his primary care physician office as he was found to have multiple critical lab abnormalities. Patient had sodium of 126, potassium of 2.9, phosphorus 1.4 and magnesium 1.3. White blood cell count was 2.6 and platelet count was 24. AST was 57 and ALT was within normal limits. Alcohol level and urine drug screen were not obtained at the time of the admission. Additionally, patient was found to be hypotensive and was admitted to intensive care unit and required pressor support until 09/02/2012. Hospital course has been complicated due to development of seizures. Patient had one seizure episode 09/04/2012, grand mal lasted about 1 minute with post ictal confusion. EEG was within normal limits. No further seizure episodes.  Assessment/Plan:   Principal Problem:  Acute alcohol intoxication  - Alcohol level and urine drug screen were not obtained at the time of the admission. Patient is a heavy drinker and was drinking up until arrival to emergency department on the day of the admission  - CIWA protocol ordered.Pt not in withdrawal - Continue folic acid, multivitamin and thiamine  - Continue antinausea medications as needed  Septic shock  - Patient was hypotensive on the admission and required pressor support. Patient is now hemodynamically stable and pressor support has been discontinued 09/02/2012  - Patient started on broad-spectrum antibiotics at the time of the admission, vancomycin and Zosyn. Lactic acid was within normal limits and procalcitonin level was 5.34 but trended down --> 0.93 --> 0.38 --> 0.13  - In addition, patient was on Cipro and Flagyl  for findings of pan colitis on CT abdomen. Please note that all antibiotics are now discontinued  Acute encephalopathy  - Secondary to acute alcohol intoxication  - CT head showed no acute intracranial findings  - Ammonia level was within normal limits at the time of the admission  - Patient is oriented to time, place and person  Active Problems:  Hyponatremia, Hypokalemia, Hypomagnesemia, Hypophosphatemia  - Electrolyte abnormality secondary to alcohol abuse  - Sodium stable at 130-132; potassium 3.9 this morning - Magnesium and phosphorus are within normal limits  Elevated lipase  - Likely related to alcohol abuse, possible pancreatitis  - Lipase level was 279 on the admission and repeat level 184  - Patient has no abdominal pain and tolerates regular diet  Nausea, vomiting, diarrhea  - Based on CT abdomen findings consistent with pancolitis  - Patient has completed course of Flagyl and ciprofloxacin  - C. difficile was negative on the admission Pancytopenia  - Secondary to alcohol-induced bone marrow toxicity  Anemia of chronic disease  - With no acute blood loss  - Hemoglobin stable at 7.8 Thrombocytopenia  - Related to bone marrow toxicity due to alcohol abuse  - Platelet count is within normal limits  Neutropenia  - Secondary to bone marrow toxicity related to alcohol abuse  - White blood cell count is now within normal limits  DIC (disseminated intravascular coagulation)  - Secondary to alcohol induced bone marrow toxicity  Seizures  - Likely alcohol related seizures  - EEG within normal limits Abnormal liver function tests  - Secondary to alcohol abuse  - AST elevated at 51 and ALT within normal limits  - Alkaline phosphatase within normal  limits  Moderate protein-calorie malnutrition  - Secondary to history of alcohol abuse  - Continue feeding supplements  Alcohol abuse  - Counseled on cessation   Code Status: Full code  Family Communication: No family at the  bedside  Disposition Plan: home in next 24 hours  Manson Passey, MD  [facility]  Pager [phone]   Consultants:  Neurology  Patient was under critical care medicine team until 09/04/2012. Patient is under [facility] care starting 09/05/2012 Procedures:  Plan for EEG today Antibiotics:  Flagyl 8/28>>>9/1  Vanco 8/28>>>8/29  Zosyn 8/28>>>8/29  Cipro 8/28 (colitis)>>>9/2 Stool Culture 9/1>>; C-Diff 8/29>>>neg   If 7PM-7AM, please contact night-coverage [email or web address] Password [email or web address] 09/06/2012, 2:26 PM   LOS: 7 days    HPI/Subjective: No acute overnight events.  Objective: Filed Vitals:   09/06/12 0615 09/06/12 0800 09/06/12 0850 09/06/12 1200  BP:   98/56   Pulse:   65   Temp:  98.5 F (36.9 C)  98.2 F (36.8 C)  TempSrc:  Oral  Oral  Resp:   15   Height:      Weight: 63.9 kg (140 lb 14 oz)     SpO2:   100%     Intake/Output Summary (Last 24 hours) at 09/06/12 1426 Last data filed at 09/06/12 1100  Gross per 24 hour  Intake   1890 ml  Output   3225 ml  Net  -1335 ml    Exam:   General:  Pt is alert, follows commands appropriately, not in acute distress  Cardiovascular: Regular rate and rhythm, S1/S2, no murmurs, no rubs, no gallops  Respiratory: Clear to auscultation bilaterally, no wheezing, no crackles, no rhonchi  Abdomen: Soft, non tender, non distended, bowel sounds present, no guarding  Extremities: No edema, pulses DP and PT palpable bilaterally  Neuro: Grossly nonfocal  Data Reviewed: Basic Metabolic Panel:  Recent Labs Lab 09/01/12 0500  09/02/12 0300  09/03/12 0400  09/04/12 0510 09/04/12 0840 09/04/12 1600 09/04/12 2120 09/05/12 0440 09/05/12 0630 09/06/12 0345  NA 126*  < > 127*  < >  --   < >  --  134* 135 133*  --  132* 133*  K 2.9*  < > 3.1*  < >  --   < >  --  3.3* 3.4* 3.1*  --  3.1* 3.9  CL 89*  < > 93*  < >  --   < >  --  105 102 102  --  101 103  CO2 28  < > 27  < >  --   < >  --  22 18* 24  --  24 23   GLUCOSE 143*  < > 205*  < >  --   < >  --  101* 127* 108*  --  98 103*  BUN 5*  < > <3*  < >  --   < >  --  5* 5* 4*  --  4* 4*  CREATININE 0.40*  < > 0.40*  < >  --   < >  --  0.42* 0.52 0.44*  --  0.42* 0.45*  CALCIUM 7.2*  < > 7.2*  < >  --   < >  --  7.1* 7.7* 7.5*  --  7.8* 8.0*  MG 1.6  --  1.3*  --  1.6  --  1.8  --   --   --  1.5  --   --   PHOS 1.4*  --  1.1*  --  1.7*  --  2.5  --   --   --  4.2  --   --   < > = values in this interval not displayed. Liver Function Tests:  Recent Labs Lab 08/30/12 1500 08/31/12 0335  AST 51* 54*  ALT 23 18  ALKPHOS 93 67  BILITOT 1.8* 1.4*  PROT 6.6 4.9*  ALBUMIN 3.3* 2.5*    Recent Labs Lab 08/30/12 1500 08/31/12 0335  LIPASE 279* 184*    Recent Labs Lab 08/30/12 1537  AMMONIA 47   CBC:  Recent Labs Lab 08/30/12 1500 08/30/12 2307  08/31/12 1100 08/31/12 1443 09/03/12 1100 09/04/12 0510 09/05/12 0630 09/06/12 0345  WBC 2.6* 1.7*  < > 1.6*  --  10.0 12.4* 8.6 8.0  NEUTROABS 1.5* 1.0*  --   --   --   --  8.2*  --   --   HGB 9.5* 7.0*  < > 7.7*  --  7.2* 7.2* 7.9* 7.8*  HCT 25.1* 18.0*  < > 20.3*  --  19.8* 20.5* 22.5* 22.8*  MCV 97.3 95.7  < > 94.4  --  98.5 101.0* 101.8* 102.7*  PLT 24* 14*  < > 11* 56* 62* 94* 166 248  < > = values in this interval not displayed. Cardiac Enzymes:  Recent Labs Lab 08/30/12 1500  TROPONINI <0.30   BNP: No components found with this basename: POCBNP,  CBG: No results found for this basename: GLUCAP,  in the last 168 hours  Recent Results (from the past 240 hour(s))  CULTURE, BLOOD (ROUTINE X 2)     Status: None   Collection Time    08/30/12  4:39 PM      Result Value Range Status   Specimen Description BLOOD LEFT ARM   Final   Special Requests BOTTLES DRAWN AEROBIC AND ANAEROBIC [facility]   Final   Culture  Setup Time     Final   Value: 08/30/2012 22:18     Performed at [facility]   Culture     Final   Value: NO GROWTH 5 DAYS     Performed at [facility]   Report Status 09/05/2012 FINAL   Final  CULTURE, BLOOD (ROUTINE X 2)     Status: None   Collection Time    08/30/12  4:44 PM      Result Value Range Status   Specimen Description BLOOD RIGHT ARM   Final   Special Requests BOTTLES DRAWN AEROBIC AND ANAEROBIC [facility]   Final   Culture  Setup Time     Final   Value: 08/30/2012 22:18     Performed at [facility]   Culture     Final   Value: NO GROWTH 5 DAYS     Performed at [facility]   Report Status 09/05/2012 FINAL   Final  MRSA PCR SCREENING     Status: None   Collection Time    08/30/12  6:35 PM      Result Value Range Status   MRSA by PCR NEGATIVE  NEGATIVE Final   Comment:            The GeneXpert MRSA Assay (FDA     approved for NASAL specimens     only), is one component of a     comprehensive MRSA colonization     surveillance program. It is not     intended to diagnose MRSA  infection nor to guide or     monitor treatment for     MRSA infections.  URINE CULTURE     Status: None   Collection Time    08/31/12  3:01 AM      Result Value Range Status   Specimen Description URINE, RANDOM   Final   Special Requests NONE   Final   Culture  Setup Time     Final   Value: 08/31/2012 08:36     Performed at [facility] Count     Final   Value: NO GROWTH     Performed at [facility]   Culture     Final   Value: NO GROWTH     Performed at [facility]   Report Status 09/01/2012 FINAL   Final  CLOSTRIDIUM DIFFICILE BY PCR     Status: None   Collection Time    08/31/12  3:01 AM      Result Value Range Status   C difficile by pcr NEGATIVE  NEGATIVE Final   Comment: Performed at [facility]  STOOL CULTURE     Status: None   Collection Time    09/03/12 11:28 AM      Result Value Range Status   Specimen Description PERIRECTAL   Final   Special Requests Immunocompromised   Final   Culture     Final   Value: NO SUSPICIOUS COLONIES, CONTINUING TO HOLD      Performed at [facility]   Report Status PENDING   Incomplete     Studies: Ct Head Wo Contrast  09/04/2012   *RADIOLOGY REPORT*  Clinical Data: New onset seizure  CT HEAD WITHOUT CONTRAST  Technique:  Contiguous axial images were obtained from the base of the skull through the vertex without contrast.  Comparison: 08/30/2012  Findings: Generalized atrophy.  Negative for acute infarct. Negative for hemorrhage or mass lesion.  Sinusitis with air-fluid level in the sphenoid sinus. Air-fluid level also in the right maxillary sinus.  IMPRESSION: No acute intracranial abnormality  Sinusitis.   Original Report Authenticated By: Janeece Riggers, M.D.    Scheduled Meds: . ipratropium  0.5 mg Nebulization Q6H   And  . albuterol  2.5 mg Nebulization Q6H  . antiseptic oral rinse  15 mL Mouth Rinse BID  . famotidine  20 mg Oral QHS  . feeding supplement  237 mL Oral BID BM  . folic acid  1 mg Oral Daily  . multivitamin with minerals  1 tablet Oral Daily  . promethazine  12.5 mg Intravenous Once  . thiamine  500 mg Oral Daily   Continuous Infusions: . sodium chloride 50 mL/hr at 09/06/12 1126

## 2012-09-06 NOTE — Evaluation (Signed)
Physical Therapy Evaluation Patient Details Name: Eugene Jackson MRN: [id number] DOB: [birth year] Today's Date: 09/06/2012 Time: 2536-6440 PT Time Calculation (min): 18 min  PT Assessment / Plan / Recommendation History of Present Illness  42 y.o. male with a past medical history significant for HTN, ETOH abuse, admitted to the hospital with progressive, generalized weakness and found to have some metabolic derangements that at this moment are virtually resolved.  Clinical Impression  On eval, pt required Min guard assist for mobility-stand pivot to recliner with RW. Pt was able to tolerate some weightbearing on R LE-no c/o pain. Pt states he has plenty of assist at home. He also states he has male friends that will carry him up steps to enter home (this is how pt states he has being getting into/out of home). Discussed HH follow up-pt declines this. Pt states he has not followed up with ortho MD since fx back in June (went home and did not have any PT after that hospital admission).     PT Assessment  Patient needs continued PT services    Follow Up Recommendations   (Pt declines HHPT. )    Does the patient have the potential to tolerate intense rehabilitation      Barriers to Discharge        Equipment Recommendations  None recommended by PT    Recommendations for Other Services     Frequency Min 3X/week    Precautions / Restrictions Precautions Precautions: Fall Restrictions Weight Bearing Restrictions: No Other Position/Activity Restrictions: Pt states he had fracture-R LE back in 06/2012-no surgery. has not followed up with ortho since then. States he takes a few steps/hops in home/stands at sink but uses wheelchair mostly. States he can put a little weight through R LE.   Pertinent Vitals/Pain No c/o pain during session      Mobility  Bed Mobility Bed Mobility: Supine to Sit Supine to Sit: 6: Modified independent (Device/Increase time) Transfers Transfers: Sit to  Stand;Stand to [employer] Transfers Sit to Stand: 4: Min guard;From bed Stand to Sit: 4: Min guard;To chair/3-in-1 Stand Pivot Transfers: 4: Min guard Details for Transfer Assistance: Stand pivot with RW, bed to recliner. Pt able to weightbear on R LE without pain/difficulty. VCs safety.  Ambulation/Gait Ambulation/Gait Assistance: Not tested (comment) Ambulation/Gait Assistance Details: Deferred due to pt has been mostly non ambulatory at home since 06/2012. Uses wheelchair in home.     Exercises     PT Diagnosis: Generalized weakness  PT Problem List: Decreased mobility;Decreased knowledge of use of DME PT Treatment Interventions: Functional mobility training;Therapeutic activities;Therapeutic exercise;DME instruction;Patient/family education     PT Goals(Current goals can be found in the care plan section) Acute Rehab PT Goals Patient Stated Goal: home later today PT Goal Formulation: With patient Time For Goal Achievement: 09/20/12 Potential to Achieve Goals: Fair  Visit Information  Last PT Received On: 09/06/12 Assistance Needed: +1 History of Present Illness: 42 y.o. male with a past medical history significant for HTN, ETOH abuse, admitted to the hospital with progressive, generalized weakness and found to have some metabolic derangements that at this moment are virtually resolved.       Prior Functioning  Home Living Family/patient expects to be discharged to:: Private residence Living Arrangements: Alone Type of Home: Mobile home Home Access: Stairs to enter Entrance Stairs-Number of Steps: 4 Entrance Stairs-Rails: Right;Left Home Layout: One level Home Equipment: Walker - 2 wheels;Wheelchair - manual;Bedside commode Additional Comments: L Nurse, learning disability  Cognition Arousal/Alertness: Awake/alert Behavior During Therapy: WFL for tasks assessed/performed Overall Cognitive Status: Within Functional Limits for tasks assessed    Extremity/Trunk  Assessment Upper Extremity Assessment Upper Extremity Assessment: LUE deficits/detail;Generalized weakness LUE Deficits / Details: wrist splint s/p surgery in 06/2012 Lower Extremity Assessment Lower Extremity Assessment: Generalized weakness;RLE deficits/detail RLE Deficits / Details: history of fracture 06/2012. No follow upt with ortho MD since then. Pt has been home and states he can put a little weight through LE.    Balance    End of Session PT - End of Session Equipment Utilized During Treatment: Gait belt Activity Tolerance: Patient tolerated treatment well Patient left: in chair;with call bell/phone within reach  GP     Rebeca Alert, MPT Pager: [phone]

## 2012-09-06 NOTE — Progress Notes (Signed)
Clinical Social Work Department BRIEF PSYCHOSOCIAL ASSESSMENT 09/06/2012  Patient:  Eugene Jackson, Eugene Jackson     Account Number:  [id number]     Admit date:  08/30/2012  Clinical Social Worker:  Jacelyn Grip  Date/Time:  09/06/2012 12:17 PM  Referred by:  Care Management  Date Referred:  09/06/2012 Referred for  Substance Abuse   Other Referral:   Interview type:  Patient Other interview type:    PSYCHOSOCIAL DATA Living Status:  ALONE Admitted from facility:   Level of care:   Primary support name:  Eugene Jackson [phone] Primary support relationship to patient:  FAMILY Degree of support available:   unknown at this time, no family present at bedside    CURRENT CONCERNS Current Concerns  Substance Abuse   Other Concerns:    SOCIAL WORK ASSESSMENT / PLAN CSW received notification from [facility] that pt likely being d/c today and MD requesting outpatient ETOH resources be provided to pt.    CSW met with pt at bedside. CSW inquired with pt substance abuse. Pt admits to use of alcohol. Pt guarded about the amount of alcohol he consumes daily. [place], but is familiar with the substance abuse resources in the area from past experience. Pt discussed that he was doing well in re: to his use of alcohol and then he injured his hand and his hip and was out of work which at that time he began consuming alcohol again. Pt discussed that he is eager to return to work and CSW discussed assistance provided through [employer] in re: to substance abuse. Pt reports that he visited [facility] ([facility]) in the past and attended Alcholics Anonymous (AA) and will consider following up with these resources.    CSW inquired with pt about how easy he felt it would be to seek assistance and start toward recovery of alcohol use. Pt reports that his sister will be staying with him once his discharges from the hospital and he feels that his  sister will be able to assist him in maintaining sobreity as she does not tolerate him drinking. CSW encouraged pt to follow up with [facility] and AA meetings and provided list of intensive outpatient substance abuse resources and inpatient substance abuse resources.    No further social work needs identified at this time.    CSW signing off.   Assessment/plan status:  No Further Intervention Required Other assessment/ plan:   Information/referral to community resources:   Substance Abuse Resources provided; pt has been connected to with  [facility] in the past    PATIENT'S/FAMILY'S RESPONSE TO PLAN OF CARE: Pt alert and oriented x 4. Pt guarded about current use, but states that he is aware of resources in the community. Pt does not appear highly motivated for change at this time. Further resources provided at bedside.     Jacklynn Lewis, MSW, LCSWA  Clinical Social Work [phone]

## 2012-09-06 NOTE — Progress Notes (Signed)
NUTRITION FOLLOW UP  Intervention:   Change supplement to [employer] once daily Pt receiving Multivitamin, thiamine, and folic acid  Nutrition Dx:   Predicted suboptimal intake related to nausea, vomiting, and mental status as evidenced by pt sleeping through meals; discontinued  New Nutrition Dx: Unintentional wt loss related to alcohol abuse as evidenced by 11% wt loss in 3 weeks per pt report  Goal:   Pt to meet >/= 90% of their estimated nutrition needs; likely being met  Monitor:   PO intake Weight Labs  Assessment:   Pt states he is feeling better and eating better. He reports eating >75% of 3 meals daily; only drinking about 25% of Ensure supplements. Pt reports that 3 weeks PTA he weighed 150 lbs but, due to nausea and vomiting he lost weight. Pt's weight on admission was 134 lbs - 11% wt loss. Encouraged healthful eating. Gave pt "Sobriety Nutrition Therapy" handout from the Academy of [employer]etics. Reviewed handout with pt.   Height: Ht Readings from Last 1 Encounters:  08/30/12 5\' 7"  (1.702 m)    Weight Status:   Wt Readings from Last 1 Encounters:  09/06/12 140 lb 14 oz (63.9 kg)    Re-estimated needs:  Kcal: 1800-2000 Protein: 70-80 grams Fluid: 2.1 L/day   Skin: WDL  Diet Order: General   Intake/Output Summary (Last 24 hours) at 09/06/12 1542 Last data filed at 09/06/12 1100  Gross per 24 hour  Intake   1890 ml  Output   3225 ml  Net  -1335 ml    Last BM: 9/4   Labs:   Recent Labs Lab 09/03/12 0400  09/04/12 0510  09/04/12 2120 09/05/12 0440 09/05/12 0630 09/06/12 0345  NA  --   < >  --   < > 133*  --  132* 133*  K  --   < >  --   < > 3.1*  --  3.1* 3.9  CL  --   < >  --   < > 102  --  101 103  CO2  --   < >  --   < > 24  --  24 23  BUN  --   < >  --   < > 4*  --  4* 4*  CREATININE  --   < >  --   < > 0.44*  --  0.42* 0.45*  CALCIUM  --   < >  --   < > 7.5*  --  7.8* 8.0*  MG 1.6  --  1.8  --   --  1.5   --   --   PHOS 1.7*  --  2.5  --   --  4.2  --   --   GLUCOSE  --   < >  --   < > 108*  --  98 103*  < > = values in this interval not displayed.  CBG (last 3)  No results found for this basename: GLUCAP,  in the last 72 hours  Scheduled Meds: . ipratropium  0.5 mg Nebulization Q6H   And  . albuterol  2.5 mg Nebulization Q6H  . antiseptic oral rinse  15 mL Mouth Rinse BID  . famotidine  20 mg Oral QHS  . feeding supplement  237 mL Oral BID BM  . folic acid  1 mg Oral Daily  . multivitamin with minerals  1 tablet Oral Daily  . promethazine  12.5 mg Intravenous  Once  . thiamine  500 mg Oral Daily    Continuous Infusions: . sodium chloride 50 mL/hr at 09/06/12 1126    Ian Malkin RD, LDN Inpatient Clinical Dietitian Pager: [phone] After Hours Pager: [phone]

## 2012-09-07 LAB — STOOL CULTURE

## 2012-09-07 LAB — BASIC METABOLIC PANEL
BUN: 5 mg/dL — ABNORMAL LOW (ref 6–23)
CO2: 22 mEq/L (ref 19–32)
Calcium: 8.3 mg/dL — ABNORMAL LOW (ref 8.4–10.5)
Creatinine, Ser: 0.49 mg/dL — ABNORMAL LOW (ref 0.50–1.35)
GFR calc Af Amer: >90 mL/min (ref >90)

## 2012-09-07 LAB — CBC
MCH: 35.1 pg — ABNORMAL HIGH (ref 26.0–34.0)
MCV: 102.6 fL — ABNORMAL HIGH (ref 78.0–100.0)
Platelets: 349 10*3/uL (ref 150–400)
RDW: 19.6 % — ABNORMAL HIGH (ref 11.5–15.5)

## 2012-09-07 MED ORDER — CYCLOBENZAPRINE HCL 10 MG PO TABS: 10.0000 mg | ORAL_TABLET | Freq: Two times a day (BID) | ORAL | Status: DC | PRN

## 2012-09-07 MED ORDER — DOCUSATE SODIUM 100 MG PO CAPS: 100.0000 mg | ORAL_CAPSULE | Freq: Two times a day (BID) | ORAL | Status: DC | PRN

## 2012-09-07 MED ORDER — AMLODIPINE BESYLATE 10 MG PO TABS: 10.0000 mg | ORAL_TABLET | Freq: Every day | ORAL | Status: DC

## 2012-09-07 MED ORDER — FOLIC ACID 1 MG PO TABS: 1.0000 mg | ORAL_TABLET | Freq: Every day | ORAL | Status: DC

## 2012-09-07 MED ORDER — THIAMINE HCL 500 MG PO TABS: 500.0000 mg | ORAL_TABLET | Freq: Every day | ORAL | Status: DC

## 2012-09-07 MED ORDER — FAMOTIDINE 20 MG PO TABS: 20.0000 mg | ORAL_TABLET | Freq: Every day | ORAL | Status: DC

## 2012-09-07 MED ORDER — PROMETHAZINE HCL 25 MG PO TABS: 25.0000 mg | ORAL_TABLET | Freq: Four times a day (QID) | ORAL | Status: DC | PRN

## 2012-09-07 MED ORDER — ADULT MULTIVITAMIN W/MINERALS CH: 1.0000 | ORAL_TABLET | Freq: Every day | ORAL | Status: DC

## 2012-09-07 MED ORDER — OXYCODONE HCL 5 MG PO TABS: 10.0000 mg | ORAL_TABLET | Freq: Four times a day (QID) | ORAL | Status: DC | PRN

## 2012-09-07 NOTE — Discharge Summary (Signed)
Physician Discharge Summary  Eugene Jackson ZOX:[id number] DOB: 07/25/1970 DOA: 08/30/2012  PCP: Nonnie Done., MD  Admit date: 08/30/2012 Discharge date: 09/07/2012  Recommendations for Outpatient Follow-up:  1. please note that you were hospitalized for acute alcohol intoxication. You had one episode of seizure which is most likely secondary to alcohol intoxication/withdrawal. There was no seizure activity noted on EEG study done during this hospital stay.  2. please abstain from alcohol use/abuse.  3. continue taking multivitamin, folic acid and thiamine for at least additional one month after this discharge  4. we have prescribed Norvasc 10 mg daily which you were taking before but check your blood pressure prior to taking this medication. If your systolic blood pressure (the first number on the monitor) is less than 120 please do not take this medication.   Discharge Diagnoses:  Principal Problem:   Acute alcohol intoxication Active Problems:   Encephalopathy acute   Alcohol abuse   Hyponatremia   Hypokalemia   Elevated lipase   Pancytopenia   Thrombocytopenia   DIC (disseminated intravascular coagulation)   Anemia of chronic disease   Neutropenia   Abnormal liver function tests   Moderate protein-calorie malnutrition   Seizures   Hypomagnesemia   Hypophosphatemia    Discharge Condition: Medically stable for discharge home today. Patient has declined home health physical therapy all of him at home. Patient was seen by social worker for alcohol abuse rehabilitation and he reported he is not willing to try rehabilitation at this time and he can do it on his own. Please note that patient has capacity to make choices about his medical treatment decisions.  Diet recommendation: As tolerated  History of present illness:  42 -year-old male with past medical history significant for alcohol abuse who presented to [facility] 08/30/2012 with complaints of progressive weakness, nausea  vomiting. Patient was referred to ED for evaluation from his primary care physician office as he was found to have multiple critical lab abnormalities. Patient had sodium of 126, potassium of 2.9, phosphorus 1.4 and magnesium 1.3. White blood cell count was 2.6 and platelet count was 24. AST was 57 and ALT was within normal limits. Alcohol level and urine drug screen were not obtained at the time of the admission. Additionally, patient was found to be hypotensive and was admitted to intensive care unit and required pressor support until 09/02/2012. Hospital course has been complicated due to development of seizures. Patient had one seizure episode 09/04/2012, grand mal lasted about 1 minute with post ictal confusion. EEG was within normal limits. No further seizure episodes.   Assessment/Plan:   Principal Problem:  Acute alcohol intoxication  - Alcohol level and urine drug screen were not obtained at the time of the admission. Patient is a heavy drinker and was drinking up until arrival to emergency department on the day of the admission  - CIWA protocol was ordered during the hospital stay. No withdrawals reported  - Continue folic acid, multivitamin and thiamine - prescriptions provided - Continue antinausea medications as needed - Phenergan prescription provided Septic shock  - Patient was hypotensive on the admission and required pressor support. Patient is now hemodynamically stable and pressor support has been discontinued 09/02/2012  - Patient started on broad-spectrum antibiotics at the time of the admission, vancomycin and Zosyn. Lactic acid was within normal limits and procalcitonin level was 5.34 but trended down --> 0.93 --> 0.38 --> 0.13  - In addition, patient was on Cipro and Flagyl for findings of pan colitis  on CT abdomen. Please note that all antibiotics are now discontinued and patient does not need antibiotics at the time of discharge. Acute encephalopathy  - Secondary to acute  alcohol intoxication  - CT head showed no acute intracranial findings  - Ammonia level was within normal limits at the time of the admission  - Patient is oriented to time, place and person; patient has capacity to make decisions about his medical treatment Active Problems:  Hyponatremia, Hypokalemia, Hypomagnesemia, Hypophosphatemia  - Electrolyte abnormality secondary to alcohol abuse  - Sodium stable at 130-132; potassium 3.9 for past 48 hours - Magnesium and phosphorus are within normal limits  Elevated lipase  - Likely related to alcohol abuse, possible pancreatitis  - Lipase level was 279 on the admission and repeat level 184  - Patient has no abdominal pain and tolerates regular diet for past 48 hours Nausea, vomiting, diarrhea  - Based on CT abdomen findings consistent with pancolitis  - Patient has completed course of Flagyl and ciprofloxacin  - C. difficile was negative on the admission  Pancytopenia  - Secondary to alcohol-induced bone marrow toxicity  Anemia of chronic disease  - With no acute blood loss  - Hemoglobin stable at 7.8 and 8 Thrombocytopenia  - Related to bone marrow toxicity due to alcohol abuse  - Platelet count is within normal limits  Neutropenia  - Secondary to bone marrow toxicity related to alcohol abuse  - White blood cell count is now within normal limits  DIC (disseminated intravascular coagulation)  - Secondary to alcohol induced bone marrow toxicity  Seizures  - Likely alcohol related seizures  - EEG within normal limits  Abnormal liver function tests  - Secondary to alcohol abuse  - AST elevated at 51 and ALT within normal limits  - Alkaline phosphatase within normal limits  Moderate protein-calorie malnutrition  - Secondary to history of alcohol abuse  - Continue feeding supplements  Alcohol abuse  - Counseled on cessation   Code Status: Full code  Family Communication: No family at the bedside   Manson Passey, MD  [facility]  Pager [phone]   Consultants:  Neurology  Patient was under critical care medicine team until 09/04/2012. [facility] is under [facility] care starting 09/05/2012 Procedures:  Plan for EEG today Antibiotics:  Flagyl 8/28>>>9/1  Vanco 8/28>>>8/29  Zosyn 8/28>>>8/29  Cipro 8/28 (colitis)>>>9/2; C-Diff 8/29>>>neg   Signed:  Manson Passey, MD  [facility] 09/07/2012, 11:28 AM  Pager #: [phone]   Discharge Exam: Filed Vitals:   09/07/12 0835  BP: 95/50  Pulse: 86  Temp:   Resp: 17   Filed Vitals:   09/07/12 0405 09/07/12 0800 09/07/12 0806 09/07/12 0835  BP: 132/74   95/50  Pulse: 67   86  Temp:  98.8 F (37.1 C)    TempSrc:  Oral    Resp: 12   17  Height:      Weight:      SpO2: 99%  99% 99%    General: Pt is alert, follows commands appropriately, not in acute distress Cardiovascular: Regular rate and rhythm, S1/S2 +, no murmurs, no rubs, no gallops Respiratory: Clear to auscultation bilaterally, no wheezing, no crackles, no rhonchi Abdominal: Soft, non tender, non distended, bowel sounds +, no guarding Extremities: no edema, no cyanosis, pulses palpable bilaterally DP and PT Neuro: Grossly nonfocal  Discharge Instructions  Discharge Orders   Future Orders Complete By Expires   Call MD for:  difficulty breathing, headache or visual  disturbances  As directed    Call MD for:  persistant dizziness or light-headedness  As directed    Call MD for:  persistant nausea and vomiting  As directed    Call MD for:  severe uncontrolled pain  As directed    Diet - low sodium heart healthy  As directed    Discharge instructions  As directed    Comments:     1. please note that you were hospitalized for acute alcohol intoxication. You had one episode of seizure which is most likely secondary to alcohol intoxication/withdrawal. There was no seizure activity noted on EEG study done during this hospital stay. 2. please abstain from alcohol  use/abuse. 3. continue taking multivitamin, folic acid and thiamine for at least additional one month after this discharge 4. we have prescribed Norvasc 10 mg daily which you were taking before but check your blood pressure prior to taking this medication. If your systolic blood pressure (the first number on the monitor) is less than 120 please do not take this medication.   Increase activity slowly  As directed        Medication List    STOP taking these medications       methocarbamol 500 MG tablet  Commonly known as:  ROBAXIN      TAKE these medications       amLODipine 10 MG tablet  Commonly known as:  NORVASC  Take 1 tablet (10 mg total) by mouth daily.     cyclobenzaprine 10 MG tablet  Commonly known as:  FLEXERIL  Take 1 tablet (10 mg total) by mouth 2 (two) times daily as needed for muscle spasms.     docusate sodium 100 MG capsule  Commonly known as:  COLACE  Take 1 capsule (100 mg total) by mouth 2 (two) times daily as needed for constipation.     famotidine 20 MG tablet  Commonly known as:  PEPCID  Take 1 tablet (20 mg total) by mouth at bedtime.     folic acid 1 MG tablet  Commonly known as:  FOLVITE  Take 1 tablet (1 mg total) by mouth daily.     multivitamin with minerals Tabs tablet  Take 1 tablet by mouth daily.     oxyCODONE 5 MG immediate release tablet  Commonly known as:  Oxy IR/ROXICODONE  Take 2 tablets (10 mg total) by mouth every 6 (six) hours as needed for pain.     promethazine 25 MG tablet  Commonly known as:  PHENERGAN  Take 1 tablet (25 mg total) by mouth every 6 (six) hours as needed for nausea.     thiamine 500 MG tablet  Take 1 tablet (500 mg total) by mouth daily.           Follow-up Information   Follow up with Nonnie Done., MD.   Specialty:  Family Medicine   Contact information:   [address] [place] [address] [phone]       Follow up with SLATOSKY,JOHN J., MD In 1 week.   Specialty:  Family Medicine    Contact information:   [address] [place] [address] [phone]        The results of significant diagnostics from this hospitalization (including imaging, microbiology, ancillary and laboratory) are listed below for reference.    Significant Diagnostic Studies: Dg Chest 1 View  08/30/2012   *RADIOLOGY REPORT*  Clinical Data: Dizziness  CHEST - 1 VIEW  Comparison: 12/12/2006  Findings: Cardiomediastinal  silhouette is stable.  No acute infiltrate or pleural effusion.  No pulmonary edema.  IMPRESSION: No active disease.  No significant change.   Original Report Authenticated By: Natasha Mead, M.D.   Ct Head Wo Contrast  09/04/2012   *RADIOLOGY REPORT*  Clinical Data: New onset seizure  CT HEAD WITHOUT CONTRAST  Technique:  Contiguous axial images were obtained from the base of the skull through the vertex without contrast.  Comparison: 08/30/2012  Findings: Generalized atrophy.  Negative for acute infarct. Negative for hemorrhage or mass lesion.  Sinusitis with air-fluid level in the sphenoid sinus. Air-fluid level also in the right maxillary sinus.  IMPRESSION: No acute intracranial abnormality  Sinusitis.   Original Report Authenticated By: Janeece Riggers, M.D.   Ct Head Wo Contrast  08/30/2012   *RADIOLOGY REPORT*  Clinical Data:  History of confusion.  History of altered gait.  CT HEAD WITHOUT CONTRAST  Technique: Contiguous axial images were obtained from the base of the skull through the vertex without contrast  Comparison:  None  Findings:  There is no evidence of brain mass, brain hemorrhage, or acute infarction.  The ventricular system is normal size and shape.  There is no evidence of shift of midline structures, parenchymal lesion, or subdural or epidural hematoma.  The calvarium is intact.  Mastoids are well aerated.  There is mucosal thickening involving the medial aspect of the right maxillary sinus.  There is an oval opacity which may reflect a small mucous retention cyst or  polyp.  No other significant mucosal thickening is seen in the paranasal sinuses.  No air fluid levels are seen.  No sinus expansion or bony destruction is evident.  IMPRESSION: There is no evidence of brain mass, brain hemorrhage, or acute infarction.  No acute or active brain process is seen.  No skull lesion is evident.  No sinusitis is evident.  There is mucosal thickening involving the medial aspect of the right maxillary sinus.  There is an oval opacity which may reflect a small mucous retention cyst or polyp.   Original Report Authenticated By: Onalee Hua Call   Ct Abdomen Pelvis W Contrast  08/30/2012   *RADIOLOGY REPORT*  Clinical Data: Sepsis, vomiting, diarrhea  CT ABDOMEN AND PELVIS WITH CONTRAST  Technique:  Multidetector CT imaging of the abdomen and pelvis was performed following the standard protocol during bolus administration of intravenous contrast.  Contrast: 80 ml Omnipaque-300 IV  Comparison: None.  Findings: Lung bases are essentially clear.  Moderate hepatic steatosis with focal fatty sparing in the gallbladder fossa.  Gallbladder is distended but without associated inflammatory changes.  No intrahepatic or extrahepatic ductal dilatation.  Kidneys are within normal limits.  No hydronephrosis.  No evidence of bowel obstruction.  Normal appendix.  Wall thickening involving the majority of the colon and possibly the terminal ileum, suggesting infectious/inflammatory enterocolitis. Sigmoid colon and rectum are fluid-filled but otherwise relatively spared.  No drainable fluid collection/abscess.  No free air.  Atherosclerotic calcifications of the abdominal aorta and branch vessels.  No abdominopelvic ascites.  No suspicious abdominopelvic lymphadenopathy.  Prostate and bladder are obscured by streak artifact.  Bilateral total hip arthroplasties.  Degenerative changes of the visualized thoracolumbar spine.  IMPRESSION: Wall thickening involving the majority of the colon and possibly the terminal  ileum, worrisome for infectious/inflammatory enterocolitis.  Rectum and sigmoid colon are relatively spared.  No drainable fluid collection/abscess.  No free air.   Original Report Authenticated By: Charline Bills, M.D.   Dg Chest [facility]  08/31/2012   *RADIOLOGY REPORT*  Clinical Data: Status post central line placement  PORTABLE CHEST - 1 VIEW  Comparison: 08/30/2012  Findings: A new right jugular central line is noted with catheter tip in the mid superior vena cava.  No pneumothorax is seen.  The lungs are well-aerated without focal infiltrate.  No bony abnormality is noted.  IMPRESSION: No evidence of pneumothorax following central line placement.   Original Report Authenticated By: Alcide Clever, M.D.    Microbiology: Recent Results (from the past 240 hour(s))  CULTURE, BLOOD (ROUTINE X 2)     Status: None   Collection Time    08/30/12  4:39 PM      Result Value Range Status   Specimen Description BLOOD LEFT ARM   Final   Special Requests BOTTLES DRAWN AEROBIC AND ANAEROBIC [facility]   Final   Culture  Setup Time     Final   Value: 08/30/2012 22:18     Performed at [facility]   Culture     Final   Value: NO GROWTH 5 DAYS     Performed at [facility]   Report Status 09/05/2012 FINAL   Final  CULTURE, BLOOD (ROUTINE X 2)     Status: None   Collection Time    08/30/12  4:44 PM      Result Value Range Status   Specimen Description BLOOD RIGHT ARM   Final   Special Requests BOTTLES DRAWN AEROBIC AND ANAEROBIC [facility]   Final   Culture  Setup Time     Final   Value: 08/30/2012 22:18     Performed at [facility]   Culture     Final   Value: NO GROWTH 5 DAYS     Performed at [facility]   Report Status 09/05/2012 FINAL   Final  MRSA PCR SCREENING     Status: None   Collection Time    08/30/12  6:35 PM      Result Value Range Status   MRSA by PCR NEGATIVE  NEGATIVE Final   Comment:            The GeneXpert MRSA Assay (FDA     approved for NASAL  specimens     only), is one component of a     comprehensive MRSA colonization     surveillance program. It is not     intended to diagnose MRSA     infection nor to guide or     monitor treatment for     MRSA infections.  URINE CULTURE     Status: None   Collection Time    08/31/12  3:01 AM      Result Value Range Status   Specimen Description URINE, RANDOM   Final   Special Requests NONE   Final   Culture  Setup Time     Final   Value: 08/31/2012 08:36     Performed at [facility]   Colony Count     Final   Value: NO GROWTH     Performed at [facility]   Culture     Final   Value: NO GROWTH     Performed at [facility]   Report Status 09/01/2012 FINAL   Final  CLOSTRIDIUM DIFFICILE BY PCR     Status: None   Collection Time    08/31/12  3:01 AM      Result Value Range Status   C difficile by  pcr NEGATIVE  NEGATIVE Final   Comment: Performed at [facility]  STOOL CULTURE     Status: None   Collection Time    09/03/12 11:28 AM      Result Value Range Status   Specimen Description PERIRECTAL   Final   Special Requests Immunocompromised   Final   Culture     Final   Value: NO SALMONELLA, SHIGELLA, CAMPYLOBACTER, YERSINIA, OR E.COLI 0157:H7 ISOLATED     Performed at [facility]   Report Status 09/07/2012 FINAL   Final     Labs: Basic Metabolic Panel:  Recent Labs Lab 09/01/12 0500  09/02/12 0300  09/03/12 0400  09/04/12 0510  09/04/12 1600 09/04/12 2120 09/05/12 0440 09/05/12 0630 09/06/12 0345 09/07/12 0516  NA 126*  < > 127*  < >  --   < >  --   < > 135 133*  --  132* 133* 131*  K 2.9*  < > 3.1*  < >  --   < >  --   < > 3.4* 3.1*  --  3.1* 3.9 3.9  CL 89*  < > 93*  < >  --   < >  --   < > 102 102  --  101 103 101  CO2 28  < > 27  < >  --   < >  --   < > 18* 24  --  24 23 22   GLUCOSE 143*  < > 205*  < >  --   < >  --   < > 127* 108*  --  98 103* 105*  BUN 5*  < > <3*  < >  --   < >  --   < > 5* 4*  --  4* 4* 5*   CREATININE 0.40*  < > 0.40*  < >  --   < >  --   < > 0.52 0.44*  --  0.42* 0.45* 0.49*  CALCIUM 7.2*  < > 7.2*  < >  --   < >  --   < > 7.7* 7.5*  --  7.8* 8.0* 8.3*  MG 1.6  --  1.3*  --  1.6  --  1.8  --   --   --  1.5  --   --   --   PHOS 1.4*  --  1.1*  --  1.7*  --  2.5  --   --   --  4.2  --   --   --   < > = values in this interval not displayed. Liver Function Tests: No results found for this basename: AST, ALT, ALKPHOS, BILITOT, PROT, ALBUMIN,  in the last 168 hours No results found for this basename: LIPASE, AMYLASE,  in the last 168 hours No results found for this basename: AMMONIA,  in the last 168 hours CBC:  Recent Labs Lab 09/03/12 1100 09/04/12 0510 09/05/12 0630 09/06/12 0345 09/07/12 0516  WBC 10.0 12.4* 8.6 8.0 7.5  NEUTROABS  --  8.2*  --   --   --   HGB 7.2* 7.2* 7.9* 7.8* 8.0*  HCT 19.8* 20.5* 22.5* 22.8* 23.4*  MCV 98.5 101.0* 101.8* 102.7* 102.6*  PLT 62* 94* 166 248 349   Cardiac Enzymes: No results found for this basename: CKTOTAL, CKMB, CKMBINDEX, TROPONINI,  in the last 168 hours BNP: BNP (last 3 results) No results found for this basename: PROBNP,  in the last 8760  hours CBG: No results found for this basename: GLUCAP,  in the last 168 hours  Time coordinating discharge: Over 30 minutes

## 2012-10-10 ENCOUNTER — Inpatient Hospital Stay (HOSPITAL_COMMUNITY)
Admission: EM | Admit: 2012-10-10 | Discharge: 2012-10-22 | DRG: 871 | Disposition: A | Discharge status: Home / Self Care | Payer: No Typology Code available for payment source | Attending: Internal Medicine | Admitting: Internal Medicine

## 2012-10-10 ENCOUNTER — Encounter (HOSPITAL_COMMUNITY): Payer: Self-pay | Admitting: Emergency Medicine

## 2012-10-10 ENCOUNTER — Emergency Department (HOSPITAL_COMMUNITY): Payer: No Typology Code available for payment source

## 2012-10-10 DIAGNOSIS — D473 Essential (hemorrhagic) thrombocythemia: Secondary | ICD-10-CM | POA: Diagnosis present

## 2012-10-10 DIAGNOSIS — F1010 Alcohol abuse, uncomplicated: Secondary | ICD-10-CM | POA: Diagnosis present

## 2012-10-10 DIAGNOSIS — J9600 Acute respiratory failure, unspecified whether with hypoxia or hypercapnia: Secondary | ICD-10-CM | POA: Diagnosis present

## 2012-10-10 DIAGNOSIS — D61818 Other pancytopenia: Secondary | ICD-10-CM

## 2012-10-10 DIAGNOSIS — E876 Hypokalemia: Secondary | ICD-10-CM

## 2012-10-10 DIAGNOSIS — E440 Moderate protein-calorie malnutrition: Secondary | ICD-10-CM | POA: Diagnosis present

## 2012-10-10 DIAGNOSIS — J90 Pleural effusion, not elsewhere classified: Secondary | ICD-10-CM | POA: Diagnosis present

## 2012-10-10 DIAGNOSIS — K709 Alcoholic liver disease, unspecified: Secondary | ICD-10-CM | POA: Diagnosis present

## 2012-10-10 DIAGNOSIS — D65 Disseminated intravascular coagulation [defibrination syndrome]: Secondary | ICD-10-CM

## 2012-10-10 DIAGNOSIS — D709 Neutropenia, unspecified: Secondary | ICD-10-CM

## 2012-10-10 DIAGNOSIS — Z720 Tobacco use: Secondary | ICD-10-CM

## 2012-10-10 DIAGNOSIS — F10239 Alcohol dependence with withdrawal, unspecified: Secondary | ICD-10-CM | POA: Diagnosis present

## 2012-10-10 DIAGNOSIS — D696 Thrombocytopenia, unspecified: Secondary | ICD-10-CM

## 2012-10-10 DIAGNOSIS — A419 Sepsis, unspecified organism: Secondary | ICD-10-CM | POA: Diagnosis present

## 2012-10-10 DIAGNOSIS — A414 Sepsis due to anaerobes: Principal | ICD-10-CM | POA: Diagnosis present

## 2012-10-10 DIAGNOSIS — Z96649 Presence of unspecified artificial hip joint: Secondary | ICD-10-CM

## 2012-10-10 DIAGNOSIS — R042 Hemoptysis: Secondary | ICD-10-CM | POA: Diagnosis not present

## 2012-10-10 DIAGNOSIS — Z113 Encounter for screening for infections with a predominantly sexual mode of transmission: Secondary | ICD-10-CM

## 2012-10-10 DIAGNOSIS — E871 Hypo-osmolality and hyponatremia: Secondary | ICD-10-CM | POA: Diagnosis present

## 2012-10-10 DIAGNOSIS — G9340 Encephalopathy, unspecified: Secondary | ICD-10-CM

## 2012-10-10 DIAGNOSIS — D638 Anemia in other chronic diseases classified elsewhere: Secondary | ICD-10-CM | POA: Diagnosis present

## 2012-10-10 DIAGNOSIS — I10 Essential (primary) hypertension: Secondary | ICD-10-CM | POA: Diagnosis present

## 2012-10-10 DIAGNOSIS — R0902 Hypoxemia: Secondary | ICD-10-CM | POA: Diagnosis present

## 2012-10-10 DIAGNOSIS — F10929 Alcohol use, unspecified with intoxication, unspecified: Secondary | ICD-10-CM

## 2012-10-10 DIAGNOSIS — F10939 Alcohol use, unspecified with withdrawal, unspecified: Secondary | ICD-10-CM | POA: Diagnosis present

## 2012-10-10 DIAGNOSIS — R945 Abnormal results of liver function studies: Secondary | ICD-10-CM

## 2012-10-10 DIAGNOSIS — R569 Unspecified convulsions: Secondary | ICD-10-CM

## 2012-10-10 DIAGNOSIS — R7989 Other specified abnormal findings of blood chemistry: Secondary | ICD-10-CM | POA: Diagnosis present

## 2012-10-10 DIAGNOSIS — J189 Pneumonia, unspecified organism: Secondary | ICD-10-CM | POA: Diagnosis present

## 2012-10-10 DIAGNOSIS — A0472 Enterocolitis due to Clostridium difficile, not specified as recurrent: Secondary | ICD-10-CM

## 2012-10-10 DIAGNOSIS — R7881 Bacteremia: Secondary | ICD-10-CM | POA: Diagnosis present

## 2012-10-10 DIAGNOSIS — I498 Other specified cardiac arrhythmias: Secondary | ICD-10-CM | POA: Diagnosis present

## 2012-10-10 DIAGNOSIS — R748 Abnormal levels of other serum enzymes: Secondary | ICD-10-CM

## 2012-10-10 DIAGNOSIS — F17200 Nicotine dependence, unspecified, uncomplicated: Secondary | ICD-10-CM | POA: Diagnosis present

## 2012-10-10 DIAGNOSIS — F10229 Alcohol dependence with intoxication, unspecified: Secondary | ICD-10-CM | POA: Diagnosis present

## 2012-10-10 LAB — URINALYSIS, ROUTINE W REFLEX MICROSCOPIC
Glucose, UA: NEGATIVE mg/dL
Hgb urine dipstick: NEGATIVE
Ketones, ur: 15 mg/dL — AB
Leukocytes, UA: NEGATIVE
Nitrite: NEGATIVE
Protein, ur: 30 mg/dL — AB
Specific Gravity, Urine: 1.028 (ref 1.005–1.030)
Urobilinogen, UA: 1 mg/dL (ref 0.0–1.0)
pH: 6 (ref 5.0–8.0)

## 2012-10-10 LAB — BLOOD GAS, ARTERIAL
Bicarbonate: 19.5 mEq/L — ABNORMAL LOW (ref 20.0–24.0)
Drawn by: 317871
O2 Content: 2 L/min
Patient temperature: 98.6
pCO2 arterial: 30.3 mmHg — ABNORMAL LOW (ref 35.0–45.0)
pH, Arterial: 7.425 (ref 7.350–7.450)
pO2, Arterial: 94.8 mmHg (ref 80.0–100.0)

## 2012-10-10 LAB — BASIC METABOLIC PANEL
BUN: 9 mg/dL (ref 6–23)
CO2: 21 mEq/L (ref 19–32)
Calcium: 9.6 mg/dL (ref 8.4–10.5)
Chloride: 92 mEq/L — ABNORMAL LOW (ref 96–112)
Creatinine, Ser: 0.64 mg/dL (ref 0.50–1.35)
GFR calc Af Amer: >90 mL/min (ref >90)
GFR calc non Af Amer: >90 mL/min (ref >90)
Glucose, Bld: 131 mg/dL — ABNORMAL HIGH (ref 70–99)
Potassium: 4.2 mEq/L (ref 3.5–5.1)
Sodium: 128 mEq/L — ABNORMAL LOW (ref 135–145)

## 2012-10-10 LAB — URINE MICROSCOPIC-ADD ON

## 2012-10-10 LAB — CG4 I-STAT (LACTIC ACID): Lactic Acid, Venous: 2.26 mmol/L — ABNORMAL HIGH (ref 0.5–2.2)

## 2012-10-10 LAB — CBC WITH DIFFERENTIAL/PLATELET
Basophils Absolute: 0 10*3/uL (ref 0.0–0.1)
Basophils Relative: 0 % (ref 0–1)
Eosinophils Absolute: 0 10*3/uL (ref 0.0–0.7)
Eosinophils Relative: 0 % (ref 0–5)
HCT: 43.4 % (ref 39.0–52.0)
Hemoglobin: 14.4 g/dL (ref 13.0–17.0)
Lymphocytes Relative: 6 % — ABNORMAL LOW (ref 12–46)
Lymphs Abs: 1.1 10*3/uL (ref 0.7–4.0)
MCH: 35.6 pg — ABNORMAL HIGH (ref 26.0–34.0)
MCHC: 33.2 g/dL (ref 30.0–36.0)
MCV: 107.2 fL — ABNORMAL HIGH (ref 78.0–100.0)
Monocytes Absolute: 1.8 10*3/uL — ABNORMAL HIGH (ref 0.1–1.0)
Monocytes Relative: 9 % (ref 3–12)
Neutro Abs: 16.2 10*3/uL — ABNORMAL HIGH (ref 1.7–7.7)
Neutrophils Relative %: 85 % — ABNORMAL HIGH (ref 43–77)
Platelets: 291 10*3/uL (ref 150–400)
RBC: 4.05 MIL/uL — ABNORMAL LOW (ref 4.22–5.81)
RDW: 16.6 % — ABNORMAL HIGH (ref 11.5–15.5)
WBC: 19.1 10*3/uL — ABNORMAL HIGH (ref 4.0–10.5)

## 2012-10-10 LAB — PROTIME-INR: INR: 1.48 (ref 0.00–1.49)

## 2012-10-10 LAB — LACTIC ACID, PLASMA: Lactic Acid, Venous: 0.6 mmol/L (ref 0.5–2.2)

## 2012-10-10 LAB — PHOSPHORUS: Phosphorus: 3.4 mg/dL (ref 2.3–4.6)

## 2012-10-10 LAB — ETHANOL: Alcohol, Ethyl (B): <11 mg/dL (ref 0–11)

## 2012-10-10 LAB — MRSA PCR SCREENING: MRSA by PCR: NEGATIVE

## 2012-10-10 LAB — MAGNESIUM: Magnesium: 1.5 mg/dL (ref 1.5–2.5)

## 2012-10-10 LAB — POCT I-STAT TROPONIN I: Troponin i, poc: 0.02 ng/mL (ref 0.00–0.08)

## 2012-10-10 MED ORDER — ACETAMINOPHEN 500 MG PO TABS
1000.0000 mg | ORAL_TABLET | Freq: Once | ORAL | Status: AC
  Administered 2012-10-10: 1000 mg via ORAL
  Filled 2012-10-10: qty 2

## 2012-10-10 MED ORDER — FAMOTIDINE 20 MG PO TABS
20.0000 mg | ORAL_TABLET | Freq: Every day | ORAL | Status: DC
  Administered 2012-10-11 – 2012-10-21 (×11): 20 mg via ORAL
  Filled 2012-10-10 (×13): qty 1

## 2012-10-10 MED ORDER — ADULT MULTIVITAMIN W/MINERALS CH
1.0000 | ORAL_TABLET | Freq: Every day | ORAL | Status: DC
  Administered 2012-10-11 – 2012-10-22 (×12): 1 via ORAL
  Filled 2012-10-10 (×12): qty 1

## 2012-10-10 MED ORDER — ALBUTEROL SULFATE (5 MG/ML) 0.5% IN NEBU
2.5000 mg | INHALATION_SOLUTION | RESPIRATORY_TRACT | Status: DC | PRN
  Administered 2012-10-13: 2.5 mg via RESPIRATORY_TRACT
  Filled 2012-10-10: qty 0.5

## 2012-10-10 MED ORDER — HYDROMORPHONE HCL PF 1 MG/ML IJ SOLN
1.0000 mg | INTRAMUSCULAR | Status: DC | PRN
  Administered 2012-10-11 – 2012-10-12 (×2): 1 mg via INTRAVENOUS
  Filled 2012-10-10 (×2): qty 1

## 2012-10-10 MED ORDER — VANCOMYCIN HCL IN DEXTROSE 1-5 GM/200ML-% IV SOLN
1000.0000 mg | Freq: Once | INTRAVENOUS | Status: AC
  Administered 2012-10-10: 1000 mg via INTRAVENOUS
  Filled 2012-10-10: qty 200

## 2012-10-10 MED ORDER — PIPERACILLIN-TAZOBACTAM 3.375 G IVPB
3.3750 g | Freq: Once | INTRAVENOUS | Status: AC
  Administered 2012-10-10: 3.375 g via INTRAVENOUS
  Filled 2012-10-10: qty 50

## 2012-10-10 MED ORDER — SODIUM CHLORIDE 0.9 % IV SOLN
Freq: Once | INTRAVENOUS | Status: AC
  Administered 2012-10-10: 19:00:00 via INTRAVENOUS

## 2012-10-10 MED ORDER — VANCOMYCIN HCL IN DEXTROSE 1-5 GM/200ML-% IV SOLN
1000.0000 mg | Freq: Three times a day (TID) | INTRAVENOUS | Status: DC
Start: 2012-10-11 — End: 2012-10-12
  Administered 2012-10-11 – 2012-10-12 (×4): 1000 mg via INTRAVENOUS
  Filled 2012-10-10 (×5): qty 200

## 2012-10-10 MED ORDER — ONDANSETRON HCL 4 MG/2ML IJ SOLN: 4.0000 mg | Freq: Four times a day (QID) | INTRAMUSCULAR | Status: DC | PRN

## 2012-10-10 MED ORDER — VITAMIN B-1 100 MG PO TABS
500.0000 mg | ORAL_TABLET | Freq: Every day | ORAL | Status: DC
  Administered 2012-10-11 – 2012-10-22 (×12): 500 mg via ORAL
  Filled 2012-10-10 (×13): qty 5

## 2012-10-10 MED ORDER — ENOXAPARIN SODIUM 40 MG/0.4ML ~~LOC~~ SOLN
40.0000 mg | SUBCUTANEOUS | Status: DC
  Administered 2012-10-10 – 2012-10-12 (×3): 40 mg via SUBCUTANEOUS
  Filled 2012-10-10 (×4): qty 0.4

## 2012-10-10 MED ORDER — SODIUM CHLORIDE 0.9 % IV BOLUS (SEPSIS)
1000.0000 mL | Freq: Once | INTRAVENOUS | Status: AC
  Administered 2012-10-10: 1000 mL via INTRAVENOUS

## 2012-10-10 MED ORDER — OXYCODONE-ACETAMINOPHEN 5-325 MG PO TABS
1.0000 | ORAL_TABLET | ORAL | Status: DC | PRN
  Administered 2012-10-10 – 2012-10-11 (×3): 2 via ORAL
  Administered 2012-10-12 – 2012-10-21 (×14): 1 via ORAL
  Filled 2012-10-10: qty 1
  Filled 2012-10-10: qty 2
  Filled 2012-10-10 (×2): qty 1
  Filled 2012-10-10: qty 2
  Filled 2012-10-10 (×3): qty 1
  Filled 2012-10-10: qty 2
  Filled 2012-10-10 (×8): qty 1

## 2012-10-10 MED ORDER — SODIUM CHLORIDE 0.9 % IV SOLN
INTRAVENOUS | Status: DC
  Administered 2012-10-10: 50 mL/h via INTRAVENOUS
  Administered 2012-10-12: 20:00:00 via INTRAVENOUS
  Administered 2012-10-13: 1000 mL via INTRAVENOUS
  Administered 2012-10-14 – 2012-10-15 (×2): via INTRAVENOUS
  Administered 2012-10-16: 09:00:00 50 mL/h via INTRAVENOUS
  Administered 2012-10-17: 07:00:00 via INTRAVENOUS
  Administered 2012-10-17: 1000 mL via INTRAVENOUS

## 2012-10-10 MED ORDER — DEXTROSE 5 % IV SOLN
1.0000 g | Freq: Three times a day (TID) | INTRAVENOUS | Status: DC
  Administered 2012-10-10 – 2012-10-13 (×8): 1 g via INTRAVENOUS
  Filled 2012-10-10 (×9): qty 1

## 2012-10-10 MED ORDER — VANCOMYCIN HCL 10 G IV SOLR
1500.0000 mg | Freq: Once | INTRAVENOUS | Status: AC
  Administered 2012-10-10: 1500 mg via INTRAVENOUS
  Filled 2012-10-10: qty 1500

## 2012-10-10 NOTE — ED Notes (Signed)
Pt attempting to void again 

## 2012-10-10 NOTE — ED Notes (Signed)
Lawyer, PA notified of current heart rate.

## 2012-10-10 NOTE — Progress Notes (Signed)
ANTIBIOTIC CONSULT NOTE - INITIAL  Pharmacy Consult for vancomycin, renal antibiotic adjustment Indication: rule out pneumonia  No Known Allergies  Patient Measurements: Height: 5' 6.93" (170 cm) Weight: 140 lb 14 oz (63.9 kg) IBW/kg (Calculated) : 65.94   Vital Signs: Temp: 104 F (40 C) (10/08 1921) Temp src: Rectal (10/08 1921) BP: 138/90 mmHg (10/08 2033) Pulse Rate: 130 (10/08 2033)  Labs:  Recent Labs  10/10/12 1646  WBC 19.1*  HGB 14.4  PLT 291  CREATININE 0.64   Estimated Creatinine Clearance: 106.5 ml/min (by C-G formula based on Cr of 0.64).   Medical History: Past Medical History  Diagnosis Date  . Hypertension   . Broken femur     right  . PONV (postoperative nausea and vomiting)     Microbiology: 10/8 blood x2: collected   Assessment: 18 yoM admitted 10/8 with progressively worsening SOB, now associated with fevers, chills, nausea, and poor PO intake. Pt was recently admitted 8/28-9/5 for acute alcohol intoxication and seizures. Pharmacy has been consulted to dose vancomycin for suspected pneumonia due to hypoxia and need for admission to ICU-SD.  Noted the patient was also started on cefepime 1g IV q8h  Tm 104  WBC elevated at 19.1K, 85% neutrophils  Lactic acid 2.26  Cultures as above  SCr=0.64  Goal of Therapy:  Vancomycin trough level 15-20 mcg/ml eradication of infection  Plan:  - vancomycin 1500mg  IV x 1 as a loading dose - vancomycin 1g IV q8h starting at 0600 on 10/9 based on nomogram - continue cefepime 1g IV q8h - vancomycin trough at steady state if indicated - follow-up clinical course, culture results, renal function - follow-up antibiotic de-escalation and length of therapy  Thank you for the consult.  Tomi Bamberger, PharmD Clinical Pharmacist Pager: [phone] Pharmacy: [phone] 10/10/2012 9:17 PM

## 2012-10-10 NOTE — ED Notes (Signed)
Pt attempted to obtain UA unsuccessful given urinal made aware to call when urine obtained

## 2012-10-10 NOTE — H&P (Signed)
[facility] History and Physical  MACALLAN ORD RUE:[id number] DOB: 1970/05/12 DOA: 10/10/2012  Referring physician: ED physician PCP: Nonnie Done., MD   Chief Complaint: shortness of breath   HPI:  Pt is 42 yo male with history of alcohol abuse and seizures, HTN who presents to [facility] ED with main concern of progressively worsening shortness of breath, initially present with exertion and now at rest, associated with subjective fevers, chills, nausea and poor oral intake, productive cough of clear to yellow sputum. He explains he was just recently discharged from the hospital end of August and was doing well until several days prior to this admission when his symptoms started. He denies chest pain other than the one present with coughing spells. He denies other specific abdominal or urinary concerns, no specific focal neurological symptoms.   In ED, pt found to be hypoxic with oxygen saturation in 80's on RA, CXR consistent with PNA and [facility] asked to admit to SDU for further management.   Assessment and Plan:  Principal Problem:   Acute respiratory failure - secondary to PNA, will treat as HCAP given recent hospitalization - will admit to SDU as pt still somewhat uncomfortable with tachypnea of 30 bpm - no use of accessory muscles - will repeat lactic acid and will check ABG - will continue oxygen as now oxygen saturation is in 90's on 2 L - will obtain sputum analysis and will treat with broad spectrum ABX Vancomycin and Maxipime  Active Problems:   HCAP (healthcare-associated pneumonia), SIRS - given fever, leukocytosis, tachycardia, elevated lactic acid, with evidence of PNA - broad spectrum ABX as noted above - supportive care with nebulizer and oxygen - sputum analysis ordered, urine legionella and strep penumo    Alcohol abuse - will check alcohol level - place on CIWA   Hyponatremia - secondary to pre renal etiology - provide IVF and repeat BMP in AM   Anemia of  chronic disease - Hg is actually slightly higher than baseline but likely dehydration and hemoconcentration - pt has macrocytic anemia and is on vit B12 daily which will continue - CBC in AM   Moderate protein-calorie malnutrition - secondary to acute on chronic illness, alcohol abuse - may need nutrition consultation once appetite improves   Code Status: Full Family Communication: Pt at bedside Disposition Plan: Admit to stepdown unit   Review of Systems:  Constitutional: Negative for diaphoresis.  HENT: Negative for hearing loss, ear pain, nosebleeds, congestion, sore throat, neck pain, tinnitus and ear discharge.   Eyes: Negative for blurred vision, double vision, photophobia, pain, discharge and redness.  Respiratory: Negative for wheezing and stridor.   Cardiovascular: Negative for palpitations, orthopnea, claudication and leg swelling.  Gastrointestinal: Negative for heartburn, constipation, blood in stool and melena.  Genitourinary: Negative for dysuria, urgency, frequency, hematuria and flank pain.  Musculoskeletal: Negative for myalgias, joint pain and falls.  Skin: Negative for itching and rash.  Neurological: Negative for tingling, tremors, sensory change, speech change, focal weakness, loss of consciousness and headaches.  Endo/Heme/Allergies: Negative for environmental allergies and polydipsia. Does not bruise/bleed easily.  Psychiatric/Behavioral: Negative for suicidal ideas. The patient is not nervous/anxious.      Past Medical History  Diagnosis Date  . Hypertension   . Broken femur     right  . PONV (postoperative nausea and vomiting)     Past Surgical History  Procedure Laterality Date  . Wrist surgery    . Joint replacement      bilateral hip  replacement    Social History:  reports that he has been smoking Cigarettes.  He has a 13 pack-year smoking history. He has never used smokeless tobacco. He reports that he drinks alcohol. He reports that he does not  use illicit drugs.  No Known Allergies  No family history of cancers   Prior to Admission medications   Medication Sig Start Date End Date Taking? Authorizing Provider  amLODipine (NORVASC) 10 MG tablet Take 1 tablet (10 mg total) by mouth daily. 09/07/12  Yes Alison Murray, MD  cyclobenzaprine (FLEXERIL) 10 MG tablet Take 1 tablet (10 mg total) by mouth 2 (two) times daily as needed for muscle spasms. 09/07/12  Yes Alison Murray, MD  famotidine (PEPCID) 20 MG tablet Take 1 tablet (20 mg total) by mouth at bedtime. 09/07/12  Yes Alison Murray, MD  Multiple Vitamin (MULTIVITAMIN WITH MINERALS) TABS tablet Take 1 tablet by mouth daily. 09/07/12  Yes Alison Murray, MD  thiamine 500 MG tablet Take 1 tablet (500 mg total) by mouth daily. 09/07/12  Yes Alison Murray, MD    Physical Exam: Filed Vitals:   10/10/12 1549 10/10/12 1712 10/10/12 1907 10/10/12 1921  BP: 129/78 139/86 135/87   Pulse: 146 139 135   Temp: 99.9 F (37.7 C)   104 F (40 C)  TempSrc: Oral   Rectal  Resp: 20 14 18    SpO2: 95% 100% 97%     Physical Exam  Constitutional: Appears well-developed and well-nourished. Mild distress due to shortness of breath  HENT: Normocephalic. External right and left ear normal. Dry MM Eyes: Conjunctivae and EOM are normal. PERRLA, no scleral icterus.  Neck: Normal ROM. Neck supple. No JVD. No tracheal deviation. No thyromegaly.  CVS: Regular rhythm, tachycardic, S1/S2 +, no murmurs, no gallops, no carotid bruit.  Pulmonary: Rhonchi at left lower lung area, diminished breath sounds at bases, clear sounds on the right side   Abdominal: Soft. BS +,  no distension, tenderness, rebound or guarding.  Musculoskeletal: Normal range of motion. No edema and no tenderness.  Lymphadenopathy: No lymphadenopathy noted, cervical, inguinal. Neuro: Alert. Normal reflexes, muscle tone coordination. No cranial nerve deficit. Skin: Skin is warm and dry. No rash noted. Not diaphoretic. No erythema. No pallor.   Psychiatric: Normal mood and affect. Behavior, judgment, thought content normal.   Labs on Admission:  Basic Metabolic Panel:  Recent Labs Lab 10/10/12 1646  NA 128*  K 4.2  CL 92*  CO2 21  GLUCOSE 131*  BUN 9  CREATININE 0.64  CALCIUM 9.6   CBC:  Recent Labs Lab 10/10/12 1646  WBC 19.1*  NEUTROABS 16.2*  HGB 14.4  HCT 43.4  MCV 107.2*  PLT 291   Radiological Exams on Admission: Dg Chest 2 View  10/10/2012   CLINICAL DATA:  Chest pain. Hypertension.  EXAM: CHEST  2 VIEW  COMPARISON:  08/31/2012  FINDINGS: There is dense consolidation in the lingula and left lower lobe. Right basilar subsegmental atelectasis. Normal heart size. No pneumothorax.  IMPRESSION: There is consolidation in the lingula and left lower lobe compatible with pneumonia.   Electronically Signed   By: Maryclare Bean M.D.   On: 10/10/2012 17:03    EKG: Normal sinus rhythm, no ST/T wave changes  Debbora Presto, MD  [facility] Pager [phone]  If 7PM-7AM, please contact night-coverage [email or web address] Password [email or web address] 10/10/2012, 7:23 PM

## 2012-10-10 NOTE — ED Notes (Signed)
Pt c/o L side chest pain, nausea and back pain x 2 days.  Pain score 9/10.  Pt sts pain increases with deep breathing.

## 2012-10-11 LAB — CBC
HCT: 40 % (ref 39.0–52.0)
MCH: 35.4 pg — ABNORMAL HIGH (ref 26.0–34.0)
MCHC: 32.5 g/dL (ref 30.0–36.0)
MCV: 109 fL — ABNORMAL HIGH (ref 78.0–100.0)
Platelets: 292 10*3/uL (ref 150–400)
RBC: 3.67 MIL/uL — ABNORMAL LOW (ref 4.22–5.81)
RDW: 16.5 % — ABNORMAL HIGH (ref 11.5–15.5)
WBC: 18.3 10*3/uL — ABNORMAL HIGH (ref 4.0–10.5)

## 2012-10-11 LAB — STREP PNEUMONIAE URINARY ANTIGEN: Strep Pneumo Urinary Antigen: POSITIVE — AB

## 2012-10-11 LAB — BASIC METABOLIC PANEL
Calcium: 8.9 mg/dL (ref 8.4–10.5)
Chloride: 99 mEq/L (ref 96–112)
Creatinine, Ser: 0.52 mg/dL (ref 0.50–1.35)
GFR calc non Af Amer: >90 mL/min (ref >90)
Glucose, Bld: 100 mg/dL — ABNORMAL HIGH (ref 70–99)
Sodium: 133 mEq/L — ABNORMAL LOW (ref 135–145)

## 2012-10-11 LAB — LEGIONELLA ANTIGEN, URINE

## 2012-10-11 LAB — HIV ANTIBODY (ROUTINE TESTING W REFLEX): HIV: NONREACTIVE

## 2012-10-11 LAB — RAPID URINE DRUG SCREEN, HOSP PERFORMED
Amphetamines: NOT DETECTED
Barbiturates: NOT DETECTED
Benzodiazepines: NOT DETECTED

## 2012-10-11 MED ORDER — MAGNESIUM SULFATE 40 MG/ML IJ SOLN
2.0000 g | Freq: Once | INTRAMUSCULAR | Status: AC
  Administered 2012-10-11: 2 g via INTRAVENOUS
  Filled 2012-10-11: qty 50

## 2012-10-11 MED ORDER — ACETAMINOPHEN 325 MG PO TABS
650.0000 mg | ORAL_TABLET | Freq: Four times a day (QID) | ORAL | Status: DC | PRN
  Administered 2012-10-11 – 2012-10-15 (×4): 650 mg via ORAL
  Filled 2012-10-11 (×4): qty 2

## 2012-10-11 MED ORDER — FOLIC ACID 1 MG PO TABS
1.0000 mg | ORAL_TABLET | Freq: Every day | ORAL | Status: DC
  Administered 2012-10-11 – 2012-10-22 (×12): 1 mg via ORAL
  Filled 2012-10-11 (×12): qty 1

## 2012-10-11 MED ORDER — LORAZEPAM 2 MG/ML IJ SOLN
1.0000 mg | Freq: Four times a day (QID) | INTRAMUSCULAR | Status: AC | PRN
  Administered 2012-10-12 – 2012-10-13 (×3): 1 mg via INTRAVENOUS
  Filled 2012-10-11 (×4): qty 1

## 2012-10-11 MED ORDER — LORAZEPAM 1 MG PO TABS: 1.0000 mg | ORAL_TABLET | Freq: Four times a day (QID) | ORAL | Status: AC | PRN

## 2012-10-11 MED ORDER — PNEUMOCOCCAL VAC POLYVALENT 25 MCG/0.5ML IJ INJ
0.5000 mL | INJECTION | INTRAMUSCULAR | Status: AC
  Filled 2012-10-11 (×2): qty 0.5

## 2012-10-11 MED ORDER — LORAZEPAM 2 MG/ML IJ SOLN
0.0000 mg | Freq: Four times a day (QID) | INTRAMUSCULAR | Status: AC
  Administered 2012-10-11: 1 mg via INTRAVENOUS
  Administered 2012-10-12: 2 mg via INTRAVENOUS
  Administered 2012-10-12: 1 mg via INTRAVENOUS
  Administered 2012-10-13: 2 mg via INTRAVENOUS
  Filled 2012-10-11 (×3): qty 1

## 2012-10-11 MED ORDER — SODIUM CHLORIDE 0.9 % IV BOLUS (SEPSIS)
1000.0000 mL | Freq: Once | INTRAVENOUS | Status: AC
  Administered 2012-10-11: 1000 mL via INTRAVENOUS

## 2012-10-11 MED ORDER — LORAZEPAM 2 MG/ML IJ SOLN
0.0000 mg | Freq: Two times a day (BID) | INTRAMUSCULAR | Status: AC
  Administered 2012-10-13: 2 mg via INTRAVENOUS
  Administered 2012-10-13 – 2012-10-14 (×3): 1 mg via INTRAVENOUS
  Filled 2012-10-11 (×4): qty 1

## 2012-10-11 MED ORDER — INFLUENZA VAC SPLIT QUAD 0.5 ML IM SUSP
0.5000 mL | INTRAMUSCULAR | Status: AC
  Filled 2012-10-11 (×2): qty 0.5

## 2012-10-11 NOTE — Progress Notes (Signed)
[id number] Earlene Plater, RN, BSN, CCM [phone] Chart Reviewed for discharge and hospital needs. Discharge needs at time of review:  None Review of patient progress due on [id number].

## 2012-10-11 NOTE — Progress Notes (Signed)
[facility] PROGRESS NOTE  Eugene Jackson:[id number] DOB: 1970/01/20 DOA: 10/10/2012 PCP: Nonnie Done., MD  HPI: Pt is 42 yo male with history of alcohol abuse and seizures, HTN who presents to [facility] ED with main concern of progressively worsening shortness of breath, initially present with exertion and now at rest, associated with subjective fevers, chills, nausea and poor oral intake, productive cough of clear to yellow sputum. He explains he was just recently discharged from the hospital end of August and was doing well until several days prior to this admission when his symptoms started. He denies chest pain other than the one present with coughing spells. He denies other specific abdominal or urinary concerns, no specific focal neurological symptoms.  In ED, pt found to be hypoxic with oxygen saturation in 80's on RA, CXR consistent with PNA and TR[facility] asked to admit to SDU for further management.   Assessment/Plan: Acute hypoxic respiratory failure - secondary to PNA, will treat as HCAP given recent hospitalization. Patient's breathing is stable this morning. Continue monitoring in SDU. Continue Vanc/Cefepime HCAP (healthcare-associated pneumonia), SIRS - given fever, leukocytosis, tachycardia, elevated lactic acid, with evidence of PNA. Broad spectrum ABX as noted above  Alcohol abuse - started on CIWA today. Quite tremulous this morning.  Hyponatremia - secondary to pre renal etiology, improving.   Anemia of chronic disease - stable Moderate protein-calorie malnutrition  - secondary to acute on chronic illness, alcohol abuse  - may need nutrition consultation once appetite improves  Sinus tachycardia - due to active infection, ETOH withdrawal, fever. Closely monitor.  DVT Prophylaxis - lovenox  Code Status: Full Family Communication: Pt in the room  Disposition Plan: SDU  Consultants:  none  Procedures:  none  Anti-infectives   Start     Dose/Rate Route Frequency  Ordered Stop   10/11/12 0600  vancomycin (VANCOCIN) IVPB 1000 mg/200 mL premix     1,000 mg 200 mL/hr over 60 Minutes Intravenous Every 8 hours 10/10/12 2109     10/10/12 2200  vancomycin (VANCOCIN) 1,500 mg in sodium chloride 0.9 % 500 mL IVPB     1,500 mg 250 mL/hr over 120 Minutes Intravenous  Once 10/10/12 2108 10/11/12 0100   10/10/12 2130  ceFEPIme (MAXIPIME) 1 g in dextrose 5 % 50 mL IVPB     1 g 100 mL/hr over 30 Minutes Intravenous 3 times per day 10/10/12 2056 10/18/12 2159   10/10/12 1730  vancomycin (VANCOCIN) IVPB 1000 mg/200 mL premix     1,000 mg 200 mL/hr over 60 Minutes Intravenous  Once 10/10/12 1729 10/10/12 2005   10/10/12 1730  piperacillin-tazobactam (ZOSYN) IVPB 3.375 g     3.375 g 12.5 mL/hr over 240 Minutes Intravenous  Once 10/10/12 1729 10/10/12 1904     Antibiotics Given (last 72 hours)   Date/Time Action Medication Dose Rate   10/10/12 2141 Given   ceFEPIme (MAXIPIME) 1 g in dextrose 5 % 50 mL IVPB 1 g 100 mL/hr   10/10/12 2300 Given   vancomycin (VANCOCIN) 1,500 mg in sodium chloride 0.9 % 500 mL IVPB 1,500 mg 250 mL/hr   10/11/12 0522 Given   ceFEPIme (MAXIPIME) 1 g in dextrose 5 % 50 mL IVPB 1 g 100 mL/hr   10/11/12 8119 Given   vancomycin (VANCOCIN) IVPB 1000 mg/200 mL premix 1,000 mg 200 mL/hr      HPI/Subjective: - feeling better, breathing better  Objective: Filed Vitals:   10/11/12 0500 10/11/12 0716 10/11/12 0745 10/11/12 0800  BP:  157/81 144/80  Pulse: 125 133 140 135  Temp: 100.9 F (38.3 C) 102.1 F (38.9 C) 102.1 F (38.9 C)   TempSrc: Oral Oral Oral   Resp: 27 31 25 28   Height:      Weight:      SpO2: 97% 99% 97% 96%    Intake/Output Summary (Last 24 hours) at 10/11/12 0835 Last data filed at 10/11/12 1610  Gross per 24 hour  Intake   1125 ml  Output    525 ml  Net    600 ml   Filed Weights   10/10/12 2100  Weight: 63.9 kg (140 lb 14 oz)    Exam:   General:  NAD, anxious/tremulous  Cardiovascular: regular  rate and rhythm, without MRG, tachycardic  Respiratory: good air movement, clear to auscultation throughout, no wheezing, ronchi or rales  Abdomen: soft, not tender to palpation, positive bowel sounds  MSK: no peripheral edema  Neuro: CN 2-12 grossly intact, MS 5/5 in all 4  Data Reviewed: Basic Metabolic Panel:  Recent Labs Lab 10/10/12 1646 10/10/12 2157 10/11/12 0332  NA 128*  --  133*  K 4.2  --  3.7  CL 92*  --  99  CO2 21  --  22  GLUCOSE 131*  --  100*  BUN 9  --  10  CREATININE 0.64  --  0.52  CALCIUM 9.6  --  8.9  MG  --  1.5  --   PHOS  --  3.4  --    CBC:  Recent Labs Lab 10/10/12 1646 10/11/12 0332  WBC 19.1* 18.3*  NEUTROABS 16.2*  --   HGB 14.4 13.0  HCT 43.4 40.0  MCV 107.2* 109.0*  PLT 291 292    Recent Results (from the past 240 hour(s))  CULTURE, RESPIRATORY (NON-EXPECTORATED)     Status: None   Collection Time    10/10/12  9:46 PM      Result Value Range Status   Specimen Description SPUTUM   Final   Special Requests NONE   Final   Gram Stain     Final   Value: ABUNDANT WBC PRESENT, PREDOMINANTLY PMN     FEW SQUAMOUS EPITHELIAL CELLS PRESENT     ABUNDANT GRAM POSITIVE RODS     ABUNDANT GRAM POSITIVE COCCI IN PAIRS     FEW GRAM NEGATIVE RODS   Culture PENDING   Incomplete   Report Status PENDING   Incomplete  MRSA PCR SCREENING     Status: None   Collection Time    10/10/12  9:47 PM      Result Value Range Status   MRSA by PCR NEGATIVE  NEGATIVE Final   Comment:            The GeneXpert MRSA Assay (FDA     approved for NASAL specimens     only), is one component of a     comprehensive MRSA colonization     surveillance program. It is not     intended to diagnose MRSA     infection nor to guide or     monitor treatment for     MRSA infections.     Studies: Dg Chest 2 View  10/10/2012   CLINICAL DATA:  Chest pain. Hypertension.  EXAM: CHEST  2 VIEW  COMPARISON:  08/31/2012  FINDINGS: There is dense consolidation in the  lingula and left lower lobe. Right basilar subsegmental atelectasis. Normal heart size. No pneumothorax.  IMPRESSION: There is consolidation  in the lingula and left lower lobe compatible with pneumonia.   Electronically Signed   By: Maryclare Bean M.D.   On: 10/10/2012 17:03    Scheduled Meds: . ceFEPime (MAXIPIME) IV  1 g Intravenous Q8H  . enoxaparin (LOVENOX) injection  40 mg Subcutaneous Q24H  . famotidine  20 mg Oral QHS  . folic acid  1 mg Oral Daily  . LORazepam  0-4 mg Intravenous Q6H   Followed by  . [START ON 10/13/2012] LORazepam  0-4 mg Intravenous Q12H  . multivitamin with minerals  1 tablet Oral Daily  . thiamine  500 mg Oral Daily  . vancomycin  1,000 mg Intravenous Q8H   Continuous Infusions: . sodium chloride 50 mL/hr (10/10/12 2056)    Principal Problem:   Acute respiratory failure Active Problems:   Alcohol abuse   Hyponatremia   Anemia of chronic disease   Moderate protein-calorie malnutrition   HCAP (healthcare-associated pneumonia)  Time spent: 35  Pamella Pert, MD [facility] Pager [phone]. If 7 PM - 7 AM, please contact night-coverage at [email or web address], password [email or web address] 10/11/2012, 8:35 AM  LOS: 1 day

## 2012-10-12 DIAGNOSIS — R7881 Bacteremia: Secondary | ICD-10-CM | POA: Diagnosis present

## 2012-10-12 LAB — CBC
HCT: 31.2 % — ABNORMAL LOW (ref 39.0–52.0)
Hemoglobin: 10.2 g/dL — ABNORMAL LOW (ref 13.0–17.0)
MCHC: 32.7 g/dL (ref 30.0–36.0)
MCV: 105.8 fL — ABNORMAL HIGH (ref 78.0–100.0)
WBC: 12.2 10*3/uL — ABNORMAL HIGH (ref 4.0–10.5)

## 2012-10-12 LAB — COMPREHENSIVE METABOLIC PANEL
ALT: 7 U/L (ref 0–53)
AST: 14 U/L (ref 0–37)
Albumin: 2 g/dL — ABNORMAL LOW (ref 3.5–5.2)
Calcium: 8.1 mg/dL — ABNORMAL LOW (ref 8.4–10.5)
GFR calc Af Amer: >90 mL/min (ref >90)
Glucose, Bld: 95 mg/dL (ref 70–99)
Sodium: 130 mEq/L — ABNORMAL LOW (ref 135–145)
Total Bilirubin: 0.8 mg/dL (ref 0.3–1.2)
Total Protein: 5.6 g/dL — ABNORMAL LOW (ref 6.0–8.3)

## 2012-10-12 LAB — PHOSPHORUS: Phosphorus: 2.3 mg/dL (ref 2.3–4.6)

## 2012-10-12 LAB — VANCOMYCIN, TROUGH: Vancomycin Tr: 9.7 ug/mL — ABNORMAL LOW (ref 10.0–20.0)

## 2012-10-12 MED ORDER — POTASSIUM CHLORIDE CRYS ER 20 MEQ PO TBCR
40.0000 meq | EXTENDED_RELEASE_TABLET | ORAL | Status: AC
  Administered 2012-10-12 (×2): 40 meq via ORAL
  Filled 2012-10-12 (×2): qty 2

## 2012-10-12 MED ORDER — ACETAMINOPHEN 650 MG RE SUPP
650.0000 mg | RECTAL | Status: DC | PRN
  Administered 2012-10-13: 650 mg via RECTAL
  Filled 2012-10-12: qty 1

## 2012-10-12 MED ORDER — VANCOMYCIN HCL 10 G IV SOLR
1250.0000 mg | Freq: Three times a day (TID) | INTRAVENOUS | Status: DC
  Administered 2012-10-12 – 2012-10-13 (×3): 1250 mg via INTRAVENOUS
  Filled 2012-10-12 (×4): qty 1250

## 2012-10-12 NOTE — Progress Notes (Signed)
Rx Brief Antibiotic Note:  IV Vancomycin  Assessement:  VT= 9.7 below goal  SCr stable   Plan:  Increase Vancomycin to 1250mg  IV q8h  F/u SCr/levels  Eugene Jackson 10/12/2012 6:41 AM

## 2012-10-12 NOTE — ED Provider Notes (Signed)
Medical screening examination/treatment/procedure(s) were performed by non-physician practitioner and as supervising physician I was immediately available for consultation/collaboration.  Shanna Cisco, MD 10/12/12 [phone]

## 2012-10-12 NOTE — ED Provider Notes (Signed)
CSN: [id number]     Arrival date & time 10/10/12  1535 History   First MD Initiated Contact with Patient 10/10/12 1604     Chief Complaint  Patient presents with  . Chest Pain   (Consider location/radiation/quality/duration/timing/severity/associated sxs/prior Treatment) HPI  patient presents emergency department with a three-day history of left-sided  chest discomfort that radiated to his back.  Patient, states she's also had shortness of breath, cough.  The patient, states he did not take any medications prior to arrival.  The patient denies nausea, vomiting, diarrhea, headache, blurred vision, weakness, numbness, dizziness, back pain, dysuria, nasal congestion, sore throat or syncope.  The patient, states, that exertion seems to make his shortness of breath, worse  Past Medical History  Diagnosis Date  . Hypertension   . Broken femur     right  . PONV (postoperative nausea and vomiting)    Past Surgical History  Procedure Laterality Date  . Wrist surgery    . Joint replacement      bilateral hip replacement   History reviewed. No pertinent family history. History  Substance Use Topics  . Smoking status: Current Every Day Smoker -- 0.50 packs/day for 26 years    Types: Cigarettes  . Smokeless tobacco: Never Used  . Alcohol Use: Yes     Comment: 1/5 th vodka every 2 days    Review of Systems All other systems negative except as documented in the HPI. All pertinent positives and negatives as reviewed in the HPI. Allergies  Review of patient's allergies indicates no known allergies.  Home Medications  No current outpatient prescriptions on file. BP 135/69  Pulse 125  Temp(Src) 100.2 F (37.9 C) (Oral)  Resp 25  Ht 5' 6.93" (1.7 m)  Wt 140 lb 14 oz (63.9 kg)  BMI 22.11 kg/m2  SpO2 91% Physical Exam  Nursing note and vitals reviewed. Constitutional: He is oriented to person, place, and time. He appears well-developed and well-nourished. No distress.  HENT:  Head:  Normocephalic and atraumatic.  Mouth/Throat: Oropharynx is clear and moist.  Eyes: Pupils are equal, round, and reactive to light.  Neck: Normal range of motion. Neck supple.  Cardiovascular: Regular rhythm.  Tachycardia present.  Exam reveals no gallop and no friction rub.   No murmur heard. Pulmonary/Chest: Effort normal and breath sounds normal. He exhibits no tenderness.  Patient has decreased breath sounds in the left lower lung  Abdominal: Soft. Bowel sounds are normal. He exhibits no distension. There is no tenderness.  Neurological: He is alert and oriented to person, place, and time.  Skin: Skin is warm and dry. No rash noted. No erythema.    ED Course  Procedures (including critical care time) Labs Review Labs Reviewed  CBC WITH DIFFERENTIAL - Abnormal; Notable for the following:    WBC 19.1 (*)    RBC 4.05 (*)    MCV 107.2 (*)    MCH 35.6 (*)    RDW 16.6 (*)    Neutrophils Relative % 85 (*)    Neutro Abs 16.2 (*)    Lymphocytes Relative 6 (*)    Monocytes Absolute 1.8 (*)    All other components within normal limits  BASIC METABOLIC PANEL - Abnormal; Notable for the following:    Sodium 128 (*)    Chloride 92 (*)    Glucose, Bld 131 (*)    All other components within normal limits  URINALYSIS, ROUTINE W REFLEX MICROSCOPIC - Abnormal; Notable for the following:    Color,  Urine AMBER (*)    Bilirubin Urine SMALL (*)    Ketones, ur 15 (*)    Protein, ur 30 (*)    All other components within normal limits  STREP PNEUMONIAE URINARY ANTIGEN - Abnormal; Notable for the following:    Strep Pneumo Urinary Antigen POSITIVE (*)    All other components within normal limits  PROTIME-INR - Abnormal; Notable for the following:    Prothrombin Time 17.5 (*)    All other components within normal limits  BASIC METABOLIC PANEL - Abnormal; Notable for the following:    Sodium 133 (*)    Glucose, Bld 100 (*)    All other components within normal limits  CBC - Abnormal; Notable  for the following:    WBC 18.3 (*)    RBC 3.67 (*)    MCV 109.0 (*)    MCH 35.4 (*)    RDW 16.5 (*)    All other components within normal limits  BLOOD GAS, ARTERIAL - Abnormal; Notable for the following:    pCO2 arterial 30.3 (*)    Bicarbonate 19.5 (*)    Acid-base deficit 3.5 (*)    All other components within normal limits  CG4 I-STAT (LACTIC ACID) - Abnormal; Notable for the following:    Lactic Acid, Venous 2.26 (*)    All other components within normal limits  CULTURE, BLOOD (ROUTINE X 2)  CULTURE, BLOOD (ROUTINE X 2)  MRSA PCR SCREENING  CULTURE, RESPIRATORY (NON-EXPECTORATED)  URINE MICROSCOPIC-ADD ON  HIV ANTIBODY (ROUTINE TESTING)  LEGIONELLA ANTIGEN, URINE  MAGNESIUM  PHOSPHORUS  ETHANOL  URINE RAPID DRUG SCREEN (HOSP PERFORMED)  LACTIC ACID, PLASMA  COMPREHENSIVE METABOLIC PANEL  CBC  MAGNESIUM  PHOSPHORUS  VANCOMYCIN, TROUGH  POCT I-STAT TROPONIN I   Imaging Review Dg Chest 2 View  10/10/2012   CLINICAL DATA:  Chest pain. Hypertension.  EXAM: CHEST  2 VIEW  COMPARISON:  08/31/2012  FINDINGS: There is dense consolidation in the lingula and left lower lobe. Right basilar subsegmental atelectasis. Normal heart size. No pneumothorax.  IMPRESSION: There is consolidation in the lingula and left lower lobe compatible with pneumonia.   Electronically Signed   By: Maryclare Bean M.D.   On: 10/10/2012 17:03    EKG Interpretation     Ventricular Rate:    PR Interval:    QRS Duration:   QT Interval:    QTC Calculation:   R Axis:     Text Interpretation:              MDM   1. Healthcare-associated pneumonia   2. Abnormal liver function tests   3. Acute alcohol intoxication   4. Acute respiratory failure    patient be admitted to the hospital for healthcare associated pneumonia and treated for this such [facility] will be down to admit the patient to the hospital   Carlyle Dolly, PA-C 10/12/12 4742

## 2012-10-12 NOTE — Progress Notes (Signed)
[facility] PROGRESS NOTE  Eugene Jackson ZOX:[id number] DOB: [birth year] DOA: 10/10/2012 PCP: Nonnie Done., MD  Assessment/Plan: Acute hypoxic respiratory failure - secondary to PNA, will treat as HCAP given recent hospitalization. Patient's breathing is stable this morning. Continue monitoring in SDU. Continue Vanc/Cefepime GNR bacteremia - On cefepime; suspect lung source; awaiting final report. HCAP (healthcare-associated pneumonia), SIRS - given fever, leukocytosis, tachycardia, elevated lactic acid, with evidence of PNA. Broad spectrum ABX as noted above  - afebrile this morning, 93% on 2L - WBC improving - clinically better but still short winded with activity. Blood tinged sputum improving.  - strep pneumo Ag positive. Alcohol withdrawal  - continues to show signs of withdrawal, closely monitor. Scheduled Ativan. Hyponatremia - secondary to pre renal etiology, stable today   Anemia of chronic disease - stable Moderate protein-calorie malnutrition  - secondary to acute on chronic illness, alcohol abuse  - may need nutrition consultation once appetite improves  Sinus tachycardia - due to active infection, ETOH withdrawal, fever. Closely monitor.  DVT Prophylaxis - lovenox  Code Status: Full Family Communication: Pt in the room  Disposition Plan: SDU  Consultants:  none  Procedures:  none   Antibiotics Vancomycin 10/8 >> Cefepime 10/8 >>  HPI/Subjective: - feeling better, breathing better  Objective: Filed Vitals:   10/11/12 2000 10/12/12 0000 10/12/12 0245 10/12/12 0400  BP: 118/72 135/69  115/73  Pulse: 130 125 116 114  Temp: 100.7 F (38.2 C) 100.2 F (37.9 C)  99.3 F (37.4 C)  TempSrc: Oral Oral  Oral  Resp: 27 25  21   Height:      Weight:    71.6 kg (157 lb 13.6 oz)  SpO2: 100% 91%  92%    Intake/Output Summary (Last 24 hours) at 10/12/12 0705 Last data filed at 10/12/12 0600  Gross per 24 hour  Intake   1750 ml  Output   1300 ml   Net    450 ml   Filed Weights   10/10/12 2100 10/12/12 0400  Weight: 63.9 kg (140 lb 14 oz) 71.6 kg (157 lb 13.6 oz)    Exam:  General:  NAD, anxious/tremulous  Cardiovascular: regular rate and rhythm, without MRG, tachycardic  Respiratory: good air movement, mild decrease breath sounds on left, rhonchi present  Abdomen: soft, not tender to palpation, positive bowel sounds  MSK: no peripheral edema  Neuro: CN 2-12 grossly intact, MS 5/5 in all 4  Data Reviewed: Basic Metabolic Panel:  Recent Labs Lab 10/10/12 1646 10/10/12 2157 10/11/12 0332 10/12/12 0515  NA 128*  --  133* 130*  K 4.2  --  3.7 3.0*  CL 92*  --  99 97  CO2 21  --  22 24  GLUCOSE 131*  --  100* 95  BUN 9  --  10 8  CREATININE 0.64  --  0.52 0.50  CALCIUM 9.6  --  8.9 8.1*  MG  --  1.5  --  1.9  PHOS  --  3.4  --  2.3   CBC:  Recent Labs Lab 10/10/12 1646 10/11/12 0332 10/12/12 0515  WBC 19.1* 18.3* 12.2*  NEUTROABS 16.2*  --   --   HGB 14.4 13.0 10.2*  HCT 43.4 40.0 31.2*  MCV 107.2* 109.0* 105.8*  PLT 291 292 266    Recent Results (from the past 240 hour(s))  CULTURE, BLOOD (ROUTINE X 2)     Status: None   Collection Time    10/10/12  6:02 PM  Result Value Range Status   Specimen Description BLOOD RIGHT ANTECUBITAL   Final   Special Requests BOTTLES DRAWN AEROBIC AND ANAEROBIC 5CC   Final   Culture  Setup Time     Final   Value: 10/11/2012 02:20     Performed at [facility]   Culture     Final   Value: GRAM NEGATIVE RODS     Note: Gram Stain Report Called to,Read Back By and Verified With: MEGAN STOCKS ON 10/11/2012 AT 10:28P BY WILEJ     Performed at [facility]   Report Status PENDING   Incomplete  CULTURE, BLOOD (ROUTINE X 2)     Status: None   Collection Time    10/10/12  6:02 PM      Result Value Range Status   Specimen Description BLOOD LEFT ANTECUBITAL   Final   Special Requests BOTTLES DRAWN AEROBIC AND ANAEROBIC 5CC   Final   Culture  Setup  Time     Final   Value: 10/11/2012 02:21     Performed at [facility]   Culture     Final   Value: GRAM NEGATIVE RODS     Note: Gram Stain Report Called to,Read Back By and Verified With: MEGAN STOCKS ON 10/11/2012 AT 10:28P BY [employer]     Performed at [facility]   Report Status PENDING   Incomplete  CULTURE, RESPIRATORY (NON-EXPECTORATED)     Status: None   Collection Time    10/10/12  9:46 PM      Result Value Range Status   Specimen Description SPUTUM   Final   Special Requests NONE   Final   Gram Stain     Final   Value: ABUNDANT WBC PRESENT, PREDOMINANTLY PMN     FEW SQUAMOUS EPITHELIAL CELLS PRESENT     ABUNDANT GRAM POSITIVE RODS     ABUNDANT GRAM POSITIVE COCCI IN PAIRS     FEW GRAM NEGATIVE RODS   Culture PENDING   Incomplete   Report Status PENDING   Incomplete  MRSA PCR SCREENING     Status: None   Collection Time    10/10/12  9:47 PM      Result Value Range Status   MRSA by PCR NEGATIVE  NEGATIVE Final   Comment:            The GeneXpert MRSA Assay (FDA     approved for NASAL specimens     only), is one component of a     comprehensive MRSA colonization     surveillance program. It is not     intended to diagnose MRSA     infection nor to guide or     monitor treatment for     MRSA infections.     Studies: Dg Chest 2 View  10/10/2012   CLINICAL DATA:  Chest pain. Hypertension.  EXAM: CHEST  2 VIEW  COMPARISON:  08/31/2012  FINDINGS: There is dense consolidation in the lingula and left lower lobe. Right basilar subsegmental atelectasis. Normal heart size. No pneumothorax.  IMPRESSION: There is consolidation in the lingula and left lower lobe compatible with pneumonia.   Electronically Signed   By: Maryclare Bean M.D.   On: 10/10/2012 17:03    Scheduled Meds: . ceFEPime (MAXIPIME) IV  1 g Intravenous Q8H  . enoxaparin (LOVENOX) injection  40 mg Subcutaneous Q24H  . famotidine  20 mg Oral QHS  . folic acid  1 mg Oral Daily  .  influenza vac split  quadrivalent PF  0.5 mL Intramuscular Tomorrow-1000  . LORazepam  0-4 mg Intravenous Q6H   Followed by  . [START ON 10/13/2012] LORazepam  0-4 mg Intravenous Q12H  . multivitamin with minerals  1 tablet Oral Daily  . pneumococcal 23 valent vaccine  0.5 mL Intramuscular Tomorrow-1000  . potassium chloride  40 mEq Oral Q3H  . thiamine  500 mg Oral Daily  . vancomycin  1,250 mg Intravenous Q8H   Continuous Infusions: . sodium chloride 50 mL/hr at 10/11/12 0700    Principal Problem:   Acute respiratory failure Active Problems:   Alcohol abuse   Hyponatremia   Anemia of chronic disease   Abnormal liver function tests   Moderate protein-calorie malnutrition   HCAP (healthcare-associated pneumonia)  Time spent: 69  Pamella Pert, MD [facility] Pager [phone]. If 7 PM - 7 AM, please contact night-coverage at [email or web address], password [facility] 10/12/2012, 7:05 AM  LOS: 2 days

## 2012-10-13 ENCOUNTER — Inpatient Hospital Stay (HOSPITAL_COMMUNITY): Payer: No Typology Code available for payment source

## 2012-10-13 LAB — APTT: aPTT: 38 seconds — ABNORMAL HIGH (ref 24–37)

## 2012-10-13 LAB — BASIC METABOLIC PANEL
CO2: 24 mEq/L (ref 19–32)
GFR calc Af Amer: >90 mL/min (ref >90)
Glucose, Bld: 94 mg/dL (ref 70–99)
Potassium: 3.2 mEq/L — ABNORMAL LOW (ref 3.5–5.1)
Sodium: 129 mEq/L — ABNORMAL LOW (ref 135–145)

## 2012-10-13 LAB — CBC
Hemoglobin: 9.3 g/dL — ABNORMAL LOW (ref 13.0–17.0)
MCV: 105.6 fL — ABNORMAL HIGH (ref 78.0–100.0)
Platelets: 292 10*3/uL (ref 150–400)
RBC: 2.67 MIL/uL — ABNORMAL LOW (ref 4.22–5.81)

## 2012-10-13 LAB — PHOSPHORUS: Phosphorus: 3 mg/dL (ref 2.3–4.6)

## 2012-10-13 LAB — OSMOLALITY: Osmolality: 272 mOsm/kg — ABNORMAL LOW (ref 275–300)

## 2012-10-13 LAB — CULTURE, RESPIRATORY

## 2012-10-13 LAB — PROTIME-INR
INR: 1.29 (ref 0.00–1.49)
Prothrombin Time: 15.8 seconds — ABNORMAL HIGH (ref 11.6–15.2)

## 2012-10-13 LAB — CULTURE, RESPIRATORY W GRAM STAIN

## 2012-10-13 MED ORDER — SODIUM CHLORIDE 0.9 % IV SOLN
1.0000 g | Freq: Three times a day (TID) | INTRAVENOUS | Status: DC
  Administered 2012-10-13 – 2012-10-19 (×18): 1 g via INTRAVENOUS
  Filled 2012-10-13 (×20): qty 1

## 2012-10-13 MED ORDER — POTASSIUM CHLORIDE CRYS ER 20 MEQ PO TBCR
40.0000 meq | EXTENDED_RELEASE_TABLET | Freq: Once | ORAL | Status: AC
  Administered 2012-10-13: 40 meq via ORAL
  Filled 2012-10-13: qty 2

## 2012-10-13 MED ORDER — SODIUM CHLORIDE 0.9 % IV SOLN
1.0000 g | Freq: Once | INTRAVENOUS | Status: AC
  Administered 2012-10-13: 1 g via INTRAVENOUS
  Filled 2012-10-13: qty 1

## 2012-10-13 MED ORDER — PANTOPRAZOLE SODIUM 40 MG PO TBEC
40.0000 mg | DELAYED_RELEASE_TABLET | Freq: Every day | ORAL | Status: DC
  Administered 2012-10-13 – 2012-10-14 (×2): 40 mg via ORAL
  Filled 2012-10-13 (×3): qty 1

## 2012-10-13 MED ORDER — MAGNESIUM SULFATE 40 MG/ML IJ SOLN
2.0000 g | Freq: Once | INTRAMUSCULAR | Status: AC
  Administered 2012-10-13: 2 g via INTRAVENOUS
  Filled 2012-10-13: qty 50

## 2012-10-13 MED ORDER — PNEUMOCOCCAL VAC POLYVALENT 25 MCG/0.5ML IJ INJ
0.5000 mL | INJECTION | Freq: Once | INTRAMUSCULAR | Status: AC
  Administered 2012-10-13: 0.5 mL via INTRAMUSCULAR
  Filled 2012-10-13: qty 0.5

## 2012-10-13 MED ORDER — INFLUENZA VAC SPLIT QUAD 0.5 ML IM SUSP
0.5000 mL | Freq: Once | INTRAMUSCULAR | Status: AC
  Administered 2012-10-13: 0.5 mL via INTRAMUSCULAR
  Filled 2012-10-13: qty 0.5

## 2012-10-13 MED ORDER — MAGNESIUM SULFATE 50 % IJ SOLN: 2.0000 g | Freq: Once | INTRAVENOUS | Status: DC

## 2012-10-13 NOTE — Progress Notes (Signed)
Attempted to titrate O2 off.  O2 sats dropped to 5% quickly on room air.

## 2012-10-13 NOTE — Progress Notes (Signed)
ANTIBIOTIC CONSULT NOTE - INITIAL  Pharmacy Consult for meropenem Indication: GNR bacteremia  No Known Allergies  Patient Measurements: Height: 5' 6.93" (170 cm) Weight: 154 lb 12.2 oz (70.2 kg) IBW/kg (Calculated) : 65.94   Vital Signs: Temp: 99.6 F (37.6 C) (10/11 0800) Temp src: Oral (10/11 0800) BP: 126/76 mmHg (10/11 0800) Pulse Rate: 118 (10/11 0800) Intake/Output from previous day: 10/10 0701 - 10/11 0700 In: 1900 [I.V.:1000; IV Piggyback:900] Out: 1050 [Urine:1050] Intake/Output from this shift: Total I/O In: -  Out: 350 [Urine:350]  Labs:  Recent Labs  10/11/12 0332 10/12/12 0515 10/13/12 0354  WBC 18.3* 12.2* 12.8*  HGB 13.0 10.2* 9.3*  PLT 292 266 292  CREATININE 0.52 0.50 0.57   Estimated Creatinine Clearance: 109.8 ml/min (by C-G formula based on Cr of 0.57).  Recent Labs  10/12/12 0515  VANCOTROUGH 9.7*     Microbiology: 10/8 blood x2: GNR 10/9 sputum: nml oropharyngeal flora- pending S. Pneumonia Ag: POSITIVE Legionella Ag: negative  Medical History: Past Medical History  Diagnosis Date  . Hypertension   . Broken femur     right  . PONV (postoperative nausea and vomiting)     Assessment: 19 yoM admitted 10/8 with progressively worsening SOB, associated with fevers, chills, nausea, and poor PO intake. Pt was recently admitted 8/28-9/5 for acute alcohol intoxication and seizures. Pt is well known to Pharmacy for antibiotic consults, and Pharmacy has been consulted to dose meropenem for GNR bacteremia.   Antibiotics changed this AM to include addition of anaerobic coverage in this pt with a Hx of EtOH abuse, Primaxin has not been added due to a Hx of seizures per CCM MD note 10/11. Pt noted to have worsening hemoptysis this AM. Tm 103.1 WBC elevated at 12.8  Lactic acid now decreased to 0.6  Cultures as above  SCr=0.57 and is stable  Goal of Therapy:  eradication of infection  Plan:  - meropenem 1g IV q8 - follow-up clinical  course, culture results, renal function - follow-up antibiotic de-escalation and length of therapy  Thank you for the consult.  Tomi Bamberger, PharmD Clinical Pharmacist Pager: [phone] Pharmacy: [phone] 10/13/2012 10:20 AM

## 2012-10-13 NOTE — Progress Notes (Signed)
[facility] PROGRESS NOTE  Eugene Jackson FAO:[id number] DOB: 31-Jul-1970 DOA: 10/10/2012 PCP: Nonnie Done., MD  Assessment/Plan: Acute hypoxic respiratory failure - secondary to PNA, will treat as HCAP given recent hospitalization. Patient's breathing is stable this morning. Continue monitoring in SDU. Continue Vanc/Cefepime - blood tinges sputum initially improving however overnight nursing staff reports that he has had more hemoptysis. Will obtain CT chest, Pulmonology consulted, appreciate input. GNR bacteremia - On cefepime; suspect lung source; awaiting final report. HCAP (healthcare-associated pneumonia), SIRS - given fever, leukocytosis, tachycardia, elevated lactic acid, with evidence of PNA. Broad spectrum ABX as noted above  - afebrile this morning 99.6, 100% on 2L - WBC improving, stable Alcohol withdrawal  - continues to show signs of withdrawal, closely monitor. Scheduled Ativan. - worse overnight per nursing report, this morning AxOx4 Hyponatremia - secondary to pre renal etiology, stable today, minimally trending down Anemia of chronic disease - stable Moderate protein-calorie malnutrition  - secondary to acute on chronic illness, alcohol abuse  - may need nutrition consultation once appetite improves  Sinus tachycardia - due to active infection, ETOH withdrawal, fever. Closely monitor. Improving to low 100 from 140s on admission. DVT Prophylaxis - SCDs  Code Status: Full Family Communication: Pt in the room  Disposition Plan: SDU  Consultants:  Pulmonology  Procedures:  none   Antibiotics Vancomycin 10/8 >> Cefepime 10/8 >>  HPI/Subjective: - no complaints, AxOx4  Objective: Filed Vitals:   10/13/12 0200 10/13/12 0300 10/13/12 0400 10/13/12 0500  BP: 133/65  108/42   Pulse: 125 118 111 110  Temp: 99.9 F (37.7 C)  99.6 F (37.6 C)   TempSrc: Oral  Oral   Resp: 35 27 31 22   Height:      Weight:   70.2 kg (154 lb 12.2 oz)   SpO2: 99%  100% 100% 100%    Intake/Output Summary (Last 24 hours) at 10/13/12 0754 Last data filed at 10/13/12 0700  Gross per 24 hour  Intake   1900 ml  Output   1050 ml  Net    850 ml   Filed Weights   10/10/12 2100 10/12/12 0400 10/13/12 0400  Weight: 63.9 kg (140 lb 14 oz) 71.6 kg (157 lb 13.6 oz) 70.2 kg (154 lb 12.2 oz)    Exam:  General:  NAD, anxious/tremulous  Cardiovascular: regular rate and rhythm, without MRG, tachycardic  Respiratory: good air movement, mild decrease breath sounds on left, rhonchi present  Abdomen: soft, not tender to palpation, positive bowel sounds  MSK: no peripheral edema  Neuro: CN 2-12 grossly intact, MS 5/5 in all 4  Data Reviewed: Basic Metabolic Panel:  Recent Labs Lab 10/10/12 1646 10/10/12 2157 10/11/12 0332 10/12/12 0515 10/13/12 0354  NA 128*  --  133* 130* 129*  K 4.2  --  3.7 3.0* 3.2*  CL 92*  --  99 97 96  CO2 21  --  22 24 24   GLUCOSE 131*  --  100* 95 94  BUN 9  --  10 8 7   CREATININE 0.64  --  0.52 0.50 0.57  CALCIUM 9.6  --  8.9 8.1* 8.4  MG  --  1.5  --  1.9 1.9  PHOS  --  3.4  --  2.3 3.0   CBC:  Recent Labs Lab 10/10/12 1646 10/11/12 0332 10/12/12 0515 10/13/12 0354  WBC 19.1* 18.3* 12.2* 12.8*  NEUTROABS 16.2*  --   --   --   HGB 14.4 13.0 10.2* 9.3*  HCT 43.4 40.0 31.2* 28.2*  MCV 107.2* 109.0* 105.8* 105.6*  PLT 291 292 266 292    Recent Results (from the past 240 hour(s))  CULTURE, BLOOD (ROUTINE X 2)     Status: None   Collection Time    10/10/12  6:02 PM      Result Value Range Status   Specimen Description BLOOD RIGHT ANTECUBITAL   Final   Special Requests BOTTLES DRAWN AEROBIC AND ANAEROBIC 5CC   Final   Culture  Setup Time     Final   Value: 10/11/2012 02:20     Performed at [facility]   Culture     Final   Value: GRAM NEGATIVE RODS     Note: Gram Stain Report Called to,Read Back By and Verified With: MEGAN STOCKS ON 10/11/2012 AT 10:28P BY WILEJ     Performed at [facility]   Report Status PENDING   Incomplete  CULTURE, BLOOD (ROUTINE X 2)     Status: None   Collection Time    10/10/12  6:02 PM      Result Value Range Status   Specimen Description BLOOD LEFT ANTECUBITAL   Final   Special Requests BOTTLES DRAWN AEROBIC AND ANAEROBIC 5CC   Final   Culture  Setup Time     Final   Value: 10/11/2012 02:21     Performed at [facility]   Culture     Final   Value: GRAM NEGATIVE RODS     Note: Gram Stain Report Called to,Read Back By and Verified With: MEGAN STOCKS ON 10/11/2012 AT 10:28P BY [employer]     Performed at [facility]   Report Status PENDING   Incomplete  CULTURE, RESPIRATORY (NON-EXPECTORATED)     Status: None   Collection Time    10/10/12  9:46 PM      Result Value Range Status   Specimen Description SPUTUM   Final   Special Requests NONE   Final   Gram Stain     Final   Value: ABUNDANT WBC PRESENT, PREDOMINANTLY PMN     FEW SQUAMOUS EPITHELIAL CELLS PRESENT     ABUNDANT GRAM POSITIVE RODS     ABUNDANT GRAM POSITIVE COCCI IN PAIRS     FEW GRAM NEGATIVE RODS   Culture     Final   Value: NORMAL OROPHARYNGEAL FLORA     Performed at [facility]   Report Status PENDING   Incomplete  MRSA PCR SCREENING     Status: None   Collection Time    10/10/12  9:47 PM      Result Value Range Status   MRSA by PCR NEGATIVE  NEGATIVE Final   Comment:            The GeneXpert MRSA Assay (FDA     approved for NASAL specimens     only), is one component of a     comprehensive MRSA colonization     surveillance program. It is not     intended to diagnose MRSA     infection nor to guide or     monitor treatment for     MRSA infections.     Studies: No results found.  Scheduled Meds: . ceFEPime (MAXIPIME) IV  1 g Intravenous Q8H  . famotidine  20 mg Oral QHS  . folic acid  1 mg Oral Daily  . influenza vac split quadrivalent PF  0.5 mL Intramuscular Tomorrow-1000  . LORazepam  0-4 mg Intravenous Q6H   Followed by  .  LORazepam  0-4 mg Intravenous Q12H  . multivitamin with minerals  1 tablet Oral Daily  . pneumococcal 23 valent vaccine  0.5 mL Intramuscular Tomorrow-1000  . thiamine  500 mg Oral Daily  . vancomycin  1,250 mg Intravenous Q8H   Continuous Infusions: . sodium chloride 50 mL/hr at 10/12/12 2000    Principal Problem:   HCAP (healthcare-associated pneumonia) Active Problems:   Alcohol abuse   Hyponatremia   Anemia of chronic disease   Abnormal liver function tests   Moderate protein-calorie malnutrition   Acute respiratory failure   Bacteremia  Time spent: 76  Pamella Pert, MD [facility] Pager [phone]. If 7 PM - 7 AM, please contact night-coverage at [email or web address], password [facility] 10/13/2012, 7:54 AM  LOS: 3 days

## 2012-10-13 NOTE — Consult Note (Signed)
PULMONARY  / CRITICAL CARE MEDICINE  Name: Eugene Jackson MRN: [id number] DOB: [birth year]    ADMISSION DATE:  10/10/2012 CONSULTATION DATE:  10/13/12  REFERRING MD :  [facility] PRIMARY SERVICE: [facility]  CHIEF COMPLAINT:  Hemoptysis, PNA  BRIEF PATIENT DESCRIPTION: 42 ETOH, PNA, hemoptysis  SIGNIFICANT EVENTS / STUDIES:  10/11 ct chest>>>  LINES / TUBES:   CULTURES: 10/11 sputum>>> NF 10/11 BC x 2>>>gram neg rod>>>  ANTIBIOTICS: 10/8 cefipime>>>10/11 10/8 vanc>>>10/11 10/11 meropenem>>>  HISTORY OF PRESENT ILLNESS:  Pt is 42 yo male with history of alcohol abuse and seizures, HTN who presents to [facility] ED with main concern of progressively worsening shortness of breath. Pt reports  fevers, chills, nausea and poor oral intake, productive cough of clear to yellow sputum on admission. He explains he was just recently discharged from the hospital end of August and was doing well until several days prior to this admission when his symptoms started. He denies chest pain other than the one present with coughing spells.  Admitted by [facility] for PNA, found with progressive hemoptysis, worsening this am. BC pos for gram neg rods. Asked to consult. Tachy, remains on Jal o2  PAST MEDICAL HISTORY :  Past Medical History  Diagnosis Date  . Hypertension   . Broken femur     right  . PONV (postoperative nausea and vomiting)    Past Surgical History  Procedure Laterality Date  . Wrist surgery    . Joint replacement      bilateral hip replacement   Prior to Admission medications   Medication Sig Start Date End Date Taking? Authorizing Provider  amLODipine (NORVASC) 10 MG tablet Take 1 tablet (10 mg total) by mouth daily. 09/07/12  Yes Alison Murray, MD  cyclobenzaprine (FLEXERIL) 10 MG tablet Take 1 tablet (10 mg total) by mouth 2 (two) times daily as needed for muscle spasms. 09/07/12  Yes Alison Murray, MD  famotidine (PEPCID) 20 MG tablet Take 1 tablet (20 mg total) by mouth at bedtime. 09/07/12  Yes  Alison Murray, MD  Multiple Vitamin (MULTIVITAMIN WITH MINERALS) TABS tablet Take 1 tablet by mouth daily. 09/07/12  Yes Alison Murray, MD  thiamine 500 MG tablet Take 1 tablet (500 mg total) by mouth daily. 09/07/12  Yes Alison Murray, MD   No Known Allergies  FAMILY HISTORY:  History reviewed. No pertinent family history. SOCIAL HISTORY:  reports that he has been smoking Cigarettes.  He has a 13 pack-year smoking history. He has never used smokeless tobacco. He reports that he drinks alcohol. He reports that he does not use illicit drugs.  REVIEW OF SYSTEMS:  No burning urination, no CP, no falls, no wt loss, all else neg  SUBJECTIVE:  Some blood coughing up  VITAL SIGNS: Temp:  [99.6 F (37.6 C)-103.1 F (39.5 C)] 99.6 F (37.6 C) (10/11 0800) Pulse Rate:  [110-137] 118 (10/11 0800) Resp:  [22-36] 27 (10/11 0800) BP: (108-160)/(42-76) 126/76 mmHg (10/11 0800) SpO2:  [92 %-100 %] 100 % (10/11 0900) Weight:  [70.2 kg (154 lb 12.2 oz)] 70.2 kg (154 lb 12.2 oz) (10/11 0400) HEMODYNAMICS:   VENTILATOR SETTINGS:   INTAKE / OUTPUT: Intake/Output     10/10 0701 - 10/11 0700 10/11 0701 - 10/12 0700   I.V. (mL/kg) 1000 (14.2)    IV Piggyback 900    Total Intake(mL/kg) 1900 (27.1)    Urine (mL/kg/hr) 1050 (0.6) 350 (2)   Total Output 1050 350   Net +850 -  350        Urine Occurrence 7 x      PHYSICAL EXAMINATION: General:  Awake, O x 2 Neuro:  rass -2 ativan, mild tremor HEENT:  jvd wnl Cardiovascular:  s1 s2 RRT no r Lungs:  ronchi Abdomen:  Soft, bs wnl, no r Skin:  No rash  LABS:  CBC Recent Labs     10/11/12  0332  10/12/12  0515  10/13/12  0354  WBC  18.3*  12.2*  12.8*  HGB  13.0  10.2*  9.3*  HCT  40.0  31.2*  28.2*  PLT  292  266  292   Coag's Recent Labs     10/10/12  2157  INR  1.48   BMET Recent Labs     10/11/12  0332  10/12/12  0515  10/13/12  0354  NA  133*  130*  129*  K  3.7  3.0*  3.2*  CL  99  97  96  CO2  22  24  24   BUN  10  8  7    CREATININE  0.52  0.50  0.57  GLUCOSE  100*  95  94   Electrolytes Recent Labs     10/10/12  2157  10/11/12  0332  10/12/12  0515  10/13/12  0354  CALCIUM   --   8.9  8.1*  8.4  MG  1.5   --   1.9  1.9  PHOS  3.4   --   2.3  3.0   Sepsis Markers No results found for this basename: LACTICACIDVEN, PROCALCITON, O2SATVEN,  in the last 72 hours ABG Recent Labs     10/10/12  2154  PHART  7.425  PCO2ART  30.3*  PO2ART  94.8   Liver Enzymes Recent Labs     10/12/12  0515  AST  14  ALT  7  ALKPHOS  92  BILITOT  0.8  ALBUMIN  2.0*   Cardiac Enzymes No results found for this basename: TROPONINI, PROBNP,  in the last 72 hours Glucose No results found for this basename: GLUCAP,  in the last 72 hours  Imaging No results found.   CXR: sucked in left base infiltrate  ASSESSMENT / PLAN:  PULMONARY A: PNA, hemoptysis R/o necrtozing PNA R/o aspiration in ETOH setting R/o effusion associated R/o mass P:   pcxr noted, repeat in am  Would CT chest evaluate left base infiltrate, associated effusion, no fall reported O2 needs low See ID for ABX changes Quantiferron gold  CARDIOVASCULAR A: ST, etoh WD P:  Per [facility], ciwa in sdu status If ICU status would have MD driven treatment  RENAL A:  Hypokalemia, hypomagnesemia in setting etoh wd, hyponatremia, r/o siadh vs hypovolemia P:   replace lytes , recheck in am No lasix Fluid restriction, urine na, osm, serum osm  HEMATOLOGIC A:  Anemia, hemoptysis P:  Repeat coags For chest CT scd  INFECTIOUS A:  PNA, bacteremia gram neg, r/o polymicrobial aspiration, r/o necrotzing PNA P:   Would favor additional anaerobic in this ETOH pt Dc cefepime, add meropenem (seziures in past) CT chest Dc vanc Echo for veg  NEUROLOGIC A:  Etoh WD P:   Per primary  TODAY'S SUMMARY: for CT chest, change to meropnem, dc vanc  I have personally obtained a history, examined the patient, evaluated laboratory and imaging  results, formulated the assessment and plan and placed orders.  Mcarthur Rossetti. Tyson Alias, MD, FACP Pgr: [phone] [facility]  Pulmonary and Critical Care Medicine [facility] Pager: [phone]  10/13/2012, 9:32 AM

## 2012-10-14 DIAGNOSIS — I519 Heart disease, unspecified: Secondary | ICD-10-CM

## 2012-10-14 LAB — BASIC METABOLIC PANEL
CO2: 23 mEq/L (ref 19–32)
Calcium: 8.6 mg/dL (ref 8.4–10.5)
Glucose, Bld: 101 mg/dL — ABNORMAL HIGH (ref 70–99)
Potassium: 3.3 mEq/L — ABNORMAL LOW (ref 3.5–5.1)
Sodium: 131 mEq/L — ABNORMAL LOW (ref 135–145)

## 2012-10-14 LAB — CBC
Hemoglobin: 10.3 g/dL — ABNORMAL LOW (ref 13.0–17.0)
MCH: 34.8 pg — ABNORMAL HIGH (ref 26.0–34.0)
MCHC: 33.2 g/dL (ref 30.0–36.0)
RBC: 2.96 MIL/uL — ABNORMAL LOW (ref 4.22–5.81)

## 2012-10-14 NOTE — Progress Notes (Signed)
  Echocardiogram 2D Echocardiogram has been performed.  Arvil Chaco 10/14/2012, 11:29 AM

## 2012-10-14 NOTE — Consult Note (Signed)
PULMONARY  / CRITICAL CARE MEDICINE  Name: Eugene Jackson MRN: [id number] DOB: 03-03-1970    ADMISSION DATE:  10/10/2012 CONSULTATION DATE:  10/13/12  REFERRING MD :  [facility] PRIMARY SERVICE: [facility]  CHIEF COMPLAINT:  Hemoptysis, PNA  BRIEF PATIENT DESCRIPTION: 42 ETOH, PNA, hemoptysis  SIGNIFICANT EVENTS / STUDIES:  10/11 ct chest>>>. Extensive pneumonia, seen throughout both lungs but most evident  in the left lower lobe and in the lingula of the left upper lobe,  but also significant in the right upper lobe. There are associated  pleural effusions, larger on the left, where it mostly accumulates  in the mid to upper hemi thorax and along the left superior oblique  fissure.  LINES / TUBES:  CULTURES: 10/11 sputum>>> NF 10/11 BC x 2>>>gram neg rod>>>  ANTIBIOTICS: 10/8 cefipime>>>10/11 10/8 vanc>>>10/11 10/11 meropenem>>>  SUBJECTIVE:  coughing up blood about same day prior  VITAL SIGNS: Temp:  [99.2 F (37.3 C)-100.3 F (37.9 C)] 99.2 F (37.3 C) (10/12 0803) Pulse Rate:  [114-125] 115 (10/12 0803) Resp:  [19-38] 26 (10/12 0803) BP: (119-129)/(63-78) 119/69 mmHg (10/12 0803) SpO2:  [97 %-100 %] 99 % (10/12 0803) HEMODYNAMICS:   VENTILATOR SETTINGS:   INTAKE / OUTPUT: Intake/Output     10/11 0701 - 10/12 0700 10/12 0701 - 10/13 0700   I.V. (mL/kg) 1150 (16.4)    Other 100    IV Piggyback 187.5    Total Intake(mL/kg) 1437.5 (20.5)    Urine (mL/kg/hr) 1525 (0.9) 125 (0.6)   Total Output 1525 125   Net -87.5 -125        Urine Occurrence 6 x    Stool Occurrence 1 x      PHYSICAL EXAMINATION: General:  Awake, O x 2 Neuro:  rass -1, improved tremor HEENT:  jvd wnl Cardiovascular:  s1 s2 RRT no r Lungs:  ronchi bases Abdomen:  Soft, bs wnl, no r Skin:  No rash  LABS:  CBC Recent Labs     10/12/12  0515  10/13/12  0354  10/14/12  0817  WBC  12.2*  12.8*  10.6*  HGB  10.2*  9.3*  10.3*  HCT  31.2*  28.2*  31.0*  PLT  266  292  416*    Coag's Recent Labs     10/13/12  1055  APTT  38*  INR  1.29   BMET Recent Labs     10/12/12  0515  10/13/12  0354  10/14/12  0817  NA  130*  129*  131*  K  3.0*  3.2*  3.3*  CL  97  96  96  CO2  24  24  23   BUN  8  7  8   CREATININE  0.50  0.57  0.44*  GLUCOSE  95  94  101*   Electrolytes Recent Labs     10/12/12  0515  10/13/12  0354  10/14/12  0817  CALCIUM  8.1*  8.4  8.6  MG  1.9  1.9   --   PHOS  2.3  3.0   --    Sepsis Markers No results found for this basename: LACTICACIDVEN, PROCALCITON, O2SATVEN,  in the last 72 hours ABG No results found for this basename: PHART, PCO2ART, PO2ART,  in the last 72 hours Liver Enzymes Recent Labs     10/12/12  0515  AST  14  ALT  7  ALKPHOS  92  BILITOT  0.8  ALBUMIN  2.0*   Cardiac Enzymes  No results found for this basename: TROPONINI, PROBNP,  in the last 72 hours Glucose No results found for this basename: GLUCAP,  in the last 72 hours  Imaging Ct Chest Wo Contrast  10/13/2012   CLINICAL DATA:  Patient having more hemoptysis. Patient on antibiotics for pneumonia.  EXAM: CT CHEST WITHOUT CONTRAST  TECHNIQUE: Multidetector CT imaging of the chest was performed following the standard protocol without IV contrast.  COMPARISON:  Chest radiograph, 10/10/2012  FINDINGS: There is extensive consolidation throughout much of the left lower lobe, the lingula of the left upper lobe and in the patchy distribution throughout the right upper lobe and right middle lobe with smaller areas in the right lower lobe. Two mass like focal opacities are noted laterally in the right lower lobe, largest measuring 2.6 cm in greatest transverse dimension. These are likely focal areas of consolidation. There are small bilateral pleural effusions. Pleural fluid tracks along the obliques fissure on the left. There is some left upper lobe dependent atelectasis adjacent to the pleural fluid.  There is mediastinal adenopathy. Reference measurements  were made of several. There is a prevascular lymph node measuring 10 mm in short axis, a right peritracheal lymph node measuring 11 mm in short axis and a right subcarinal lymph node measuring 8 mm in short axis. These are all likely reactive.  The heart is normal in size. There are moderate coronary artery calcifications. No neck base or axillary adenopathy is seen.  Limited evaluation of the upper abdomen shows evidence of fatty infiltration of the liver but is otherwise unremarkable.  There are degenerative changes of the visualized spine. No osteoblastic or osteolytic lesions.  IMPRESSION: 1. Extensive pneumonia, seen throughout both lungs but most evident in the left lower lobe and in the lingula of the left upper lobe, but also significant in the right upper lobe. There are associated pleural effusions, larger on the left, where it mostly accumulates in the mid to upper hemi thorax and along the left superior oblique fissure. 2. Reactive adenopathy. 3. Multifocal pneumonia appears worsened when compared to the prior chest radiograph.   Electronically Signed   By: Amie Portland M.D.   On: 10/13/2012 12:00     CXR: sucked in left base infiltrate  ASSESSMENT / PLAN:  PULMONARY A: PNA, hemoptysis Likely necrtozing PNA R/o aspiration in ETOH setting effusion associated P:   pcxr in am  Continue polymicrobial treatment Quantiferron gold-pending Stay in ICU, follow hemoptysis volume coags wnl noted  CARDIOVASCULAR A: ST, etoh WD improved P:  Per [facility], ciwa in sdu status If ICU status would have MD driven treatment  RENAL A:  Hypokalemia, hypomagnesemia in setting etoh wd, hyponatremia, r/o siadh (does have inappropriate concentration ) P:   No lasix, but low threshold Fluid restriction  HEMATOLOGIC A:  Anemia, hemoptysis P:  Repeat coags wnl scd hct is improved  INFECTIOUS A:  PNA, bacteremia gram neg, r/o polymicrobial aspiration, r/o necrotzing PNA P:   Would favor  additional anaerobic in this ETOH pt meropenem (seziures in past) - maintain Echo for veg awaited  NEUROLOGIC A:  Etoh WD P:   Per primary improved  TODAY'S SUMMARY: maintain meropenem, replace K   I have personally obtained a history, examined the patient, evaluated laboratory and imaging results, formulated the assessment and plan and placed orders.  Mcarthur Rossetti. Tyson Alias, MD, FACP Pgr: [phone] [place] [facility]  Pulmonary and Critical Care Medicine [facility] Pager: [phone]  10/14/2012, 10:11 AM

## 2012-10-14 NOTE — Progress Notes (Signed)
Pt had a temp of 102.7 orally this afternoon. Pt given 650mg  of po tylenol. F/u temp is 100.5 orally.

## 2012-10-14 NOTE — Progress Notes (Signed)
[facility] PROGRESS NOTE  Eugene Jackson AVW:[id number] DOB: Oct 26, 1970 DOA: 10/10/2012 PCP: Nonnie Done., MD  Assessment/Plan: Acute hypoxic respiratory failure - secondary to PNA. Patient's breathing is stable this morning - continue monitoring in SDU GNR bacteremia  - on Meropenem. No history of drug resistant organisms, awaiting speciation.  HCAP (healthcare-associated pneumonia), SIRS - given fever, leukocytosis, tachycardia, elevated lactic acid, with evidence of PNA.  - WBC improving, stable - CT yesterday with worsening multifocal pneumonia. Pulmonary consulted on 10/11, appreciate input. - still coughing up blood tinged sputum.  Alcohol withdrawal  - continues to show signs of withdrawal, closely monitor, seems improved 10/12 morning, and nursing reports a better night.  Hyponatremia - stable Anemia of chronic disease - stable Moderate protein-calorie malnutrition  - secondary to acute on chronic illness, alcohol abuse  - may need nutrition consultation once appetite improves  Sinus tachycardia - due to active infection, ETOH withdrawal, fever. Improving.  DVT Prophylaxis - SCDs  Code Status: Full Family Communication: Pt in the room  Disposition Plan: SDU  Consultants:  Pulmonology  Procedures:  none   Antibiotics Vancomycin 10/8 >> Cefepime 10/8 >>10/11 Meropenem 10/11 >>  HPI/Subjective: - feels better this morning  Objective: Filed Vitals:   10/13/12 1735 10/13/12 2000 10/13/12 2339 10/14/12 0400  BP:  124/63 124/68 120/70  Pulse:    114  Temp:  99.4 F (37.4 C) 100.3 F (37.9 C) 100 F (37.8 C)  TempSrc:  Oral Oral Oral  Resp:    26  Height:      Weight:      SpO2: 100%   100%    Intake/Output Summary (Last 24 hours) at 10/14/12 0717 Last data filed at 10/14/12 0700  Gross per 24 hour  Intake  787.5 ml  Output   1325 ml  Net -537.5 ml   Filed Weights   10/10/12 2100 10/12/12 0400 10/13/12 0400  Weight: 63.9 kg (140 lb 14  oz) 71.6 kg (157 lb 13.6 oz) 70.2 kg (154 lb 12.2 oz)   Exam:  General:  NAD, anxious/tremulous - improved today  Cardiovascular: regular rate and rhythm, without MRG, tachycardic  Respiratory: good air movement, mild decrease breath sounds on left, rhonchi present  Abdomen: soft, not tender to palpation, positive bowel sounds  MSK: no peripheral edema  Neuro: CN 2-12 grossly intact, MS 5/5 in all 4  Data Reviewed: Basic Metabolic Panel:  Recent Labs Lab 10/10/12 1646 10/10/12 2157 10/11/12 0332 10/12/12 0515 10/13/12 0354 10/14/12 0817  NA 128*  --  133* 130* 129* 131*  K 4.2  --  3.7 3.0* 3.2* 3.3*  CL 92*  --  99 97 96 96  CO2 21  --  22 24 24 23   GLUCOSE 131*  --  100* 95 94 101*  BUN 9  --  10 8 7 8   CREATININE 0.64  --  0.52 0.50 0.57 0.44*  CALCIUM 9.6  --  8.9 8.1* 8.4 8.6  MG  --  1.5  --  1.9 1.9  --   PHOS  --  3.4  --  2.3 3.0  --    CBC:  Recent Labs Lab 10/10/12 1646 10/11/12 0332 10/12/12 0515 10/13/12 0354 10/14/12 0817  WBC 19.1* 18.3* 12.2* 12.8* 10.6*  NEUTROABS 16.2*  --   --   --   --   HGB 14.4 13.0 10.2* 9.3* 10.3*  HCT 43.4 40.0 31.2* 28.2* 31.0*  MCV 107.2* 109.0* 105.8* 105.6* 104.7*  PLT 291  292 266 292 416*    Recent Results (from the past 240 hour(s))  CULTURE, BLOOD (ROUTINE X 2)     Status: None   Collection Time    10/10/12  6:02 PM      Result Value Range Status   Specimen Description BLOOD RIGHT ANTECUBITAL   Final   Special Requests BOTTLES DRAWN AEROBIC AND ANAEROBIC 5CC   Final   Culture  Setup Time     Final   Value: 10/11/2012 02:20     Performed at [facility]   Culture     Final   Value: GRAM NEGATIVE RODS     Note: Gram Stain Report Called to,Read Back By and Verified With: MEGAN STOCKS ON 10/11/2012 AT 10:28P BY WILEJ     Performed at [facility]   Report Status PENDING   Incomplete  CULTURE, BLOOD (ROUTINE X 2)     Status: None   Collection Time    10/10/12  6:02 PM      Result Value  Range Status   Specimen Description BLOOD LEFT ANTECUBITAL   Final   Special Requests BOTTLES DRAWN AEROBIC AND ANAEROBIC 5CC   Final   Culture  Setup Time     Final   Value: 10/11/2012 02:21     Performed at [facility]   Culture     Final   Value: GRAM NEGATIVE RODS     Note: Gram Stain Report Called to,Read Back By and Verified With: MEGAN STOCKS ON 10/11/2012 AT 10:28P BY [employer]     Performed at [facility]   Report Status PENDING   Incomplete  CULTURE, RESPIRATORY (NON-EXPECTORATED)     Status: None   Collection Time    10/10/12  9:46 PM      Result Value Range Status   Specimen Description SPUTUM   Final   Special Requests NONE   Final   Gram Stain     Final   Value: ABUNDANT WBC PRESENT, PREDOMINANTLY PMN     FEW SQUAMOUS EPITHELIAL CELLS PRESENT     ABUNDANT GRAM POSITIVE RODS     ABUNDANT GRAM POSITIVE COCCI IN PAIRS     FEW GRAM NEGATIVE RODS   Culture     Final   Value: NORMAL OROPHARYNGEAL FLORA     Performed at [facility]   Report Status 10/13/2012 FINAL   Final  MRSA PCR SCREENING     Status: None   Collection Time    10/10/12  9:47 PM      Result Value Range Status   MRSA by PCR NEGATIVE  NEGATIVE Final   Comment:            The GeneXpert MRSA Assay (FDA     approved for NASAL specimens     only), is one component of a     comprehensive MRSA colonization     surveillance program. It is not     intended to diagnose MRSA     infection nor to guide or     monitor treatment for     MRSA infections.     Studies: Ct Chest Wo Contrast  10/13/2012   CLINICAL DATA:  Patient having more hemoptysis. Patient on antibiotics for pneumonia.  EXAM: CT CHEST WITHOUT CONTRAST  TECHNIQUE: Multidetector CT imaging of the chest was performed following the standard protocol without IV contrast.  COMPARISON:  Chest radiograph, 10/10/2012  FINDINGS: There is extensive consolidation throughout much of the left  lower lobe, the lingula of the left upper  lobe and in the patchy distribution throughout the right upper lobe and right middle lobe with smaller areas in the right lower lobe. Two mass like focal opacities are noted laterally in the right lower lobe, largest measuring 2.6 cm in greatest transverse dimension. These are likely focal areas of consolidation. There are small bilateral pleural effusions. Pleural fluid tracks along the obliques fissure on the left. There is some left upper lobe dependent atelectasis adjacent to the pleural fluid.  There is mediastinal adenopathy. Reference measurements were made of several. There is a prevascular lymph node measuring 10 mm in short axis, a right peritracheal lymph node measuring 11 mm in short axis and a right subcarinal lymph node measuring 8 mm in short axis. These are all likely reactive.  The heart is normal in size. There are moderate coronary artery calcifications. No neck base or axillary adenopathy is seen.  Limited evaluation of the upper abdomen shows evidence of fatty infiltration of the liver but is otherwise unremarkable.  There are degenerative changes of the visualized spine. No osteoblastic or osteolytic lesions.  IMPRESSION: 1. Extensive pneumonia, seen throughout both lungs but most evident in the left lower lobe and in the lingula of the left upper lobe, but also significant in the right upper lobe. There are associated pleural effusions, larger on the left, where it mostly accumulates in the mid to upper hemi thorax and along the left superior oblique fissure. 2. Reactive adenopathy. 3. Multifocal pneumonia appears worsened when compared to the prior chest radiograph.   Electronically Signed   By: Amie Portland M.D.   On: 10/13/2012 12:00    Scheduled Meds: . famotidine  20 mg Oral QHS  . folic acid  1 mg Oral Daily  . LORazepam  0-4 mg Intravenous Q12H  . meropenem (MERREM) IV  1 g Intravenous Q8H  . multivitamin with minerals  1 tablet Oral Daily  . pantoprazole  40 mg Oral Q1200  .  thiamine  500 mg Oral Daily   Continuous Infusions: . sodium chloride 1,000 mL (10/13/12 2056)    Principal Problem:   HCAP (healthcare-associated pneumonia) Active Problems:   Alcohol abuse   Hyponatremia   Anemia of chronic disease   Abnormal liver function tests   Moderate protein-calorie malnutrition   Acute respiratory failure   Bacteremia  Time spent: 49  Pamella Pert, MD [facility] Pager [phone]. If 7 PM - 7 AM, please contact night-coverage at [email or web address], password [facility] 10/14/2012, 7:17 AM  LOS: 4 days

## 2012-10-15 DIAGNOSIS — R042 Hemoptysis: Secondary | ICD-10-CM | POA: Diagnosis not present

## 2012-10-15 DIAGNOSIS — R7881 Bacteremia: Secondary | ICD-10-CM

## 2012-10-15 DIAGNOSIS — F17200 Nicotine dependence, unspecified, uncomplicated: Secondary | ICD-10-CM | POA: Diagnosis present

## 2012-10-15 LAB — BASIC METABOLIC PANEL
CO2: 25 mEq/L (ref 19–32)
Calcium: 8.3 mg/dL — ABNORMAL LOW (ref 8.4–10.5)
Chloride: 99 mEq/L (ref 96–112)
Creatinine, Ser: 0.43 mg/dL — ABNORMAL LOW (ref 0.50–1.35)
GFR calc non Af Amer: >90 mL/min (ref >90)
Glucose, Bld: 135 mg/dL — ABNORMAL HIGH (ref 70–99)
Sodium: 134 mEq/L — ABNORMAL LOW (ref 135–145)

## 2012-10-15 LAB — QUANTIFERON TB GOLD ASSAY (BLOOD)
Mitogen value: 0.24 IU/mL
Quantiferon Nil Value: 0.05 IU/mL
TB Ag value: 0.02 IU/mL
TB Antigen Minus Nil Value: 0 IU/mL

## 2012-10-15 LAB — CBC
HCT: 29.4 % — ABNORMAL LOW (ref 39.0–52.0)
Hemoglobin: 9.8 g/dL — ABNORMAL LOW (ref 13.0–17.0)
MCH: 34.5 pg — ABNORMAL HIGH (ref 26.0–34.0)
MCHC: 33.3 g/dL (ref 30.0–36.0)
MCV: 103.5 fL — ABNORMAL HIGH (ref 78.0–100.0)
Platelets: 457 10*3/uL — ABNORMAL HIGH (ref 150–400)
RBC: 2.84 MIL/uL — ABNORMAL LOW (ref 4.22–5.81)
RDW: 15.8 % — ABNORMAL HIGH (ref 11.5–15.5)
WBC: 9.9 10*3/uL (ref 4.0–10.5)

## 2012-10-15 MED ORDER — POTASSIUM CHLORIDE CRYS ER 20 MEQ PO TBCR
40.0000 meq | EXTENDED_RELEASE_TABLET | Freq: Once | ORAL | Status: AC
  Administered 2012-10-15: 40 meq via ORAL
  Filled 2012-10-15: qty 2

## 2012-10-15 NOTE — Progress Notes (Signed)
CARE MANAGEMENT NOTE 10/15/2012  Patient:  MALAKYE, NOLDEN   Account Number:  [id number]  Date Initiated:  10/11/2012  Documentation initiated by:  Vernis Cabacungan  Subjective/Objective Assessment:   pt with hx of etoh now presents with pna     Action/Plan:   home when stable   Anticipated DC Date:  10/18/2012   Anticipated DC Plan:  HOME/SELF CARE  In-house referral  Clinical Social Worker      DC Planning Services  NA      [facility] Choice  NA   Choice offered to / List presented to:  NA   DME arranged  NA      DME agency  NA     HH arranged  NA      HH agency  NA   Status of service:  In process, will continue to follow Medicare Important Message given?  NA - LOS <3 / Initial given by admissions (If response is "NO", the following Medicare IM given date fields will be blank) Date Medicare IM given:   Date Additional Medicare IM given:    Discharge Disposition:    Per UR Regulation:  Reviewed for med. necessity/level of care/duration of stay  If discussed at Long Length of Stay Meetings, dates discussed:    Comments:  [id number]/Shamara Soza Stark Jock, BSN, [place] [phone] Chart Reviewed for discharge and hospital needs. Discharge needs at time of review:  None  Patient transferred to tele bed. Review of patient progress due on [id number].   [id number]/HYQMVH Earlene Plater RN, BSN, [place] [phone] Chart Reviewed for discharge and hospital needs. Discharge needs at time of review:  None Review of patient progress due on [id number].

## 2012-10-15 NOTE — Progress Notes (Addendum)
PULMONARY  / CRITICAL CARE MEDICINE  Name: Eugene Jackson MRN: [id number] DOB: March 05, 1970    ADMISSION DATE:  10/10/2012 CONSULTATION DATE:  10/13/2012  REFERRING MD : Elvera Lennox  CHIEF COMPLAINT: Cough  BRIEF PATIENT DESCRIPTION:  42 yo male smoker presented with dyspnea, fever, chills, nausea and productive cough.  Noted to have acute hypoxic respiratory failure and hyponatremia from HCAP and ETOH.  Developed hemoptysis 10/11 and PCCM consulted.  SIGNIFICANT EVENTS: 10/08 Admit 10/11 Hemoptysis >> PCCM consulted  STUDIES:  10/11 CT chest >> LLL, lingular, Lt upper lobe consolidation; patchy RUL and RML infiltrate; two lesions RLL mass vs consolidation; small b/l effusions 10/12 Echo >> EF 55%, grade 1 diastolic dysfx  LINES / TUBES: PIV  CULTURES: Blood 10/08 >> GNR >>  Sputum 10/08 >> oral flora HIV 10/08 >> Non reactive Pneumococcal Ag 10/09 >> POSITIVE  Legionella Ag 10/09 >> Negative Quantiferon gold 10/11 >>   ANTIBIOTICS: Cefepime 10/08 >> 10/10 Zosyn 10/08 >> 10/08  Vancomycin 10/08 >> 10/10 Meropenem 10/11 >>   SUBJECTIVE:  Still coughing up blood, but less amount.  Breathing better, but still discomfort with deep breath.  Denies wheeze, nausea, sinus congestion.  VITAL SIGNS: Temp:  [97.6 F (36.4 C)-102.7 F (39.3 C)] 97.6 F (36.4 C) (10/13 0730) Pulse Rate:  [92-111] 92 (10/13 0651) Resp:  [21-26] 26 (10/13 0651) BP: (107-123)/(64-73) 123/73 mmHg (10/13 0651) SpO2:  [99 %-100 %] 100 % (10/13 0651) Room air  INTAKE / OUTPUT: Intake/Output     10/12 0701 - 10/13 0700 10/13 0701 - 10/14 0700   P.O. 480    I.V. (mL/kg) 1150 (16.4)    Other     IV Piggyback 300    Total Intake(mL/kg) 1930 (27.5)    Urine (mL/kg/hr) 1500 (0.9)    Stool 1 (0)    Total Output 1501     Net +429          Urine Occurrence 1 x      PHYSICAL EXAMINATION: General: No distress Neuro:  Alert, normal strength HEENT:  No sinus tenderness Cardiovascular:  Regular, no  murmur Lungs:  B/l rhonchi, no wheeze Abdomen:  Soft, non tender Musculoskeletal:  No edema Skin:  No rashes  LABS:  CBC Recent Labs     10/13/12  0354  10/14/12  0817  10/15/12  0345  WBC  12.8*  10.6*  9.9  HGB  9.3*  10.3*  9.8*  HCT  28.2*  31.0*  29.4*  PLT  292  416*  457*   Coag's Recent Labs     10/13/12  1055  APTT  38*  INR  1.29   BMET Recent Labs     10/13/12  0354  10/14/12  0817  10/15/12  0345  NA  129*  131*  134*  K  3.2*  3.3*  3.3*  CL  96  96  99  CO2  24  23  25   BUN  7  8  7   CREATININE  0.57  0.44*  0.43*  GLUCOSE  94  101*  135*   Electrolytes Recent Labs     10/13/12  0354  10/14/12  0817  10/15/12  0345  CALCIUM  8.4  8.6  8.3*  MG  1.9   --    --   PHOS  3.0   --    --    Imaging Ct Chest Wo Contrast  10/13/2012   CLINICAL DATA:  Patient having more  hemoptysis. Patient on antibiotics for pneumonia.  EXAM: CT CHEST WITHOUT CONTRAST  TECHNIQUE: Multidetector CT imaging of the chest was performed following the standard protocol without IV contrast.  COMPARISON:  Chest radiograph, 10/10/2012  FINDINGS: There is extensive consolidation throughout much of the left lower lobe, the lingula of the left upper lobe and in the patchy distribution throughout the right upper lobe and right middle lobe with smaller areas in the right lower lobe. Two mass like focal opacities are noted laterally in the right lower lobe, largest measuring 2.6 cm in greatest transverse dimension. These are likely focal areas of consolidation. There are small bilateral pleural effusions. Pleural fluid tracks along the obliques fissure on the left. There is some left upper lobe dependent atelectasis adjacent to the pleural fluid.  There is mediastinal adenopathy. Reference measurements were made of several. There is a prevascular lymph node measuring 10 mm in short axis, a right peritracheal lymph node measuring 11 mm in short axis and a right subcarinal lymph node  measuring 8 mm in short axis. These are all likely reactive.  The heart is normal in size. There are moderate coronary artery calcifications. No neck base or axillary adenopathy is seen.  Limited evaluation of the upper abdomen shows evidence of fatty infiltration of the liver but is otherwise unremarkable.  There are degenerative changes of the visualized spine. No osteoblastic or osteolytic lesions.  IMPRESSION: 1. Extensive pneumonia, seen throughout both lungs but most evident in the left lower lobe and in the lingula of the left upper lobe, but also significant in the right upper lobe. There are associated pleural effusions, larger on the left, where it mostly accumulates in the mid to upper hemi thorax and along the left superior oblique fissure. 2. Reactive adenopathy. 3. Multifocal pneumonia appears worsened when compared to the prior chest radiograph.   Electronically Signed   By: Amie Portland M.D.   On: 10/13/2012 12:00    ASSESSMENT / PLAN: A: 42 yo male smoker with HCAP in setting of ETOH and recent hospital admission.  Has extensive b/l infiltrates.  Developed hemoptysis in setting of this.  Blood cx positive for GNR x 2, and urine pneumococcal antigen positive. P: -continue Abx per primary team -f/u CXR intermittently to document clearing of infection >> will repeat PA/lateral film 10/14 -f/u Quantiferon gold assay from 10/11  -f/u blood cx results from 10/08 -needs to stop smoking  Okay to transfer to medical floor bed.  Coralyn Helling, MD [facility] Pulmonary/Critical Care 10/15/2012, 9:10 AM Pager:  [phone] After 3pm call: [phone]

## 2012-10-15 NOTE — Progress Notes (Signed)
[facility] PROGRESS NOTE  Eugene Jackson WUJ:[id number] DOB: Apr 12, 1970 DOA: 10/10/2012 PCP: Nonnie Done., MD  Assessment/Plan: Acute hypoxic respiratory failure - secondary to PNA. Patient's breathing is stable this morning - tachycardia improved, HR in the 90s today. - transfer to telemetry GNR bacteremia  - on Meropenem. No history of drug resistant organisms, awaiting speciation.  HCAP (healthcare-associated pneumonia), SIRS - given fever, leukocytosis, tachycardia, elevated lactic acid, with evidence of PNA.  - WBC improving, stable. Normal this morning.  - CT 10/11 with worsening multifocal pneumonia. Pulmonary consulted, appreciate input. - still coughing up blood tinged sputum, stable Alcohol withdrawal  - improved significantly Hyponatremia - stable. Continue fluid restriction.  Anemia of chronic disease - stable Moderate protein-calorie malnutrition  - secondary to acute on chronic illness, alcohol abuse  - nutrition consulted 10/13 Sinus tachycardia - due to active infection, ETOH withdrawal, fever. Improving.  - HR in the 90s this morning.  DVT Prophylaxis - SCDs  Code Status: Full Family Communication: Pt in the room  Disposition Plan: Floor transfer today  Consultants:  Pulmonology  Procedures:  none   Antibiotics Vancomycin 10/8 >> Cefepime 10/8 >>10/11 Meropenem 10/11 >>  HPI/Subjective: - feels better this morning, off oxygen  Objective: Filed Vitals:   10/15/12 0210 10/15/12 0400 10/15/12 0651 10/15/12 0730  BP: 113/64  123/73   Pulse: 102  92   Temp: 100.5 F (38.1 C) 100.9 F (38.3 C)  97.6 F (36.4 C)  TempSrc:  Oral  Oral  Resp: 21  26   Height:      Weight:      SpO2: 99%  100%     Intake/Output Summary (Last 24 hours) at 10/15/12 1017 Last data filed at 10/15/12 0954  Gross per 24 hour  Intake   1950 ml  Output   1526 ml  Net    424 ml   Filed Weights   10/10/12 2100 10/12/12 0400 10/13/12 0400  Weight: 63.9  kg (140 lb 14 oz) 71.6 kg (157 lb 13.6 oz) 70.2 kg (154 lb 12.2 oz)   Exam:  General:  NAD, looking more comfortable  Cardiovascular: regular rate and rhythm, without MRG  Respiratory: good air movement, mild decrease breath sounds on left, rhonchi present  Abdomen: soft, not tender to palpation, positive bowel sounds  MSK: no peripheral edema  Neuro: CN 2-12 grossly intact, MS 5/5 in all 4  Data Reviewed: Basic Metabolic Panel:  Recent Labs Lab 10/10/12 2157 10/11/12 0332 10/12/12 0515 10/13/12 0354 10/14/12 0817 10/15/12 0345  NA  --  133* 130* 129* 131* 134*  K  --  3.7 3.0* 3.2* 3.3* 3.3*  CL  --  99 97 96 96 99  CO2  --  22 24 24 23 25   GLUCOSE  --  100* 95 94 101* 135*  BUN  --  10 8 7 8 7   CREATININE  --  0.52 0.50 0.57 0.44* 0.43*  CALCIUM  --  8.9 8.1* 8.4 8.6 8.3*  MG 1.5  --  1.9 1.9  --   --   PHOS 3.4  --  2.3 3.0  --   --    CBC:  Recent Labs Lab 10/10/12 1646 10/11/12 0332 10/12/12 0515 10/13/12 0354 10/14/12 0817 10/15/12 0345  WBC 19.1* 18.3* 12.2* 12.8* 10.6* 9.9  NEUTROABS 16.2*  --   --   --   --   --   HGB 14.4 13.0 10.2* 9.3* 10.3* 9.8*  HCT 43.4 40.0 31.2*  28.2* 31.0* 29.4*  MCV 107.2* 109.0* 105.8* 105.6* 104.7* 103.5*  PLT 291 292 266 292 416* 457*    Recent Results (from the past 240 hour(s))  CULTURE, BLOOD (ROUTINE X 2)     Status: None   Collection Time    10/10/12  6:02 PM      Result Value Range Status   Specimen Description BLOOD RIGHT ANTECUBITAL   Final   Special Requests BOTTLES DRAWN AEROBIC AND ANAEROBIC 5CC   Final   Culture  Setup Time     Final   Value: 10/11/2012 02:20     Performed at [facility]   Culture     Final   Value: GRAM NEGATIVE RODS     Note: Gram Stain Report Called to,Read Back By and Verified With: MEGAN STOCKS ON 10/11/2012 AT 10:28P BY WILEJ     Performed at [facility]   Report Status PENDING   Incomplete  CULTURE, BLOOD (ROUTINE X 2)     Status: None   Collection Time     10/10/12  6:02 PM      Result Value Range Status   Specimen Description BLOOD LEFT ANTECUBITAL   Final   Special Requests BOTTLES DRAWN AEROBIC AND ANAEROBIC 5CC   Final   Culture  Setup Time     Final   Value: 10/11/2012 02:21     Performed at [facility]   Culture     Final   Value: GRAM NEGATIVE RODS     Note: Gram Stain Report Called to,Read Back By and Verified With: MEGAN STOCKS ON 10/11/2012 AT 10:28P BY [employer]     Performed at [facility]   Report Status PENDING   Incomplete  CULTURE, RESPIRATORY (NON-EXPECTORATED)     Status: None   Collection Time    10/10/12  9:46 PM      Result Value Range Status   Specimen Description SPUTUM   Final   Special Requests NONE   Final   Gram Stain     Final   Value: ABUNDANT WBC PRESENT, PREDOMINANTLY PMN     FEW SQUAMOUS EPITHELIAL CELLS PRESENT     ABUNDANT GRAM POSITIVE RODS     ABUNDANT GRAM POSITIVE COCCI IN PAIRS     FEW GRAM NEGATIVE RODS   Culture     Final   Value: NORMAL OROPHARYNGEAL FLORA     Performed at [facility]   Report Status 10/13/2012 FINAL   Final  MRSA PCR SCREENING     Status: None   Collection Time    10/10/12  9:47 PM      Result Value Range Status   MRSA by PCR NEGATIVE  NEGATIVE Final   Comment:            The GeneXpert MRSA Assay (FDA     approved for NASAL specimens     only), is one component of a     comprehensive MRSA colonization     surveillance program. It is not     intended to diagnose MRSA     infection nor to guide or     monitor treatment for     MRSA infections.     Studies: Ct Chest Wo Contrast  10/13/2012   CLINICAL DATA:  Patient having more hemoptysis. Patient on antibiotics for pneumonia.  EXAM: CT CHEST WITHOUT CONTRAST  TECHNIQUE: Multidetector CT imaging of the chest was performed following the standard protocol without IV contrast.  COMPARISON:  Chest radiograph, 10/10/2012  FINDINGS: There is extensive consolidation throughout much of the left  lower lobe, the lingula of the left upper lobe and in the patchy distribution throughout the right upper lobe and right middle lobe with smaller areas in the right lower lobe. Two mass like focal opacities are noted laterally in the right lower lobe, largest measuring 2.6 cm in greatest transverse dimension. These are likely focal areas of consolidation. There are small bilateral pleural effusions. Pleural fluid tracks along the obliques fissure on the left. There is some left upper lobe dependent atelectasis adjacent to the pleural fluid.  There is mediastinal adenopathy. Reference measurements were made of several. There is a prevascular lymph node measuring 10 mm in short axis, a right peritracheal lymph node measuring 11 mm in short axis and a right subcarinal lymph node measuring 8 mm in short axis. These are all likely reactive.  The heart is normal in size. There are moderate coronary artery calcifications. No neck base or axillary adenopathy is seen.  Limited evaluation of the upper abdomen shows evidence of fatty infiltration of the liver but is otherwise unremarkable.  There are degenerative changes of the visualized spine. No osteoblastic or osteolytic lesions.  IMPRESSION: 1. Extensive pneumonia, seen throughout both lungs but most evident in the left lower lobe and in the lingula of the left upper lobe, but also significant in the right upper lobe. There are associated pleural effusions, larger on the left, where it mostly accumulates in the mid to upper hemi thorax and along the left superior oblique fissure. 2. Reactive adenopathy. 3. Multifocal pneumonia appears worsened when compared to the prior chest radiograph.   Electronically Signed   By: Amie Portland M.D.   On: 10/13/2012 12:00    Scheduled Meds: . famotidine  20 mg Oral QHS  . folic acid  1 mg Oral Daily  . meropenem (MERREM) IV  1 g Intravenous Q8H  . multivitamin with minerals  1 tablet Oral Daily  . thiamine  500 mg Oral Daily    Continuous Infusions: . sodium chloride 50 mL/hr at 10/14/12 1718    Principal Problem:   HCAP (healthcare-associated pneumonia) Active Problems:   Alcohol abuse   Hyponatremia   Anemia of chronic disease   Abnormal liver function tests   Moderate protein-calorie malnutrition   Acute respiratory failure   Bacteremia   Hemoptysis   Tobacco abuse  Time spent: 87  Pamella Pert, MD [facility] Pager [phone]. If 7 PM - 7 AM, please contact night-coverage at [email or web address], password [facility] 10/15/2012, 10:17 AM  LOS: 5 days

## 2012-10-15 NOTE — Progress Notes (Signed)
INITIAL NUTRITION ASSESSMENT  DOCUMENTATION CODES Per approved criteria  -Not Applicable   INTERVENTION: Provide Magic Cup TID Encourage PO intake >75% of 3 meals daily  NUTRITION DIAGNOSIS: Inadequate oral intake related to acute illness/poor appetite as evidenced by pt's report of eating 1 meal or less daily for the past month.   Goal: Pt to meet >/= 90% of their estimated nutrition needs   Monitor:  PO intake Weight Labs  Reason for Assessment: Consult  42 y.o. male  Admitting Dx: HCAP (healthcare-associated pneumonia)  ASSESSMENT: 42 yo male with history of alcohol abuse and seizures, HTN who presents to [facility] ED with main concern of progressively worsening shortness of breath, initially present with exertion and now at rest, associated with subjective fevers, chills, nausea and poor oral intake, productive cough of clear to yellow sputum. In ED, pt found to be hypoxic with oxygen saturation in 80's on RA, CXR consistent with PNA and [facility] asked to admit to SDU for further management.   Per MD note pt has moderate protein-calorie malnutrition secondary to acute on chronic illness and alcohol abuse. Pt reports having a fair appetite and eating most of his breakfast this morning. He states that for the past month he has not had much of an appetite due to illness and he has been eating one meal or less daily but, pt also reports eating better than usual for the past month and gaining weight.   Nutrition Focused Physical Exam:  Subcutaneous Fat:  Orbital Region: wnl Upper Arm Region: mild wasting Thoracic and Lumbar Region: wnl  Muscle:  Temple Region: wnl Clavicle Bone Region: wnl Clavicle and Acromion Bone Region: wnl Scapular Bone Region: NA Dorsal Hand: mild wasting Patellar Region: mild wasting Anterior Thigh Region: mild wasting Posterior Calf Region: NA  Edema: none   Height: Ht Readings from Last 1 Encounters:  10/10/12 5' 6.93" (1.7 m)    Weight: Wt  Readings from Last 1 Encounters:  10/13/12 154 lb 12.2 oz (70.2 kg)    Ideal Body Weight: 148 lbs  % Ideal Body Weight: 104%  Wt Readings from Last 10 Encounters:  10/13/12 154 lb 12.2 oz (70.2 kg)  09/06/12 140 lb 14 oz (63.9 kg)    Usual Body Weight: 150 lbs  % Usual Body Weight: 103%  BMI:  Body mass index is 24.29 kg/(m^2).  Estimated Nutritional Needs: Kcal: 1900-2100 Protein: 85-98 grams Fluid: 2.2 L/day  Skin: WDL  Diet Order: General  EDUCATION NEEDS: -No education needs identified at this time   Intake/Output Summary (Last 24 hours) at 10/15/12 1103 Last data filed at 10/15/12 0954  Gross per 24 hour  Intake   1830 ml  Output   1501 ml  Net    329 ml    Last BM: 10/13   Labs:   Recent Labs Lab 10/10/12 2157  10/12/12 0515 10/13/12 0354 10/14/12 0817 10/15/12 0345  NA  --   < > 130* 129* 131* 134*  K  --   < > 3.0* 3.2* 3.3* 3.3*  CL  --   < > 97 96 96 99  CO2  --   < > 24 24 23 25   BUN  --   < > 8 7 8 7   CREATININE  --   < > 0.50 0.57 0.44* 0.43*  CALCIUM  --   < > 8.1* 8.4 8.6 8.3*  MG 1.5  --  1.9 1.9  --   --   PHOS 3.4  --  2.3 3.0  --   --   GLUCOSE  --   < > 95 94 101* 135*  < > = values in this interval not displayed.  CBG (last 3)  No results found for this basename: GLUCAP,  in the last 72 hours  Scheduled Meds: . famotidine  20 mg Oral QHS  . folic acid  1 mg Oral Daily  . meropenem (MERREM) IV  1 g Intravenous Q8H  . multivitamin with minerals  1 tablet Oral Daily  . potassium chloride  40 mEq Oral Once  . thiamine  500 mg Oral Daily    Continuous Infusions: . sodium chloride 50 mL/hr at 10/14/12 1718    Past Medical History  Diagnosis Date  . Hypertension   . Broken femur     right  . PONV (postoperative nausea and vomiting)     Past Surgical History  Procedure Laterality Date  . Wrist surgery    . Joint replacement      bilateral hip replacement    Ian Malkin RD, LDN Inpatient Clinical  Dietitian Pager: [phone] After Hours Pager: [phone]

## 2012-10-16 ENCOUNTER — Inpatient Hospital Stay (HOSPITAL_COMMUNITY): Payer: No Typology Code available for payment source

## 2012-10-16 LAB — BASIC METABOLIC PANEL
CO2: 23 mEq/L (ref 19–32)
Calcium: 8.8 mg/dL (ref 8.4–10.5)
Creatinine, Ser: 0.42 mg/dL — ABNORMAL LOW (ref 0.50–1.35)
GFR calc Af Amer: >90 mL/min (ref >90)
Potassium: 3.7 mEq/L (ref 3.5–5.1)
Sodium: 133 mEq/L — ABNORMAL LOW (ref 135–145)

## 2012-10-16 LAB — CBC
HCT: 30 % — ABNORMAL LOW (ref 39.0–52.0)
MCH: 34 pg (ref 26.0–34.0)
MCHC: 33 g/dL (ref 30.0–36.0)
MCV: 103.1 fL — ABNORMAL HIGH (ref 78.0–100.0)
Platelets: 541 10*3/uL — ABNORMAL HIGH (ref 150–400)
RBC: 2.91 MIL/uL — ABNORMAL LOW (ref 4.22–5.81)
RDW: 16.2 % — ABNORMAL HIGH (ref 11.5–15.5)

## 2012-10-16 LAB — PHOSPHORUS: Phosphorus: 3.7 mg/dL (ref 2.3–4.6)

## 2012-10-16 LAB — MAGNESIUM: Magnesium: 1.9 mg/dL (ref 1.5–2.5)

## 2012-10-16 NOTE — Progress Notes (Signed)
PULMONARY  / CRITICAL CARE MEDICINE  Name: Eugene Jackson MRN: [id number] DOB: 01/19/1970    ADMISSION DATE:  10/10/2012 CONSULTATION DATE:  10/13/2012  REFERRING MD : Elvera Lennox  CHIEF COMPLAINT: Cough  BRIEF PATIENT DESCRIPTION:  42 yo male smoker presented with dyspnea, fever, chills, nausea and productive cough.  Noted to have acute hypoxic respiratory failure and hyponatremia from HCAP and ETOH.  Developed hemoptysis 10/11 and PCCM consulted.  SIGNIFICANT EVENTS: 10/08 Admit 10/11 Hemoptysis >> PCCM consulted 10-14 continued hemoptysis STUDIES:  10/11 CT chest >> LLL, lingular, Lt upper lobe consolidation; patchy RUL and RML infiltrate; two lesions RLL mass vs consolidation; small b/l effusions 10/12 Echo >> EF 55%, grade 1 diastolic dysfx  LINES / TUBES: PIV  CULTURES: Blood 10/08 >> GNR >>  Sputum 10/08 >> oral flora HIV 10/08 >> Non reactive Pneumococcal Ag 10/09 >> POSITIVE  Legionella Ag 10/09 >> Negative Quantiferon gold 10/11 >>   ANTIBIOTICS: Cefepime 10/08 >> 10/10 Zosyn 10/08 >> 10/08  Vancomycin 10/08 >> 10/10 Meropenem 10/11 >>   SUBJECTIVE:  Still coughing up blood, but less amount. Complains about having hiccups. Marland Kitchen  VITAL SIGNS: Temp:  [98.3 F (36.8 C)-99.5 F (37.5 C)] 98.3 F (36.8 C) (10/14 0601) Pulse Rate:  [84-108] 84 (10/14 0601) Resp:  [20-22] 22 (10/14 0601) BP: (116-127)/(70-75) 118/70 mmHg (10/14 0601) SpO2:  [94 %-96 %] 96 % (10/14 0601) Weight:  [141 lb 1.5 oz (64 kg)] 141 lb 1.5 oz (64 kg) (10/13 1213) Room air  INTAKE / OUTPUT: Intake/Output     10/13 0701 - 10/14 0700 10/14 0701 - 10/15 0700   P.O. 240 200   I.V. (mL/kg) 1153.3 (18)    IV Piggyback 100    Total Intake(mL/kg) 1493.3 (23.3) 200 (3.1)   Urine (mL/kg/hr) 1100 (0.7)    Stool     Total Output 1100     Net +393.3 +200          PHYSICAL EXAMINATION: General: No distress,cointinued hempotysis , old dark blodd. Neuro:  Alert, normal strength, strange  affect HEENT:  No sinus tenderness Cardiovascular:  Regular, no murmur Lungs:  B/l rhonchi, no wheeze Abdomen:  Soft, non tender Musculoskeletal:  No edema Skin:  No rashes  LABS:  CBC Recent Labs     10/14/12  0817  10/15/12  0345  10/16/12  0510  WBC  10.6*  9.9  11.9*  HGB  10.3*  9.8*  9.9*  HCT  31.0*  29.4*  30.0*  PLT  416*  457*  541*   Coag's Recent Labs     10/13/12  1055  APTT  38*  INR  1.29   BMET Recent Labs     10/14/12  0817  10/15/12  0345  10/16/12  0510  NA  131*  134*  133*  K  3.3*  3.3*  3.7  CL  96  99  100  CO2  23  25  23   BUN  8  7  7   CREATININE  0.44*  0.43*  0.42*  GLUCOSE  101*  135*  98   Electrolytes Recent Labs     10/14/12  0817  10/15/12  0345  10/16/12  0510  CALCIUM  8.6  8.3*  8.8  MG   --    --   1.9  PHOS   --    --   3.7   Imaging Dg Chest 2 View  10/16/2012   CLINICAL DATA:  Followup  pneumonia, cough, spitting up blood  EXAM: CHEST  2 VIEW  COMPARISON:  CT chest of 10/13/2012, as well as chest x-ray of 10 8 and 08/31/2012  FINDINGS: Interval worsening of pneumonia particularly at the left lung base involving the left lower lobe and lingula, now also with patchy opacity throughout the right lung consistent with multifocal pneumonia. Small pleural effusions are present left greater than right. Some cavitation may be a present within the lingula better seen on the lateral view. Mediastinal contours are stable and heart size is stable.  IMPRESSION: Worsening of pneumonia left greater than right with possible cavitation in the region of the lingula and or left lower lobe. Small effusions.   Electronically Signed   By: Dwyane Dee M.D.   On: 10/16/2012 08:03   Principal Problem:   HCAP (healthcare-associated pneumonia) Active Problems:   Alcohol abuse   Hyponatremia   Anemia of chronic disease   Abnormal liver function tests   Moderate protein-calorie malnutrition   Acute respiratory failure   Bacteremia    Hemoptysis   Tobacco abuse  ASSESSMENT / PLAN: A: 42 yo male smoker with HCAP in setting of ETOH and recent hospital admission.  Has extensive b/l infiltrates.  Developed hemoptysis in setting of this.  Blood cx positive for GNR x 2, and urine pneumococcal antigen positive.  Quantiferon gold assay from 10/11  neg P: -continue cefepime- can stop vanc - repeat PA/lateral film 10/14, shows worsening pattern. Recheck 10-15 - Await ID of GNR in blood- may have to call lab -needs to stop smoking -PCCM to continue to follow.   Cyril Mourning MD. Tonny Bollman. [facility] Pager [phone] If no response call [phone]   10/16/2012, 10:35 AM

## 2012-10-16 NOTE — Progress Notes (Signed)
ANTIBIOTIC CONSULT NOTE - Follow up  Pharmacy Consult for meropenem Indication: GNR bacteremia  No Known Allergies  Patient Measurements: Height: 5\' 9"  (175.3 cm) Weight: 141 lb 1.5 oz (64 kg) IBW/kg (Calculated) : 70.7  Vital Signs: Temp: 98.8 F (37.1 C) (10/14 1305) Temp src: Oral (10/14 1305) BP: 119/65 mmHg (10/14 1305) Pulse Rate: 97 (10/14 1305) Intake/Output from previous day: 10/13 0701 - 10/14 0700 In: 1493.3 [P.O.:240; I.V.:1153.3; IV Piggyback:100] Out: 1100 [Urine:1100] Intake/Output from this shift: Total I/O In: 440 [P.O.:440] Out: 375 [Urine:375]  Labs:  Recent Labs  10/14/12 0817 10/15/12 0345 10/16/12 0510  WBC 10.6* 9.9 11.9*  HGB 10.3* 9.8* 9.9*  PLT 416* 457* 541*  CREATININE 0.44* 0.43* 0.42*   Estimated Creatinine Clearance: 106.7 ml/min (by C-G formula based on Cr of 0.42).  Microbiology: 10/8 blood x2: 2/2 GNR 10/8 MRSA pcr: negative 10/9 sputum: nml oropharyngeal flora- final 10/9 S. Pneumonia Ag: POSITIVE 10/9 Legionella Ag: negative  Assessment: 65 yoM admitted 10/8 with progressively worsening SOB, associated with fevers, chills, nausea, and poor PO intake. Pt was recently admitted 8/28-9/5 for acute alcohol intoxication and seizures. Pt is well known to Pharmacy for antibiotic consults, and Pharmacy has been consulted to dose meropenem for GNR bacteremia.   Antibiotics changed on 10/11 to include addition of anaerobic coverage in this pt with a Hx of EtOH abuse, Primaxin has not been added due to a Hx of seizures per CCM MD note 10/11. Pt noted to have worsening hemoptysis this AM. Tm afebrile WBC elevated but improved at 11.9 Cultures as above - pending identification of GNR in blood SCr=0.4 with stable CrCl ~ 100 ml/min  Goal of Therapy:  Appropriate abx dosing, eradication of infection.   Plan:   Continue meropenem 1g IV q8  Follow-up clinical course, culture results, renal function  Follow-up antibiotic de-escalation  and length of therapy  Thank you for the consult.  Lynann Beaver PharmD, BCPS Pager [phone] 10/16/2012 2:57 PM

## 2012-10-16 NOTE — Progress Notes (Signed)
[facility] PROGRESS NOTE  Eugene Jackson ION:[id number] DOB: 01/18/1970 DOA: 10/10/2012 PCP: Nonnie Done., MD  Brief history  42 yo M with heavy ETOH use, seizures in the setting of ETOH withdrawal, came in with SOB, fever/chills, found to have PNA. Treated as HCAP since has been hospitalized recently.   Assessment/Plan: Acute hypoxic respiratory failure - secondary to PNA. Patient's breathing is stable this morning, and somewhat improving, has been on the floor since 10/13. Now off O2, will ambulate. Asked for PT consult.  GNR bacteremia  - on Meropenem. No history of drug resistant organisms, awaiting speciation.  - called lab and they are working on it, looks like it is an anaerobe. Continue current regimen, clinically responding nicely.  HCAP (healthcare-associated pneumonia), SIRS - given fever, leukocytosis, tachycardia, elevated lactic acid, with evidence of PNA.  - CT 10/11 with worsening multifocal pneumonia. Pulmonary following, appreciate input. - still coughing up blood tinged sputum, stable - continue meropenem, hopefully will see speciation soon.  Alcohol withdrawal  - improved significantly Hyponatremia - stable. Continue fluid restriction.  Anemia of chronic disease - stable Moderate protein-calorie malnutrition  - secondary to acute on chronic illness, alcohol abuse  - nutrition consulted 10/13 Sinus tachycardia - due to active infection, ETOH withdrawal, fever. Improving.  - improved with treatment  DVT Prophylaxis - SCDs  Code Status: Full Family Communication: Pt in the room  Disposition Plan: home when ready, hopefully 2-3 days  Consultants:  Pulmonology  Procedures:  none   Antibiotics Vancomycin 10/8 >> Cefepime 10/8 >>10/11 Meropenem 10/11 >>  HPI/Subjective: - feels better this morning, off oxygen  Objective: Filed Vitals:   10/15/12 0730 10/15/12 1213 10/15/12 2029 10/16/12 0601  BP:  127/75 116/75 118/70  Pulse:  108 97 84   Temp: 97.6 F (36.4 C) 99.4 F (37.4 C) 99.5 F (37.5 C) 98.3 F (36.8 C)  TempSrc: Oral Oral Oral Oral  Resp:  20 20 22   Height:  5\' 9"  (1.753 m)    Weight:  64 kg (141 lb 1.5 oz)    SpO2:  94% 95% 96%    Intake/Output Summary (Last 24 hours) at 10/16/12 0919 Last data filed at 10/16/12 0604  Gross per 24 hour  Intake 1493.33 ml  Output   1100 ml  Net 393.33 ml   Filed Weights   10/12/12 0400 10/13/12 0400 10/15/12 1213  Weight: 71.6 kg (157 lb 13.6 oz) 70.2 kg (154 lb 12.2 oz) 64 kg (141 lb 1.5 oz)   Exam:  General:  NAD  Cardiovascular: regular rate and rhythm, without MRG  Respiratory: good air movement, mild decrease breath sounds on left, rhonchi present  Abdomen: soft, not tender to palpation, positive bowel sounds  MSK: no peripheral edema  Neuro: CN 2-12 grossly intact, MS 5/5 in all 4  Data Reviewed: Basic Metabolic Panel:  Recent Labs Lab 10/10/12 2157  10/12/12 0515 10/13/12 0354 10/14/12 0817 10/15/12 0345 10/16/12 0510  NA  --   < > 130* 129* 131* 134* 133*  K  --   < > 3.0* 3.2* 3.3* 3.3* 3.7  CL  --   < > 97 96 96 99 100  CO2  --   < > 24 24 23 25 23   GLUCOSE  --   < > 95 94 101* 135* 98  BUN  --   < > 8 7 8 7 7   CREATININE  --   < > 0.50 0.57 0.44* 0.43* 0.42*  CALCIUM  --   < >  8.1* 8.4 8.6 8.3* 8.8  MG 1.5  --  1.9 1.9  --   --  1.9  PHOS 3.4  --  2.3 3.0  --   --  3.7  < > = values in this interval not displayed. CBC:  Recent Labs Lab 10/10/12 1646  10/12/12 0515 10/13/12 0354 10/14/12 0817 10/15/12 0345 10/16/12 0510  WBC 19.1*  < > 12.2* 12.8* 10.6* 9.9 11.9*  NEUTROABS 16.2*  --   --   --   --   --   --   HGB 14.4  < > 10.2* 9.3* 10.3* 9.8* 9.9*  HCT 43.4  < > 31.2* 28.2* 31.0* 29.4* 30.0*  MCV 107.2*  < > 105.8* 105.6* 104.7* 103.5* 103.1*  PLT 291  < > 266 292 416* 457* 541*  < > = values in this interval not displayed.  Recent Results (from the past 240 hour(s))  CULTURE, BLOOD (ROUTINE X 2)     Status: None    Collection Time    10/10/12  6:02 PM      Result Value Range Status   Specimen Description BLOOD RIGHT ANTECUBITAL   Final   Special Requests BOTTLES DRAWN AEROBIC AND ANAEROBIC 5CC   Final   Culture  Setup Time     Final   Value: 10/11/2012 02:20     Performed at [facility]   Culture     Final   Value: GRAM NEGATIVE RODS     Note: Gram Stain Report Called to,Read Back By and Verified With: MEGAN STOCKS ON 10/11/2012 AT 10:28P BY WILEJ     Performed at [facility]   Report Status PENDING   Incomplete  CULTURE, BLOOD (ROUTINE X 2)     Status: None   Collection Time    10/10/12  6:02 PM      Result Value Range Status   Specimen Description BLOOD LEFT ANTECUBITAL   Final   Special Requests BOTTLES DRAWN AEROBIC AND ANAEROBIC 5CC   Final   Culture  Setup Time     Final   Value: 10/11/2012 02:21     Performed at [facility]   Culture     Final   Value: GRAM NEGATIVE RODS     Note: Gram Stain Report Called to,Read Back By and Verified With: MEGAN STOCKS ON 10/11/2012 AT 10:28P BY WILEJ     Performed at [facility]   Report Status PENDING   Incomplete  CULTURE, RESPIRATORY (NON-EXPECTORATED)     Status: None   Collection Time    10/10/12  9:46 PM      Result Value Range Status   Specimen Description SPUTUM   Final   Special Requests NONE   Final   Gram Stain     Final   Value: ABUNDANT WBC PRESENT, PREDOMINANTLY PMN     FEW SQUAMOUS EPITHELIAL CELLS PRESENT     ABUNDANT GRAM POSITIVE RODS     ABUNDANT GRAM POSITIVE COCCI IN PAIRS     FEW GRAM NEGATIVE RODS   Culture     Final   Value: NORMAL OROPHARYNGEAL FLORA     Performed at [facility]   Report Status 10/13/2012 FINAL   Final  MRSA PCR SCREENING     Status: None   Collection Time    10/10/12  9:47 PM      Result Value Range Status   MRSA by PCR NEGATIVE  NEGATIVE Final   Comment:  The GeneXpert MRSA Assay (FDA     approved for NASAL specimens     only), is one  component of a     comprehensive MRSA colonization     surveillance program. It is not     intended to diagnose MRSA     infection nor to guide or     monitor treatment for     MRSA infections.   Studies: Dg Chest 2 View  10/16/2012   CLINICAL DATA:  Followup pneumonia, cough, spitting up blood  EXAM: CHEST  2 VIEW  COMPARISON:  CT chest of 10/13/2012, as well as chest x-ray of 10 8 and 08/31/2012  FINDINGS: Interval worsening of pneumonia particularly at the left lung base involving the left lower lobe and lingula, now also with patchy opacity throughout the right lung consistent with multifocal pneumonia. Small pleural effusions are present left greater than right. Some cavitation may be a present within the lingula better seen on the lateral view. Mediastinal contours are stable and heart size is stable.  IMPRESSION: Worsening of pneumonia left greater than right with possible cavitation in the region of the lingula and or left lower lobe. Small effusions.   Electronically Signed   By: Dwyane Dee M.D.   On: 10/16/2012 08:03    Scheduled Meds: . famotidine  20 mg Oral QHS  . folic acid  1 mg Oral Daily  . meropenem (MERREM) IV  1 g Intravenous Q8H  . multivitamin with minerals  1 tablet Oral Daily  . thiamine  500 mg Oral Daily   Continuous Infusions: . sodium chloride 50 mL/hr at 10/15/12 1152   Principal Problem:   HCAP (healthcare-associated pneumonia) Active Problems:   Alcohol abuse   Hyponatremia   Anemia of chronic disease   Abnormal liver function tests   Moderate protein-calorie malnutrition   Acute respiratory failure   Bacteremia   Hemoptysis   Tobacco abuse  Time spent: 74  Pamella Pert, MD [facility] Pager [phone]. If 7 PM - 7 AM, please contact night-coverage at [email or web address], password [facility] 10/16/2012, 9:19 AM  LOS: 6 days

## 2012-10-17 ENCOUNTER — Inpatient Hospital Stay (HOSPITAL_COMMUNITY): Payer: No Typology Code available for payment source

## 2012-10-17 DIAGNOSIS — R042 Hemoptysis: Secondary | ICD-10-CM

## 2012-10-17 LAB — BASIC METABOLIC PANEL
BUN: 7 mg/dL (ref 6–23)
Chloride: 99 mEq/L (ref 96–112)
Creatinine, Ser: 0.42 mg/dL — ABNORMAL LOW (ref 0.50–1.35)
GFR calc Af Amer: >90 mL/min (ref >90)
Potassium: 3.8 mEq/L (ref 3.5–5.1)

## 2012-10-17 NOTE — Progress Notes (Signed)
Report received from L. Anderson, RN. No change from initial pm report. Will continue to follow the plan of care. 

## 2012-10-17 NOTE — Progress Notes (Signed)
PULMONARY  / CRITICAL CARE MEDICINE  Name: Eugene Jackson MRN: [id number] DOB: 05/28/1970    ADMISSION DATE:  10/10/2012 CONSULTATION DATE:  10/13/2012  REFERRING MD : Elvera Lennox  CHIEF COMPLAINT: Cough  BRIEF PATIENT DESCRIPTION:  42 yo male smoker presented with dyspnea, fever, chills, nausea and productive cough.  Noted to have acute hypoxic respiratory failure and hyponatremia from HCAP and ETOH.  Developed hemoptysis 10/11 and PCCM consulted.  SIGNIFICANT EVENTS: 10/08 Admit 10/11 Hemoptysis >> PCCM consulted 10-14 continued hemoptysis STUDIES:  10/11 CT chest >> LLL, lingular, Lt upper lobe consolidation; patchy RUL and RML infiltrate; two lesions RLL mass vs consolidation; small b/l effusions 10/12 Echo >> EF 55%, grade 1 diastolic dysfx  LINES / TUBES: PIV  CULTURES: Blood 10/08 >> Gram pos rods >> Sputum 10/08 >> oral flora HIV 10/08 >> Non reactive Pneumococcal Ag 10/09 >> POSITIVE  Legionella Ag 10/09 >> Negative Quantiferon gold 10/11 >>   ANTIBIOTICS: Cefepime 10/08 >> 10/10 Zosyn 10/08 >> 10/08  Vancomycin 10/08 >> 10/10 Meropenem 10/11 >>   SUBJECTIVE:  Still coughing up blood, but less amount. Complains about having hiccups. afebrile  VITAL SIGNS: Temp:  [98.5 F (36.9 C)-99.6 F (37.6 C)] 98.5 F (36.9 C) (10/15 0532) Pulse Rate:  [91-97] 91 (10/15 0532) Resp:  [20-28] 20 (10/15 0532) BP: (109-119)/(59-70) 115/70 mmHg (10/15 0532) SpO2:  [95 %-100 %] 95 % (10/15 0532) Room air  INTAKE / OUTPUT: Intake/Output     10/14 0701 - 10/15 0700 10/15 0701 - 10/16 0700   P.O. 560 240   I.V. (mL/kg) 570 (8.9)    IV Piggyback 200    Total Intake(mL/kg) 1330 (20.8) 240 (3.8)   Urine (mL/kg/hr) 1250 (0.8)    Total Output 1250     Net +80 +240          PHYSICAL EXAMINATION: General: No distress,cointinued hempotysis , old dark blodd. Neuro:  Alert, normal strength, strange affect HEENT:  No sinus tenderness Cardiovascular:  Regular, no  murmur Lungs:  no rhonchi, no wheeze, decreased left base Abdomen:  Soft, non tender Musculoskeletal:  No edema Skin:  No rashes  LABS:  CBC Recent Labs     10/15/12  0345  10/16/12  0510  WBC  9.9  11.9*  HGB  9.8*  9.9*  HCT  29.4*  30.0*  PLT  457*  541*   Coag's No results found for this basename: APTT, INR,  in the last 72 hours BMET Recent Labs     10/15/12  0345  10/16/12  0510  10/17/12  0545  NA  134*  133*  133*  K  3.3*  3.7  3.8  CL  99  100  99  CO2  25  23  23   BUN  7  7  7   CREATININE  0.43*  0.42*  0.42*  GLUCOSE  135*  98  101*   Electrolytes Recent Labs     10/15/12  0345  10/16/12  0510  10/17/12  0545  CALCIUM  8.3*  8.8  8.9  MG   --   1.9   --   PHOS   --   3.7   --    Imaging Dg Chest 2 View  10/16/2012   CLINICAL DATA:  Followup pneumonia, cough, spitting up blood  EXAM: CHEST  2 VIEW  COMPARISON:  CT chest of 10/13/2012, as well as chest x-ray of 10 8 and 08/31/2012  FINDINGS: Interval worsening of pneumonia  particularly at the left lung base involving the left lower lobe and lingula, now also with patchy opacity throughout the right lung consistent with multifocal pneumonia. Small pleural effusions are present left greater than right. Some cavitation may be a present within the lingula better seen on the lateral view. Mediastinal contours are stable and heart size is stable.  IMPRESSION: Worsening of pneumonia left greater than right with possible cavitation in the region of the lingula and or left lower lobe. Small effusions.   Electronically Signed   By: Dwyane Dee M.D.   On: 10/16/2012 08:03   Principal Problem:   HCAP (healthcare-associated pneumonia) Active Problems:   Alcohol abuse   Hyponatremia   Anemia of chronic disease   Abnormal liver function tests   Moderate protein-calorie malnutrition   Acute respiratory failure   Bacteremia   Hemoptysis   Tobacco abuse  ASSESSMENT / PLAN: A: 42 yo male smoker with HCAP in  setting of ETOH and recent hospital admission.  Has extensive b/l infiltrates.  Developed hemoptysis in setting of this.  Blood cx positive for GNR x 2, and urine pneumococcal antigen positive.  Quantiferon gold assay from 10/11  neg P: -continue cefepime- can stop vanc -- Await ID of GPR in blood-called lab 10-15, lab having difficulty growing culture. -needs to stop smoking -XR decubs - if free flowing effusion, can consider tap -PCCM to continue to follow.  Cyril Mourning MD. Tonny Bollman. [facility] Pager [phone] If no response call [phone]   10/17/2012, 9:39 AM

## 2012-10-17 NOTE — Progress Notes (Signed)
Patient ID: Eugene Jackson, male   DOB: 23-Jan-1970, 42 y.o.   MRN: [id number]  [facility] PROGRESS NOTE  Eugene Jackson UVO:[id number] DOB: [birth year] DOA: 10/10/2012 PCP: Nonnie Done., MD  Brief narrative: 42 yo male smoker presented with dyspnea, fever, chills, nausea and productive cough on 10/10/2012. Noted to have acute hypoxic respiratory failure and hyponatremia from HCAP and ETOH. Developed hemoptysis 10/11 and PCCM consulted.  Principal Problem:   HCAP (healthcare-associated pneumonia) - CT 10/11 with worsening multifocal pneumonia. Pulmonary following, appreciate input.  - still coughing up blood tinged sputum but pt overall clinically stable - pneumococcal antigen + Active Problems:   Acute respiratory failure - secondary to HCAP - pt clinically stable this AM, appreciate PCCM input  - pt maintaining oxygen saturations at target range    Alcohol abuse - improved, continue to keep on CIWA protocol - cessation discussed in detail    Hyponatremia - stable overall ~133 - BMP in AM   Anemia of chronic disease - alcohol induced bone marrow damage - Hg/Hct stable and at baseline - CBC in AM   Moderate protein-calorie malnutrition - secondary to acute illness imposed on alcohol abuse and poor oral intake  - encouraged PO intake    Abnormal liver function tests - secondary to alcohol induced liver damage  - within normal limits 10/12/2012    Bacteremia - gram + rods noted on preliminary report - final result pending    Hemoptysis   Tobacco abuse  - cessation discussed in detail    GPR bacteremia  - on Meropenem. No history of drug resistant organisms, awaiting speciation.  - called lab and they are working on it, looks like it is an anaerobe. Continue current regimen, clinically responding nicely.    DVT Prophylaxis - SCDs  Consultants:  PCCM  Procedures/Studies: 10/11 CT chest >> LLL, lingular, Lt upper lobe consolidation; patchy RUL and RML  infiltrate; two lesions RLL mass vs consolidation; small b/l effusions  10/12 Echo >> EF 55%, grade 1 diastolic dysfx  10/15 CXR >> Stable left lung opacity consistent with pneumonia or atelectasis with associated pleural effusion.  10/14 CXR  >> Worsening of pneumonia left greater than right with possible cavitation in the region of the lingula and or left lower lobe.   CULTURES:  Blood 10/08 >> Gram pos rods >>  Sputum 10/08 >> oral flora  HIV 10/08 >> Non reactive  Pneumococcal Ag 10/09 >> POSITIVE  Legionella Ag 10/09 >> Negative  Quantiferon gold 10/11 >>  ANTIBIOTICS:  Cefepime 10/08 >> 10/10  Zosyn 10/08 >> 10/08  Vancomycin 10/08 >> 10/10  Meropenem 10/11 >>   Code Status: Full Family Communication: Pt at bedside Disposition Plan: Home when medically stable  HPI/Subjective: No events overnight.   Objective: Filed Vitals:   10/16/12 1305 10/16/12 2006 10/17/12 0532 10/17/12 1300  BP: 119/65 109/59 115/70 100/58  Pulse: 97 94 91 90  Temp: 98.8 F (37.1 C) 99.6 F (37.6 C) 98.5 F (36.9 C) 98.3 F (36.8 C)  TempSrc: Oral Oral Oral Oral  Resp: 22 28 20 24   Height:      Weight:      SpO2: 100% 95% 95% 90%    Intake/Output Summary (Last 24 hours) at 10/17/12 1607 Last data filed at 10/17/12 1300  Gross per 24 hour  Intake   1270 ml  Output   1325 ml  Net    -55 ml    Exam:   General:  Pt is alert,  follows commands appropriately, not in acute distress  Cardiovascular: Regular rate and rhythm, S1/S2, no murmurs, no rubs, no gallops  Respiratory: Clear to auscultation bilaterally, no wheezing, no crackles, diminished breath sounds at bases   Abdomen: Soft, non tender, non distended, bowel sounds present, no guarding  Extremities: No edema, pulses DP and PT palpable bilaterally  Neuro: Grossly nonfocal  Data Reviewed: Basic Metabolic Panel:  Recent Labs Lab 10/10/12 2157  10/12/12 0515 10/13/12 0354 10/14/12 0817 10/15/12 0345 10/16/12 0510  10/17/12 0545  NA  --   < > 130* 129* 131* 134* 133* 133*  K  --   < > 3.0* 3.2* 3.3* 3.3* 3.7 3.8  CL  --   < > 97 96 96 99 100 99  CO2  --   < > 24 24 23 25 23 23   GLUCOSE  --   < > 95 94 101* 135* 98 101*  BUN  --   < > 8 7 8 7 7 7   CREATININE  --   < > 0.50 0.57 0.44* 0.43* 0.42* 0.42*  CALCIUM  --   < > 8.1* 8.4 8.6 8.3* 8.8 8.9  MG 1.5  --  1.9 1.9  --   --  1.9  --   PHOS 3.4  --  2.3 3.0  --   --  3.7  --   < > = values in this interval not displayed. Liver Function Tests:  Recent Labs Lab 10/12/12 0515  AST 14  ALT 7  ALKPHOS 92  BILITOT 0.8  PROT 5.6*  ALBUMIN 2.0*   CBC:  Recent Labs Lab 10/10/12 1646  10/12/12 0515 10/13/12 0354 10/14/12 0817 10/15/12 0345 10/16/12 0510  WBC 19.1*  < > 12.2* 12.8* 10.6* 9.9 11.9*  NEUTROABS 16.2*  --   --   --   --   --   --   HGB 14.4  < > 10.2* 9.3* 10.3* 9.8* 9.9*  HCT 43.4  < > 31.2* 28.2* 31.0* 29.4* 30.0*  MCV 107.2*  < > 105.8* 105.6* 104.7* 103.5* 103.1*  PLT 291  < > 266 292 416* 457* 541*  < > = values in this interval not displayed.    Recent Results (from the past 240 hour(s))  CULTURE, BLOOD (ROUTINE X 2)     Status: None   Collection Time    10/10/12  6:02 PM      Result Value Range Status   Specimen Description BLOOD RIGHT ANTECUBITAL   Final   Special Requests BOTTLES DRAWN AEROBIC AND ANAEROBIC 5CC   Final   Culture  Setup Time     Final   Value: 10/11/2012 02:20     Performed at [facility]   Culture     Final   Value: GRAM POSITIVE RODS     Note: Anaerobic     Note: Gram Stain Report Called to,Read Back By and Verified With: MEGAN STOCKS ON 10/11/2012 AT 10:28P BY WILEJ     Performed at [facility]   Report Status PENDING   Incomplete  CULTURE, BLOOD (ROUTINE X 2)     Status: None   Collection Time    10/10/12  6:02 PM      Result Value Range Status   Specimen Description BLOOD LEFT ANTECUBITAL   Final   Special Requests BOTTLES DRAWN AEROBIC AND ANAEROBIC 5CC   Final    Culture  Setup Time     Final   Value: 10/11/2012  02:21     Performed at [facility]     Final   Value: GRAM POSITIVE RODS     Note: Anaerobic Gram Positive Rod     Note: Gram Stain Report Called to,Read Back By and Verified With: MEGAN STOCKS ON 10/11/2012 AT 10:28P BY [employer]     Performed at [facility]   Report Status PENDING   Incomplete  CULTURE, RESPIRATORY (NON-EXPECTORATED)     Status: None   Collection Time    10/10/12  9:46 PM      Result Value Range Status   Specimen Description SPUTUM   Final   Special Requests NONE   Final   Gram Stain     Final   Value: ABUNDANT WBC PRESENT, PREDOMINANTLY PMN     FEW SQUAMOUS EPITHELIAL CELLS PRESENT     ABUNDANT GRAM POSITIVE RODS     ABUNDANT GRAM POSITIVE COCCI IN PAIRS     FEW GRAM NEGATIVE RODS   Culture     Final   Value: NORMAL OROPHARYNGEAL FLORA     Performed at [facility]   Report Status 10/13/2012 FINAL   Final  MRSA PCR SCREENING     Status: None   Collection Time    10/10/12  9:47 PM      Result Value Range Status   MRSA by PCR NEGATIVE  NEGATIVE Final   Comment:            The GeneXpert MRSA Assay (FDA     approved for NASAL specimens     only), is one component of a     comprehensive MRSA colonization     surveillance program. It is not     intended to diagnose MRSA     infection nor to guide or     monitor treatment for     MRSA infections.     Scheduled Meds: . famotidine  20 mg Oral QHS  . folic acid  1 mg Oral Daily  . meropenem (MERREM) IV  1 g Intravenous Q8H  . multivitamin with minerals  1 tablet Oral Daily  . thiamine  500 mg Oral Daily   Continuous Infusions:   Debbora Presto, MD  [facility] Pager [phone]  If 7PM-7AM, please contact night-coverage [email or web address] Password [email or web address] 10/17/2012, 4:07 PM   LOS: 7 days

## 2012-10-17 NOTE — Evaluation (Signed)
Physical Therapy Evaluation Patient Details Name: Eugene Jackson MRN: [id number] DOB: 08/22/1970 Today's Date: 10/17/2012 Time: 0912-0938 PT Time Calculation (min): 26 min  PT Assessment / Plan / Recommendation History of Present Illness  Pt is 42 yo male with history of alcohol abuse and seizures, HTN who presents to [facility] ED with main concern of progressively worsening shortness of breath, initially present with exertion and now at rest, associated with subjective fevers, chills, nausea and poor oral intake, productive cough of clear to yellow sputum.  Clinical Impression  Pt presents with deconditioning.  Recommend continued frequent ambulation with nursing with continued monitoring of activity O2 sats.  Will follow for acute PT as well to increase strength and prepare for independence at home    PT Assessment  Patient needs continued PT services    Follow Up Recommendations  No PT follow up (pt wants to continued OPPT when he is able)    Does the patient have the potential to tolerate intense rehabilitation      Barriers to Discharge        Equipment Recommendations  None recommended by PT    Recommendations for Other Services OT consult   Frequency Min 3X/week    Precautions / Restrictions Precautions Precautions: Fall Precaution Comments: pt reports he gets OOB with ambulation Restrictions Weight Bearing Restrictions: No   Pertinent Vitals/Pain C/io pain in chest and has bloody sputum productive cough      Mobility  Bed Mobility Bed Mobility: Supine to Sit;Sit to Supine Supine to Sit: 5: Supervision Sit to Supine: 5: Supervision Transfers Transfers: Sit to Stand;Stand to Sit Sit to Stand: 5: Supervision Stand to Sit: 5: Supervision Ambulation/Gait Ambulation/Gait Assistance: 4: Min assist Assistive device: Rolling walker Ambulation/Gait Assistance Details: cues to slow down to control breathing, try to pursed lip breathe , encourage rest as needed Gait  Pattern: Step-through pattern;Decreased step length - right;Decreased step length - left Gait velocity: decreased General Gait Details: Pt is able to use RW for support and needs occasional cues  as above. Pt c/o feeling breathless and O2 sats on my meter were 86%, but question if that is correct.  After next attempt at walking O2 sats 93%.  Pt has occasional production of bloody sputum Stairs: No Wheelchair Mobility Wheelchair Mobility: No    Exercises General Exercises - Lower Extremity Ankle Circles/Pumps: AROM;Both;10 reps;Supine Hip Flexion/Marching: AROM;Both;5 reps;Supine   PT Diagnosis: Difficulty walking;Generalized weakness  PT Problem List: Decreased strength;Decreased activity tolerance;Decreased balance PT Treatment Interventions: Gait training;Stair training;Therapeutic activities     PT Goals(Current goals can be found in the care plan section) Acute Rehab PT Goals Patient Stated Goal: to go home, get stronger and return to outpatient PT as he had been going prior to admission PT Goal Formulation: With patient Time For Goal Achievement: 10/31/12 Potential to Achieve Goals: Good  Visit Information  Last PT Received On: 10/17/12 Assistance Needed: +1 History of Present Illness: Pt is 42 yo male with history of alcohol abuse and seizures, HTN who presents to [facility] ED with main concern of progressively worsening shortness of breath, initially present with exertion and now at rest, associated with subjective fevers, chills, nausea and poor oral intake, productive cough of clear to yellow sputum.       Prior Functioning  Home Living Family/patient expects to be discharged to:: Private residence Living Arrangements: Alone Available Help at Discharge: Family (father) Type of Home: Mobile home Home Access: Stairs to enter Entrance Stairs-Number of Steps: 4 Entrance  Stairs-Rails: Right;Left Home Layout: One level Home Equipment: Walker - 2 wheels;Wheelchair - manual;Bedside  commode Additional Comments: L platform Prior Function Level of Independence: Independent Communication Communication: No difficulties    Cognition  Cognition Arousal/Alertness: Awake/alert Behavior During Therapy: WFL for tasks assessed/performed Overall Cognitive Status: Within Functional Limits for tasks assessed (pt with loud crepitus with LLE hip and knee flex)    Extremity/Trunk Assessment Lower Extremity Assessment Lower Extremity Assessment: Overall WFL for tasks assessed (generalized muscle atrophy) Cervical / Trunk Assessment Cervical / Trunk Assessment: Normal   Balance Balance Balance Assessed: Yes Static Sitting Balance Static Sitting - Balance Support: No upper extremity supported Static Sitting - Level of Assistance: 7: Independent Static Standing Balance Static Standing - Balance Support: Bilateral upper extremity supported;During functional activity Static Standing - Level of Assistance: 6: Modified independent (Device/Increase time)  End of Session PT - End of Session Equipment Utilized During Treatment: Gait belt Activity Tolerance: Patient limited by fatigue Patient left: in chair;with chair alarm set Nurse Communication: Mobility status  GP    Bayard Hugger. Manson Passey, [place] [phone] 10/17/2012, 9:53 AM

## 2012-10-18 ENCOUNTER — Inpatient Hospital Stay (HOSPITAL_COMMUNITY): Payer: No Typology Code available for payment source

## 2012-10-18 ENCOUNTER — Encounter (HOSPITAL_COMMUNITY): Payer: Self-pay | Admitting: Radiology

## 2012-10-18 DIAGNOSIS — F1020 Alcohol dependence, uncomplicated: Secondary | ICD-10-CM

## 2012-10-18 DIAGNOSIS — K5289 Other specified noninfective gastroenteritis and colitis: Secondary | ICD-10-CM

## 2012-10-18 DIAGNOSIS — J189 Pneumonia, unspecified organism: Secondary | ICD-10-CM | POA: Diagnosis not present

## 2012-10-18 DIAGNOSIS — Z113 Encounter for screening for infections with a predominantly sexual mode of transmission: Secondary | ICD-10-CM

## 2012-10-18 DIAGNOSIS — A0472 Enterocolitis due to Clostridium difficile, not specified as recurrent: Secondary | ICD-10-CM

## 2012-10-18 LAB — CBC
Hemoglobin: 10.4 g/dL — ABNORMAL LOW (ref 13.0–17.0)
MCH: 33.7 pg (ref 26.0–34.0)
MCV: 102.6 fL — ABNORMAL HIGH (ref 78.0–100.0)
RBC: 3.09 MIL/uL — ABNORMAL LOW (ref 4.22–5.81)
WBC: 11.2 10*3/uL — ABNORMAL HIGH (ref 4.0–10.5)

## 2012-10-18 LAB — COMPREHENSIVE METABOLIC PANEL
AST: 22 U/L (ref 0–37)
Alkaline Phosphatase: 66 U/L (ref 39–117)
BUN: 6 mg/dL (ref 6–23)
CO2: 22 mEq/L (ref 19–32)
Calcium: 8.5 mg/dL (ref 8.4–10.5)
Chloride: 100 mEq/L (ref 96–112)
Creatinine, Ser: 0.44 mg/dL — ABNORMAL LOW (ref 0.50–1.35)
GFR calc Af Amer: >90 mL/min (ref >90)
GFR calc non Af Amer: >90 mL/min (ref >90)
Potassium: 3.5 mEq/L (ref 3.5–5.1)

## 2012-10-18 LAB — CULTURE, BLOOD (ROUTINE X 2)

## 2012-10-18 MED ORDER — IOHEXOL 300 MG/ML  SOLN
100.0000 mL | Freq: Once | INTRAMUSCULAR | Status: AC | PRN
  Administered 2012-10-18: 100 mL via INTRAVENOUS

## 2012-10-18 MED ORDER — IOHEXOL 300 MG/ML  SOLN
50.0000 mL | Freq: Once | INTRAMUSCULAR | Status: AC | PRN
  Administered 2012-10-18: 50 mL via ORAL

## 2012-10-18 NOTE — Care Management Note (Addendum)
    Page 1 of 2   10/22/2012     1:30:47 PM   CARE MANAGEMENT NOTE 10/22/2012  Patient:  Eugene Jackson, Eugene Jackson   Account Number:  [id number]  Date Initiated:  10/11/2012  Documentation initiated by:  DAVIS,RHONDA  Subjective/Objective Assessment:   pt with hx of etoh now presents with pna     Action/Plan:   home when stable   Anticipated DC Date:  10/22/2012   Anticipated DC Plan:  HOME/SELF CARE  In-house referral  Clinical Social Worker      DC Planning Services  CM consult      [facility]  NA   Choice offered to / List presented to:  NA   DME arranged  NA      DME agency  NA     HH arranged  NA      HH agency  NA   Status of service:  Completed, signed off Medicare Important Message given?  NA - LOS <3 / Initial given by admissions (If response is "NO", the following Medicare IM given date fields will be blank) Date Medicare IM given:   Date Additional Medicare IM given:    Discharge Disposition:  HOME/SELF CARE  Per UR Regulation:  Reviewed for med. necessity/level of care/duration of stay  If discussed at Long Length of Stay Meetings, dates discussed:   10/18/2012    Comments:  10/22/12 Rayquan Amrhein RN,BSN NCM [phone] D/C HOME NO NEEDS OR ORDERS.  10/18/12 Fleurette Woolbright RN,BSN NCM [phone] PCCM/ID  FOLLOWING.PNA,EFFUSIONS,HEMOPTYSIS,GM-RODS,DECUB ULCERS.IV ABX.AWAITING CX.PT-NO F/U.D/C PLAN HOME.  [id number] Earlene Plater, RN, BSN, [place] [phone] Chart Reviewed for discharge and hospital needs. Discharge needs at time of review:  None  Patient transferred to tele bed. Review of patient progress due on [id number].   [id number] Earlene Plater RN, BSN, [place] [phone] Chart Reviewed for discharge and hospital needs. Discharge needs at time of review:  None Review of patient progress due on [id number].

## 2012-10-18 NOTE — Progress Notes (Addendum)
PULMONARY  / CRITICAL CARE MEDICINE  Name: Eugene Jackson MRN: [id number] DOB: 04-Aug-1970    ADMISSION DATE:  10/10/2012 CONSULTATION DATE:  10/13/2012  REFERRING MD : Elvera Lennox  CHIEF COMPLAINT: Cough  BRIEF PATIENT DESCRIPTION:  42 yo male smoker presented with dyspnea, fever, chills, nausea and productive cough.  Noted to have acute hypoxic respiratory failure and hyponatremia from HCAP and ETOH.  Developed hemoptysis 10/11 and PCCM consulted.  SIGNIFICANT EVENTS: 10/08 Admit 10/11 Hemoptysis >> PCCM consulted 10-14 continued hemoptysis STUDIES:  10/11 CT chest >> LLL, lingular, Lt upper lobe consolidation; patchy RUL and RML infiltrate; two lesions RLL mass vs consolidation; small b/l effusions 10/12 Echo >> EF 55%, grade 1 diastolic dysfx  LINES / TUBES: PIV  CULTURES: Blood 10/08 >> clostridium 2/2 Sputum 10/08 >> oral flora HIV 10/08 >> Non reactive Pneumococcal Ag 10/09 >> POSITIVE  Legionella Ag 10/09 >> Negative Quantiferon gold 10/11 >> neg  ANTIBIOTICS: Cefepime 10/08 >> 10/10 Zosyn 10/08 >> 10/08  Vancomycin 10/08 >> 10/10 Meropenem 10/11 >>   SUBJECTIVE:  Still coughing up blood, but less amount. Looks better  VITAL SIGNS: Temp:  [98.3 F (36.8 C)-98.9 F (37.2 C)] 98.9 F (37.2 C) (10/16 0452) Pulse Rate:  [86-94] 86 (10/16 0452) Resp:  [20-24] 20 (10/16 0452) BP: (100-120)/(58-66) 120/66 mmHg (10/16 0452) SpO2:  [90 %-98 %] 98 % (10/16 0452) Room air  INTAKE / OUTPUT: Intake/Output     10/15 0701 - 10/16 0700 10/16 0701 - 10/17 0700   P.O. 720 240   I.V. (mL/kg)     IV Piggyback     Total Intake(mL/kg) 720 (11.3) 240 (3.8)   Urine (mL/kg/hr) 1025 (0.7) 300 (1.4)   Total Output 1025 300   Net -305 -60          PHYSICAL EXAMINATION: General: No distress,cointinued hempotysis , old dark blodd. Neuro:  Alert, normal strength, strange affect HEENT:  No sinus tenderness Cardiovascular:  Regular, no murmur Lungs:  no rhonchi, no wheeze,  decreased left base Abdomen:  Soft, non tender Musculoskeletal:  No edema Skin:  No rashes  LABS:  CBC Recent Labs     10/16/12  0510  10/18/12  0440  WBC  11.9*  11.2*  HGB  9.9*  10.4*  HCT  30.0*  31.7*  PLT  541*  679*   Coag's No results found for this basename: APTT, INR,  in the last 72 hours BMET Recent Labs     10/16/12  0510  10/17/12  0545  10/18/12  0440  NA  133*  133*  134*  K  3.7  3.8  3.5  CL  100  99  100  CO2  23  23  22   BUN  7  7  6   CREATININE  0.42*  0.42*  0.44*  GLUCOSE  98  101*  76   Electrolytes Recent Labs     10/16/12  0510  10/17/12  0545  10/18/12  0440  CALCIUM  8.8  8.9  8.5  MG  1.9   --    --   PHOS  3.7   --    --    Imaging Dg Chest 2 View  10/17/2012   CLINICAL DATA:  Cough, pneumonia.  EXAM: CHEST  2 VIEW  COMPARISON:  October 16, 2012.  FINDINGS: Right lung is clear. Stable left lower lobe opacity consistent with pneumonia or atelectasis with associated pleural effusion. The visualized portions of the cardiomediastinal silhouette  appear normal. No pneumothorax is seen. Minimal right pleural effusion is noted.  IMPRESSION: Stable left lung opacity consistent with pneumonia or atelectasis with associated pleural effusion.   Electronically Signed   By: Roque Lias M.D.   On: 10/17/2012 10:03   Dg Chest Bilateral Decubitus  10/17/2012   CLINICAL DATA:  Left parapneumonic pleural effusion. Weakness.  EXAM: CHEST - BILATERAL DECUBITUS VIEW  COMPARISON:  CHEST x-ray 10/17/2012.  FINDINGS: Bilateral decubitus views of the chest demonstrate some layering of the moderate to large left pleural effusion on the left lateral decubitus view. However, on the right lateral decubitus view, the effusion fails to layer along the medial aspect of the left hemithorax, which suggests at least partial loculation of the fluid. Extensive atelectasis and or consolidation in the base of the left lung is unchanged.  IMPRESSION: 1. Moderate to large left  pleural effusion appears slightly complex. There is likely a freely flowing component as well as a partially loculated component to this parapneumonic effusion.   Electronically Signed   By: Trudie Reed M.D.   On: 10/17/2012 16:13   Principal Problem:   HCAP (healthcare-associated pneumonia) Active Problems:   Alcohol abuse   Hyponatremia   Anemia of chronic disease   Abnormal liver function tests   Moderate protein-calorie malnutrition   Acute respiratory failure   Bacteremia   Hemoptysis   Tobacco abuse  ASSESSMENT / PLAN: A: 42 yo male smoker with HCAP in setting of ETOH and recent hospital admission.  Has extensive b/l infiltrates.  Developed hemoptysis in setting of this.   urine pneumococcal antigen positive. parapneumonic effusion ? Partially loculated  Quantiferon gold assay from 10/11  neg P: -ctcefepime , dc &vanc, will need longer course x 3-4 wks -[place] chest -if significant - consider thoracentesis -needs to stop smoking -have asked ID to see -PCCM to continue to follow.  Cyril Mourning MD. Tonny Bollman. [facility] Pager [phone] If no response call [phone]   10/18/2012, 10:22 AM

## 2012-10-18 NOTE — Progress Notes (Signed)
Clinical Social Work Department BRIEF PSYCHOSOCIAL ASSESSMENT 10/18/2012  Patient:  Eugene Jackson, Eugene Jackson     Account Number:  [id number]     Admit date:  10/10/2012  Clinical Social Worker:  Hattie Perch  Date/Time:  10/18/2012 12:00 M  Referred by:  Physician  Date Referred:  10/18/2012 Referred for  Substance Abuse   Other Referral:   Interview type:  Patient Other interview type:    PSYCHOSOCIAL DATA Living Status:  FAMILY Admitted from facility:   Level of care:   Primary support name:  Kathlene Lenig Primary support relationship to patient:  FAMILY Degree of support available:   good    CURRENT CONCERNS Current Concerns  Substance Abuse   Other Concerns:    SOCIAL WORK ASSESSMENT / PLAN CSW met with patient. patient is alert and oriented X3. CSW discussed ETOH cessation. patient states that he is not interested in any resources at this time. He does not want to go to AA. He states that he went to outpatient a few years ago when he got a DUI and does not want to go again.   Assessment/plan status:   Other assessment/ plan:   Information/referral to community resources:    PATIENT'S/FAMILY'S RESPONSE TO PLAN OF CARE: patient adamantly refusing any treatment or resources regarding his alcoholism, although, he admits his problem. no further CSW needs. CSW signing off.

## 2012-10-18 NOTE — Progress Notes (Addendum)
Patient ID: Eugene Jackson, male   DOB: March 02, 1970, 42 y.o.   MRN: [id number] [facility] PROGRESS NOTE  DEWIE AHART BJY:[id number] DOB: 1970-04-12 DOA: 10/10/2012 PCP: Nonnie Done., MD  Brief narrative:  42 yo male smoker presented with dyspnea, fever, chills, nausea and productive cough on 10/10/2012. Noted to have acute hypoxic respiratory failure and hyponatremia from HCAP and ETOH. Developed hemoptysis 10/11 and PCCM consulted.   Principal Problem:  Acute respiratory failure  - secondary to HCAP and pleural effusion - pt maintaining oxygen saturations at target range  - Chest x-ray 10/17/2012 with moderate to large left pleural effusion which appears complex, questionable parapneumonic effusion - Patient is clinically stable this morning, will continue to followup with PCP and recommendations HCAP (healthcare-associated pneumonia)  - CT 10/11 with worsening multifocal pneumonia. Pulmonary following, appreciate input.  - still coughing up blood tinged sputum but pt overall clinically stable  - pneumococcal antigen +  - Given the gram-positive rods in blood culture, continuing meropenem which should be adequate coverage, final report on culture sensitivities pending Active Problems:  GPR bacteremia  - on Meropenem. No history of drug resistant organisms, awaiting speciation.  - Meropenem should cover Hyponatremia  - stable overall ~134 - BMP in AM  Anemia of chronic disease  - alcohol induced bone marrow damage  - Hg/Hct stable and at baseline  - CBC in AM  Moderate protein-calorie malnutrition  - secondary to acute illness imposed on alcohol abuse and poor oral intake  - encouraged PO intake  Abnormal liver function tests  - secondary to alcohol induced liver damage  - within normal limits 10/18/2012 Tobacco abuse  - cessation discussed in detail  Alcohol abuse  - improved, continue to keep on CIWA protocol  - cessation discussed in detail  DVT  Prophylaxis - SCDs   Consultants:  PCCM Procedures/Studies:  10/11 CT chest >> LLL, lingular, Lt upper lobe consolidation; patchy RUL and RML infiltrate; two lesions RLL mass vs consolidation; small b/l effusions  10/12 Echo >> EF 55%, grade 1 diastolic dysfx  10/15 CXR >> Stable left lung opacity consistent with pneumonia or atelectasis with associated pleural effusion.  10/14 CXR >> Worsening of pneumonia left greater than right with possible cavitation in the region of the lingula and or left lower lobe.  CULTURES:  Blood 10/08 >> Gram pos rods >>  Sputum 10/08 >> oral flora  HIV 10/08 >> Non reactive  Pneumococcal Ag 10/09 >> POSITIVE  Legionella Ag 10/09 >> Negative  Quantiferon gold 10/11 >>  ANTIBIOTICS:  Cefepime 10/08 >> 10/10  Zosyn 10/08 >> 10/08  Vancomycin 10/08 >> 10/10  Meropenem 10/11 >>   Code Status: Full  Family Communication: Pt at bedside  Disposition Plan: Home when final blood culture sensitivity report available so that we can tailor antibiotic appropriately  HPI/Subjective: No events overnight.   Objective: Filed Vitals:   10/17/12 0532 10/17/12 1300 10/17/12 2110 10/18/12 0452  BP: 115/70 100/58 117/64 120/66  Pulse: 91 90 94 86  Temp: 98.5 F (36.9 C) 98.3 F (36.8 C) 98.8 F (37.1 C) 98.9 F (37.2 C)  TempSrc: Oral Oral Oral   Resp: 20 24 20 20   Height:      Weight:      SpO2: 95% 90% 97% 98%    Intake/Output Summary (Last 24 hours) at 10/18/12 0927 Last data filed at 10/18/12 0835  Gross per 24 hour  Intake    720 ml  Output   1325 ml  Net   -605 ml    Exam:   General:  Pt is alert, follows commands appropriately, not in acute distress  Cardiovascular: Regular rate and rhythm, S1/S2, no murmurs, no rubs, no gallops  Respiratory: Poor air movement on the left side, crackles at the right lung base noted as well  Abdomen: Soft, non tender, non distended, bowel sounds present, no guarding  Extremities: No edema, pulses DP and  PT palpable bilaterally  Neuro: Grossly nonfocal  Data Reviewed: Basic Metabolic Panel:  Recent Labs Lab 10/12/12 0515 10/13/12 0354 10/14/12 0817 10/15/12 0345 10/16/12 0510 10/17/12 0545 10/18/12 0440  NA 130* 129* 131* 134* 133* 133* 134*  K 3.0* 3.2* 3.3* 3.3* 3.7 3.8 3.5  CL 97 96 96 99 100 99 100  CO2 24 24 23 25 23 23 22   GLUCOSE 95 94 101* 135* 98 101* 76  BUN 8 7 8 7 7 7 6   CREATININE 0.50 0.57 0.44* 0.43* 0.42* 0.42* 0.44*  CALCIUM 8.1* 8.4 8.6 8.3* 8.8 8.9 8.5  MG 1.9 1.9  --   --  1.9  --   --   PHOS 2.3 3.0  --   --  3.7  --   --    Liver Function Tests:  Recent Labs Lab 10/12/12 0515 10/18/12 0440  AST 14 22  ALT 7 15  ALKPHOS 92 66  BILITOT 0.8 0.2*  PROT 5.6* 6.0  ALBUMIN 2.0* 2.0*   CBC:  Recent Labs Lab 10/13/12 0354 10/14/12 0817 10/15/12 0345 10/16/12 0510 10/18/12 0440  WBC 12.8* 10.6* 9.9 11.9* 11.2*  HGB 9.3* 10.3* 9.8* 9.9* 10.4*  HCT 28.2* 31.0* 29.4* 30.0* 31.7*  MCV 105.6* 104.7* 103.5* 103.1* 102.6*  PLT 292 416* 457* 541* 679*     Recent Results (from the past 240 hour(s))  CULTURE, BLOOD (ROUTINE X 2)     Status: None   Collection Time    10/10/12  6:02 PM      Result Value Range Status   Specimen Description BLOOD RIGHT ANTECUBITAL   Final   Special Requests BOTTLES DRAWN AEROBIC AND ANAEROBIC 5CC   Final   Culture  Setup Time     Final   Value: 10/11/2012 02:20     Performed at [facility]   Culture     Final   Value: GRAM POSITIVE RODS     Note: Anaerobic     Note: Gram Stain Report Called to,Read Back By and Verified With: MEGAN STOCKS ON 10/11/2012 AT 10:28P BY WILEJ     Performed at [facility]   Report Status PENDING   Incomplete  CULTURE, BLOOD (ROUTINE X 2)     Status: None   Collection Time    10/10/12  6:02 PM      Result Value Range Status   Specimen Description BLOOD LEFT ANTECUBITAL   Final   Special Requests BOTTLES DRAWN AEROBIC AND ANAEROBIC 5CC   Final   Culture  Setup Time      Final   Value: 10/11/2012 02:21     Performed at [facility]   Culture     Final   Value: GRAM POSITIVE RODS     Note: Anaerobic Gram Positive Rod     Note: Gram Stain Report Called to,Read Back By and Verified With: MEGAN STOCKS ON 10/11/2012 AT 10:28P BY WILEJ     Performed at [facility]   Report Status PENDING   Incomplete  CULTURE, RESPIRATORY (NON-EXPECTORATED)  Status: None   Collection Time    10/10/12  9:46 PM      Result Value Range Status   Specimen Description SPUTUM   Final   Special Requests NONE   Final   Gram Stain     Final   Value: ABUNDANT WBC PRESENT, PREDOMINANTLY PMN     FEW SQUAMOUS EPITHELIAL CELLS PRESENT     ABUNDANT GRAM POSITIVE RODS     ABUNDANT GRAM POSITIVE COCCI IN PAIRS     FEW GRAM NEGATIVE RODS   Culture     Final   Value: NORMAL OROPHARYNGEAL FLORA     Performed at [facility]   Report Status 10/13/2012 FINAL   Final  MRSA PCR SCREENING     Status: None   Collection Time    10/10/12  9:47 PM      Result Value Range Status   MRSA by PCR NEGATIVE  NEGATIVE Final   Comment:            The GeneXpert MRSA Assay (FDA     approved for NASAL specimens     only), is one component of a     comprehensive MRSA colonization     surveillance program. It is not     intended to diagnose MRSA     infection nor to guide or     monitor treatment for     MRSA infections.     Scheduled Meds: . famotidine  20 mg Oral QHS  . folic acid  1 mg Oral Daily  . meropenem (MERREM) IV  1 g Intravenous Q8H  . multivitamin with minerals  1 tablet Oral Daily  . thiamine  500 mg Oral Daily   Continuous Infusions:    Debbora Presto, MD  [facility] Pager [phone]  If 7PM-7AM, please contact night-coverage [email or web address] Password [email or web address] 10/18/2012, 9:27 AM   LOS: 8 days

## 2012-10-18 NOTE — Consult Note (Signed)
[facility]    Date of Admission:  10/10/2012  Date of Consult:  10/18/2012  Reason for Consult:Clostridial bacteremia Referring Physician: Dr. Vassie Loll   HPI: Eugene Jackson is an 42 y.o. male with hx of Etohism, AVN sp B THA, fracture at [facility] site admission in August with N, V, D, hypotensive, admitted to [facility] with hypoK, hypoNA, pancytopenia, thought to be intoxicated with etoh at that time. He was hypotensive, with lactiic acidosis.     During that admission CT of the abdomen and pelvis showed:  "Wall thickening involving the majority of the colon and possibly  the terminal ileum, worrisome for infectious/inflammatory  enterocolitis. Rectum and sigmoid colon are relatively spared. "   During that admission he was rx with vancomycin, zosyn, pressors intially,  C diff PCR was negative, Urine cx negative, Blood cx neg from admission. He had seizures along the way. Abx were changed to cipro and flagyl and continued thru 09/03/12. He was ultimately DC to home on 09/07/12. He remembers very little of that admission at all and only with my prompting him with epic notes could he recall anything about it, nor could his father who was also present. He then presented to [facility] ED worsening shortness of breath, initially present with exertion and now at rest, associated with subjective fevers, chills, nausea and poor oral intake, productive cough of clear to yellow sputum that has also been blood tinged.  CXR had showed dense consolidation in lingula and LLL.   He was started on vancomycin and cefepime. Blood and sputum cultures were obtained  He was still persistently febrile  And   CT on 10/13/12 showed:   1. Extensive pneumonia, seen throughout both lungs but most evident  in the left lower lobe and in the lingula of the left upper lobe,  but also significant in the right upper lobe. There are associated  pleural effusions, larger on the left, where it mostly  accumulates  in the mid to upper hemi thorax and along the left superior oblique  fissure.  2. Reactive adenopathy.  3. Multifocal pneumonia appears worsened when compared to the prior  chest radiograph.  On 10/13/12 he was changed over to [facility] and has continued to improve on meropenem.   His admission blood cultures are now growing Clostridial species in 2/2 cultures.     Past Medical History  Diagnosis Date  . Hypertension   . Broken femur     right  . PONV (postoperative nausea and vomiting)     Past Surgical History  Procedure Laterality Date  . Wrist surgery    . Joint replacement      bilateral hip replacement  ergies:   No Known Allergies   Medications: I have reviewed patients current medications as documented in Epic Anti-infectives   Start     Dose/Rate Route Frequency Ordered Stop   10/13/12 1800  meropenem (MERREM) 1 g in sodium chloride 0.9 % 100 mL IVPB     1 g 200 mL/hr over 30 Minutes Intravenous Every 8 hours 10/13/12 1001     10/13/12 1100  meropenem (MERREM) 1 g in sodium chloride 0.9 % 100 mL IVPB     1 g 200 mL/hr over 30 Minutes Intravenous  Once 10/13/12 1001 10/13/12 1103   10/12/12 1200  vancomycin (VANCOCIN) 1,250 mg in sodium chloride 0.9 % 250 mL IVPB  Status:  Discontinued     1,250 mg 166.7 mL/hr over 90 Minutes Intravenous  Every 8 hours 10/12/12 0639 10/13/12 0935   10/11/12 0600  vancomycin (VANCOCIN) IVPB 1000 mg/200 mL premix  Status:  Discontinued     1,000 mg 200 mL/hr over 60 Minutes Intravenous Every 8 hours 10/10/12 2109 10/12/12 0639   10/10/12 2200  vancomycin (VANCOCIN) 1,500 mg in sodium chloride 0.9 % 500 mL IVPB     1,500 mg 250 mL/hr over 120 Minutes Intravenous  Once 10/10/12 2108 10/11/12 0100   10/10/12 2130  ceFEPIme (MAXIPIME) 1 g in dextrose 5 % 50 mL IVPB  Status:  Discontinued     1 g 100 mL/hr over 30 Minutes Intravenous 3 times per day 10/10/12 2056 10/13/12 0935   10/10/12 1730  vancomycin (VANCOCIN) IVPB  1000 mg/200 mL premix     1,000 mg 200 mL/hr over 60 Minutes Intravenous  Once 10/10/12 1729 10/10/12 2005   10/10/12 1730  piperacillin-tazobactam (ZOSYN) IVPB 3.375 g     3.375 g 12.5 mL/hr over 240 Minutes Intravenous  Once 10/10/12 1729 10/10/12 1904      Social History:  reports that he has been smoking Cigarettes.  He has a 13 pack-year smoking history. He has never used smokeless tobacco. He reports that he drinks alcohol. He reports that he does not use illicit drugs.  History reviewed. No pertinent family history.  As in HPI and primary teams notes otherwise 12 point review of systems is negative  Blood pressure 108/64, pulse 96, temperature 99.9 F (37.7 C), temperature source Oral, resp. rate 20, height 5\' 9"  (1.753 m), weight 141 lb 1.5 oz (64 kg), SpO2 96.00%. General: Alert and awake, oriented x3, not in any acute distress. He showed me blood tinged sputum with particulate matter he is still expectorating HEENT: anicteric sclera, pupils reactive to light and accommodation, EOMI, oropharynx clear and without exudate CVS regular rate, normal r,  no murmur rubs or gallops Chest: diminished bs at bases, L>R Abdomen: soft nontender, nondistended, normal bowel sounds, Extremities: THA sites are well healed Skin: no rashes Neuro: nonfocal, strength and sensation intact   Results for orders placed during the hospital encounter of 10/10/12 (from the past 48 hour(s))  BASIC METABOLIC PANEL     Status: Abnormal   Collection Time    10/17/12  5:45 AM      Result Value Range   Sodium 133 (*) 135 - 145 mEq/L   Potassium 3.8  3.5 - 5.1 mEq/L   Chloride 99  96 - 112 mEq/L   CO2 23  19 - 32 mEq/L   Glucose, Bld 101 (*) 70 - 99 mg/dL   BUN 7  6 - 23 mg/dL   Creatinine, Ser 5.78 (*) 0.50 - 1.35 mg/dL   Calcium 8.9  8.4 - 46.9 mg/dL   GFR calc non Af Amer >90  >90 mL/min   GFR calc Af Amer >90  >90 mL/min   Comment: (NOTE)     The eGFR has been calculated using the CKD EPI  equation.     This calculation has not been validated in all clinical situations.     eGFR's persistently <90 mL/min signify possible Chronic Kidney     Disease.  COMPREHENSIVE METABOLIC PANEL     Status: Abnormal   Collection Time    10/18/12  4:40 AM      Result Value Range   Sodium 134 (*) 135 - 145 mEq/L   Potassium 3.5  3.5 - 5.1 mEq/L   Chloride 100  96 - 112 mEq/L  CO2 22  19 - 32 mEq/L   Glucose, Bld 76  70 - 99 mg/dL   BUN 6  6 - 23 mg/dL   Creatinine, Ser 1.61 (*) 0.50 - 1.35 mg/dL   Calcium 8.5  8.4 - 09.6 mg/dL   Total Protein 6.0  6.0 - 8.3 g/dL   Albumin 2.0 (*) 3.5 - 5.2 g/dL   AST 22  0 - 37 U/L   ALT 15  0 - 53 U/L   Alkaline Phosphatase 66  39 - 117 U/L   Total Bilirubin 0.2 (*) 0.3 - 1.2 mg/dL   GFR calc non Af Amer >90  >90 mL/min   GFR calc Af Amer >90  >90 mL/min   Comment: (NOTE)     The eGFR has been calculated using the CKD EPI equation.     This calculation has not been validated in all clinical situations.     eGFR's persistently <90 mL/min signify possible Chronic Kidney     Disease.  CBC     Status: Abnormal   Collection Time    10/18/12  4:40 AM      Result Value Range   WBC 11.2 (*) 4.0 - 10.5 K/uL   RBC 3.09 (*) 4.22 - 5.81 MIL/uL   Hemoglobin 10.4 (*) 13.0 - 17.0 g/dL   HCT 04.5 (*) 40.9 - 81.1 %   MCV 102.6 (*) 78.0 - 100.0 fL   MCH 33.7  26.0 - 34.0 pg   MCHC 32.8  30.0 - 36.0 g/dL   RDW 91.4 (*) 78.2 - 95.6 %   Platelets 679 (*) 150 - 400 K/uL      Component Value Date/Time   SDES URINE, RANDOM 10/11/2012 0312   SPECREQUEST NONE 10/11/2012 0312   CULT  Value: NORMAL OROPHARYNGEAL FLORA Performed at [facility] 10/10/2012 2146   REPTSTATUS 10/11/2012 FINAL 10/11/2012 0312   Dg Chest 2 View  10/17/2012   CLINICAL DATA:  Cough, pneumonia.  EXAM: CHEST  2 VIEW  COMPARISON:  October 16, 2012.  FINDINGS: Right lung is clear. Stable left lower lobe opacity consistent with pneumonia or atelectasis with associated pleural effusion.  The visualized portions of the cardiomediastinal silhouette appear normal. No pneumothorax is seen. Minimal right pleural effusion is noted.  IMPRESSION: Stable left lung opacity consistent with pneumonia or atelectasis with associated pleural effusion.   Electronically Signed   By: Roque Lias M.D.   On: 10/17/2012 10:03   Dg Chest Bilateral Decubitus  10/17/2012   CLINICAL DATA:  Left parapneumonic pleural effusion. Weakness.  EXAM: CHEST - BILATERAL DECUBITUS VIEW  COMPARISON:  CHEST x-ray 10/17/2012.  FINDINGS: Bilateral decubitus views of the chest demonstrate some layering of the moderate to large left pleural effusion on the left lateral decubitus view. However, on the right lateral decubitus view, the effusion fails to layer along the medial aspect of the left hemithorax, which suggests at least partial loculation of the fluid. Extensive atelectasis and or consolidation in the base of the left lung is unchanged.  IMPRESSION: 1. Moderate to large left pleural effusion appears slightly complex. There is likely a freely flowing component as well as a partially loculated component to this parapneumonic effusion.   Electronically Signed   By: Trudie Reed M.D.   On: 10/17/2012 16:13     Recent Results (from the past 720 hour(s))  CULTURE, BLOOD (ROUTINE X 2)     Status: None   Collection Time    10/10/12  6:02 PM  Result Value Range Status   Specimen Description BLOOD RIGHT ANTECUBITAL   Final   Special Requests BOTTLES DRAWN AEROBIC AND ANAEROBIC 5CC   Final   Culture  Setup Time     Final   Value: 10/11/2012 02:20     Performed at [facility]   Culture     Final   Value: CLOSTRIDIUM SPECIES     Note: Gram Stain Report Called to,Read Back By and Verified With: MEGAN STOCKS ON 10/11/2012 AT 10:28P BY WILEJ     Performed at [facility]   Report Status 10/18/2012 FINAL   Final  CULTURE, BLOOD (ROUTINE X 2)     Status: None   Collection Time    10/10/12  6:02 PM       Result Value Range Status   Specimen Description BLOOD LEFT ANTECUBITAL   Final   Special Requests BOTTLES DRAWN AEROBIC AND ANAEROBIC 5CC   Final   Culture  Setup Time     Final   Value: 10/11/2012 02:21     Performed at [facility]   Culture     Final   Value: CLOSTRIDIUM SPECIES     Note: Gram Stain Report Called to,Read Back By and Verified With: MEGAN STOCKS ON 10/11/2012 AT 10:28P BY WILEJ     Performed at [facility]   Report Status 10/18/2012 FINAL   Final  CULTURE, RESPIRATORY (NON-EXPECTORATED)     Status: None   Collection Time    10/10/12  9:46 PM      Result Value Range Status   Specimen Description SPUTUM   Final   Special Requests NONE   Final   Gram Stain     Final   Value: ABUNDANT WBC PRESENT, PREDOMINANTLY PMN     FEW SQUAMOUS EPITHELIAL CELLS PRESENT     ABUNDANT GRAM POSITIVE RODS     ABUNDANT GRAM POSITIVE COCCI IN PAIRS     FEW GRAM NEGATIVE RODS   Culture     Final   Value: NORMAL OROPHARYNGEAL FLORA     Performed at [facility]   Report Status 10/13/2012 FINAL   Final  MRSA PCR SCREENING     Status: None   Collection Time    10/10/12  9:47 PM      Result Value Range Status   MRSA by PCR NEGATIVE  NEGATIVE Final   Comment:            The GeneXpert MRSA Assay (FDA     approved for NASAL specimens     only), is one component of a     comprehensive MRSA colonization     surveillance program. It is not     intended to diagnose MRSA     infection nor to guide or     monitor treatment for     MRSA infections.     Impression/Recommendation  42 year old alcholic admitted with septic picture, obtunded in late August (28th) with CT showing pancolitis and terminial ileitis sp IV zosyn, vanco, then cipro, flagyl, now readmitted with multifocal PNA with prominent LLL and lingular invovlement and clostridial bacteremia  #1 Clostridial bacteremia: I suspect that the process seen on CT in late August may have some relevance to his  current presentation. Either he seeded his blood due to colonic infection at that time or he may have aspiration even that led to his multifocal pna at present  --I would like to get repeat CT abdomen and  pelvis to ensure that his colonic process is resolved and there is no abscess present in abdomen --fu ID and sensis of clostridial species --repeat blood cultures to ensure clearance --merrem fine for now but likely unnecessarily broad, likely just needs a beta lactam/beta lactamase inhibitor abx such as unasyn for ex  #2 Multifocal PNA: Could have been due to aspiraiton vs dissemination from initially occult blood stream infeciton  #3 Etohism : major contributot to his pathology and impaired immune system  #4 Screening: hiv negative, check hep panel Thank you so much for this interesting consult  [facility] [phone] (pager) [phone] (office) 10/18/2012, 4:11 PM  Paulette Blanch Dam 10/18/2012, 4:11 PM

## 2012-10-18 NOTE — Progress Notes (Signed)
I have reviewed this note and agree with all findings. Kati Pretty Weltman, PT, DPT Pager: [phone]   

## 2012-10-18 NOTE — Progress Notes (Signed)
Picked up patient from another nurse, I agree with her assessment and will continue to monitor.  Ernesta Amble, RN

## 2012-10-18 NOTE — Progress Notes (Signed)
Physical Therapy Treatment Patient Details Name: Eugene Jackson MRN: [id number] DOB: Mar 24, 1970 Today's Date: 10/18/2012 Time: 5784-6962 PT Time Calculation (min): 25 min  PT Assessment / Plan / Recommendation  History of Present Illness Pt is 42 yo male with history of alcohol abuse and seizures, HTN who presents to [facility] ED with main concern of progressively worsening shortness of breath, initially present with exertion and now at rest, associated with subjective fevers, chills, nausea and poor oral intake, productive cough of clear to yellow sputum.   PT Comments   Pt demonstrating progress as he was able to ambulate with less assist and perform more LE strengthening exercises today. Pt would continue to benefit from skilled PT to improve endurance, strength, and safety during functional mobility. Pt reports he was receiving OPPT for RLE prior to hospitalization and would like to continue upon d/c.  Follow Up Recommendations  Other (comment) (pt would like to continue OPPT that he was receiving prior to hospitalization.)     Does the patient have the potential to tolerate intense rehabilitation     Barriers to Discharge        Equipment Recommendations  None recommended by PT    Recommendations for Other Services    Frequency Min 3X/week   Progress towards PT Goals Progress towards PT goals: Progressing toward goals  Plan Current plan remains appropriate    Precautions / Restrictions Precautions Precautions: Fall Precaution Comments: pt reports he gets OOB with ambulation Restrictions Weight Bearing Restrictions: No   Pertinent Vitals/Pain Pt c/o of back/LEs/chest pain and SOB at all times but unable to rate. Pt took standing rest break during ambulation to decrease pain and positioned to comfort at end of session. SaO2 on room air at rest: 97-98%, SaO2 on room air during amb: 98%, and SaO2 on room air after amb: 98%. HR ranged between 95-102 bpm during session.    Mobility   Bed Mobility Bed Mobility: Supine to Sit;Sitting - Scoot to Edge of Bed Supine to Sit: 5: Supervision;With rails Sitting - Scoot to Edge of Bed: 5: Supervision Details for Bed Mobility Assistance: Supervision to ensure safety. SaO2 assessed during session and remained 97-98% on room air, see vitals section for details. Transfers Transfers: Sit to Stand;Stand to Sit Sit to Stand: 5: Supervision;With upper extremity assist;From bed Stand to Sit: 5: Supervision;To chair/3-in-1;With upper extremity assist;With armrests Details for Transfer Assistance: Supervision to ensure safety. Ambulation/Gait Ambulation/Gait Assistance: 4: Min guard Ambulation Distance (Feet): 200 Feet Assistive device: Rolling walker Ambulation/Gait Assistance Details: Min guard to ensure safety as pt reports back/LE/chest pain at all times but unable to rate. VC's to stay within RW, standing rest break taken halfway through. pt's SaO2 95% during amb. Gait Pattern: Step-through pattern;Decreased stride length;Trunk flexed Gait velocity: decreased Stairs: No    Exercises General Exercises - Lower Extremity Ankle Circles/Pumps: AROM;Both;20 reps;Seated Short Arc Quad: AROM;20 reps;Supine;Both Long Arc Quad: 20 reps;AROM;Seated;Both Hip ABduction/ADduction: AROM;Supine;Both;10 reps Straight Leg Raises: AROM;20 reps;Supine Hip Flexion/Marching: AROM;20 reps;Seated;Both   PT Diagnosis:    PT Problem List:   PT Treatment Interventions:     PT Goals (current goals can now be found in the care plan section)    Visit Information  Last PT Received On: 10/18/12 Assistance Needed: +1 History of Present Illness: Pt is 42 yo male with history of alcohol abuse and seizures, HTN who presents to [facility] ED with main concern of progressively worsening shortness of breath, initially present with exertion and now at rest, associated  with subjective fevers, chills, nausea and poor oral intake, productive cough of clear to yellow  sputum.    Subjective Data      Cognition  Cognition Arousal/Alertness: Awake/alert Behavior During Therapy: WFL for tasks assessed/performed Overall Cognitive Status: Within Functional Limits for tasks assessed    Balance     End of Session PT - End of Session Equipment Utilized During Treatment: Gait belt Activity Tolerance: Patient limited by fatigue Patient left: in chair;with chair alarm set;with call bell/phone within reach   GP     Sol Blazing 10/18/2012, 12:22 PM

## 2012-10-19 ENCOUNTER — Inpatient Hospital Stay (HOSPITAL_COMMUNITY): Payer: No Typology Code available for payment source

## 2012-10-19 DIAGNOSIS — A419 Sepsis, unspecified organism: Secondary | ICD-10-CM | POA: Diagnosis present

## 2012-10-19 DIAGNOSIS — J90 Pleural effusion, not elsewhere classified: Secondary | ICD-10-CM

## 2012-10-19 LAB — CULTURE, BLOOD (ROUTINE X 2)

## 2012-10-19 LAB — PH, BODY FLUID: pH, Fluid: 8

## 2012-10-19 LAB — BODY FLUID CELL COUNT WITH DIFFERENTIAL
Eos, Fluid: 1 %
Monocyte-Macrophage-Serous Fluid: 39 % — ABNORMAL LOW (ref 50–90)
Total Nucleated Cell Count, Fluid: 1525 cu mm — ABNORMAL HIGH (ref 0–1000)

## 2012-10-19 LAB — CBC
HCT: 31.2 % — ABNORMAL LOW (ref 39.0–52.0)
Hemoglobin: 10.2 g/dL — ABNORMAL LOW (ref 13.0–17.0)
MCV: 103 fL — ABNORMAL HIGH (ref 78.0–100.0)
RDW: 16.5 % — ABNORMAL HIGH (ref 11.5–15.5)
WBC: 13.1 10*3/uL — ABNORMAL HIGH (ref 4.0–10.5)

## 2012-10-19 LAB — LACTATE DEHYDROGENASE, PLEURAL OR PERITONEAL FLUID: LD, Fluid: 431 U/L — ABNORMAL HIGH (ref 3–23)

## 2012-10-19 LAB — BASIC METABOLIC PANEL
BUN: 6 mg/dL (ref 6–23)
Calcium: 8.8 mg/dL (ref 8.4–10.5)
Chloride: 101 mEq/L (ref 96–112)
Creatinine, Ser: 0.51 mg/dL (ref 0.50–1.35)
GFR calc Af Amer: >90 mL/min (ref >90)

## 2012-10-19 LAB — PROTEIN, TOTAL: Total Protein: 6.4 g/dL (ref 6.0–8.3)

## 2012-10-19 LAB — PROTEIN, BODY FLUID: Total protein, fluid: 3.4 g/dL

## 2012-10-19 LAB — HEPATITIS PANEL, ACUTE: Hep B C IgM: NONREACTIVE

## 2012-10-19 LAB — LACTATE DEHYDROGENASE: LDH: 448 U/L — ABNORMAL HIGH (ref 94–250)

## 2012-10-19 MED ORDER — SODIUM CHLORIDE 0.9 % IV SOLN
1.0000 g | INTRAVENOUS | Status: DC
  Administered 2012-10-19 – 2012-10-21 (×3): 1 g via INTRAVENOUS
  Filled 2012-10-19 (×4): qty 1

## 2012-10-19 MED ORDER — AMOXICILLIN-POT CLAVULANATE 875-125 MG PO TABS
1.0000 | ORAL_TABLET | Freq: Two times a day (BID) | ORAL | Status: DC
  Filled 2012-10-19: qty 1

## 2012-10-19 NOTE — Progress Notes (Signed)
Patient ID: Eugene Jackson, male   DOB: 08-20-1970, 42 y.o.   MRN: [id number]  [facility] PROGRESS NOTE  Eugene Jackson:[id number] DOB: [birth year] DOA: 10/10/2012 PCP: Nonnie Done., MD  Brief narrative: 42 yo male smoker presented with dyspnea, fever, chills, nausea and productive cough on 10/10/2012. Noted to have acute hypoxic respiratory failure and hyponatremia from HCAP and ETOH. Developed hemoptysis 10/11 and PCCM consulted.   Principal Problem:  Acute respiratory failure  - secondary to HCAP and pleural effusion  - pt maintaining oxygen saturations at target range  - Chest x-ray 10/17/2012 with moderate to large left pleural effusion which appears complex, questionable parapneumonic effusion  - status post therapeutic thoracentesis and doing well, clinically stable - follow up on fluid analysis HCAP (healthcare-associated pneumonia)  - CT 10/11 with worsening multifocal pneumonia.  - pneumococcal antigen +  - ABX changed to Augmentin  Active Problems:  GPR bacteremia  - on Meropenem. No history of drug resistant organisms, awaiting speciation.  - ABX changed to Augmentin  - appreciate ID input  Hyponatremia  - stable overall ~134  - BMP in AM  Anemia of chronic disease  - alcohol induced bone marrow damage  - Hg/Hct stable and at baseline  - CBC in AM  Moderate protein-calorie malnutrition  - secondary to acute illness imposed on alcohol abuse and poor oral intake  - encouraged PO intake  Abnormal liver function tests  - secondary to alcohol induced liver damage  - within normal limits 10/18/2012  Tobacco abuse  - cessation discussed in detail  Alcohol abuse  - improved, continue to keep on CIWA protocol  - cessation discussed in detail  Thrombocytosis - unclear etiology - monitor closely  DVT Prophylaxis - SCDs   Consultants:  PCCM ID Procedures/Studies:  10/11 CT chest >> LLL, lingular, Lt upper lobe consolidation; patchy RUL and RML  infiltrate; two lesions RLL mass vs consolidation; small b/l effusions  10/12 Echo >> EF 55%, grade 1 diastolic dysfx  10/15 CXR >> Stable left lung opacity consistent with pneumonia or atelectasis with associated pleural effusion.  10/14 CXR >> Worsening of pneumonia left greater than right with possible cavitation in the region of the lingula and or left lower lobe. 10/16 CT abd >> No abscess, evidence of infection. Extensive LLL and LUL lingula consolidation c/w necrosis, multifocal PNA 10/17 CXR >>  Persistent consolidation and effusion on the left. Small area of infiltrate lateral right base. No pneumothorax.    10/17 US Thoracentesis >> Successful ultrasound guided the left thoracentesis yielding 420 mL of pleural fluid.   CULTURES:  Blood 10/08 >> Gram pos rods >> 2/2 (clostridium) Sputum 10/08 >> oral flora  HIV 10/08 >> Non reactive  Pneumococcal Ag 10/09 >> POSITIVE  Legionella Ag 10/09 >> Negative  Quantiferon gold 10/11 >>  ANTIBIOTICS:  Cefepime 10/08 >> 10/10  Zosyn 10/08 >> 10/08  Vancomycin 10/08 >> 10/10  Meropenem 10/11 >> 10/17 Augmentin 10/17 -->  Code Status: Full  Family Communication: Pt at bedside  Disposition Plan: Home when medically ready    HPI/Subjective: No events overnight.   Objective: Filed Vitals:   10/19/12 0527 10/19/12 0925 10/19/12 0940 10/19/12 1439  BP: 123/67 99/49 95/54  107/67  Pulse: 86   89  Temp: 98.6 F (37 C)   98.8 F (37.1 C)  TempSrc: Oral   Oral  Resp: 20   18  Height:      Weight:      SpO2: 95%  96%    Intake/Output Summary (Last 24 hours) at 10/19/12 1700 Last data filed at 10/19/12 1300  Gross per 24 hour  Intake   1000 ml  Output   2280 ml  Net  -1280 ml    Exam:   General:  Pt is alert, follows commands appropriately, not in acute distress  Cardiovascular: Regular rate and rhythm, S1/S2, no murmurs, no rubs, no gallops  Respiratory: Clear to auscultation bilaterally, no wheezing, diminished breath  sounds at bases  Abdomen: Soft, non tender, non distended, bowel sounds present, no guarding  Extremities: No edema, pulses DP and PT palpable bilaterally  Neuro: Grossly nonfocal  Data Reviewed: Basic Metabolic Panel:  Recent Labs Lab 10/13/12 0354  10/15/12 0345 10/16/12 0510 10/17/12 0545 10/18/12 0440 10/19/12 0510  NA 129*  < > 134* 133* 133* 134* 134*  K 3.2*  < > 3.3* 3.7 3.8 3.5 3.8  CL 96  < > 99 100 99 100 101  CO2 24  < > 25 23 23 22 25   GLUCOSE 94  < > 135* 98 101* 76 99  BUN 7  < > 7 7 7 6 6   CREATININE 0.57  < > 0.43* 0.42* 0.42* 0.44* 0.51  CALCIUM 8.4  < > 8.3* 8.8 8.9 8.5 8.8  MG 1.9  --   --  1.9  --   --   --   PHOS 3.0  --   --  3.7  --   --   --   < > = values in this interval not displayed. Liver Function Tests:  Recent Labs Lab 10/18/12 0440 10/19/12 0510  AST 22  --   ALT 15  --   ALKPHOS 66  --   BILITOT 0.2*  --   PROT 6.0 6.4  ALBUMIN 2.0*  --    CBC:  Recent Labs Lab 10/14/12 0817 10/15/12 0345 10/16/12 0510 10/18/12 0440 10/19/12 0510  WBC 10.6* 9.9 11.9* 11.2* 13.1*  HGB 10.3* 9.8* 9.9* 10.4* 10.2*  HCT 31.0* 29.4* 30.0* 31.7* 31.2*  MCV 104.7* 103.5* 103.1* 102.6* 103.0*  PLT 416* 457* 541* 679* 782*    Recent Results (from the past 240 hour(s))  CULTURE, BLOOD (ROUTINE X 2)     Status: None   Collection Time    10/10/12  6:02 PM      Result Value Range Status   Specimen Description BLOOD RIGHT ANTECUBITAL   Final   Special Requests BOTTLES DRAWN AEROBIC AND ANAEROBIC 5CC   Final   Culture  Setup Time     Final   Value: 10/11/2012 02:20     Performed at [facility]   Culture     Final   Value: CLOSTRIDIUM SPECIES     Note: BETA LACTAMASE NEGATIVE     Note: Gram Stain Report Called to,Read Back By and Verified With: MEGAN STOCKS ON 10/11/2012 AT 10:28P BY WILEJ     Performed at [facility]   Report Status 10/19/2012 FINAL   Final  CULTURE, BLOOD (ROUTINE X 2)     Status: None   Collection Time     10/10/12  6:02 PM      Result Value Range Status   Specimen Description BLOOD LEFT ANTECUBITAL   Final   Special Requests BOTTLES DRAWN AEROBIC AND ANAEROBIC 5CC   Final   Culture  Setup Time     Final   Value: 10/11/2012 02:21     Performed at [facility]  Culture     Final   Value: CLOSTRIDIUM SPECIES     Note: Gram Stain Report Called to,Read Back By and Verified With: MEGAN STOCKS ON 10/11/2012 AT 10:28P BY WILEJ     Performed at [facility]   Report Status 10/18/2012 FINAL   Final  CULTURE, RESPIRATORY (NON-EXPECTORATED)     Status: None   Collection Time    10/10/12  9:46 PM      Result Value Range Status   Specimen Description SPUTUM   Final   Special Requests NONE   Final   Gram Stain     Final   Value: ABUNDANT WBC PRESENT, PREDOMINANTLY PMN     FEW SQUAMOUS EPITHELIAL CELLS PRESENT     ABUNDANT GRAM POSITIVE RODS     ABUNDANT GRAM POSITIVE COCCI IN PAIRS     FEW GRAM NEGATIVE RODS   Culture     Final   Value: NORMAL OROPHARYNGEAL FLORA     Performed at [facility]   Report Status 10/13/2012 FINAL   Final  MRSA PCR SCREENING     Status: None   Collection Time    10/10/12  9:47 PM      Result Value Range Status   MRSA by PCR NEGATIVE  NEGATIVE Final   Comment:            The GeneXpert MRSA Assay (FDA     approved for NASAL specimens     only), is one component of a     comprehensive MRSA colonization     surveillance program. It is not     intended to diagnose MRSA     infection nor to guide or     monitor treatment for     MRSA infections.  CULTURE, BLOOD (ROUTINE X 2)     Status: None   Collection Time    10/18/12  3:55 PM      Result Value Range Status   Specimen Description BLOOD LEFT HAND   Final   Special Requests BOTTLES DRAWN AEROBIC AND ANAEROBIC 10CC EACH   Final   Culture  Setup Time     Final   Value: 10/18/2012 20:36     Performed at [facility]   Culture     Final   Value:        BLOOD CULTURE RECEIVED  NO GROWTH TO DATE CULTURE WILL BE HELD FOR 5 DAYS BEFORE ISSUING A FINAL NEGATIVE REPORT     Performed at [facility]   Report Status PENDING   Incomplete  CULTURE, BLOOD (ROUTINE X 2)     Status: None   Collection Time    10/18/12  4:05 PM      Result Value Range Status   Specimen Description BLOOD LEFT ARM   Final   Special Requests BOTTLES DRAWN AEROBIC AND ANAEROBIC 10CC EACH   Final   Culture  Setup Time     Final   Value: 10/18/2012 20:36     Performed at [facility]   Culture     Final   Value:        BLOOD CULTURE RECEIVED NO GROWTH TO DATE CULTURE WILL BE HELD FOR 5 DAYS BEFORE ISSUING A FINAL NEGATIVE REPORT     Performed at [facility]   Report Status PENDING   Incomplete  BODY FLUID CULTURE     Status: None   Collection Time    10/19/12  9:32 AM  Result Value Range Status   Specimen Description PLEURAL LEFT   Final   Special Requests NONE   Final   Gram Stain     Final   Value: FEW WBC PRESENT,BOTH PMN AND MONONUCLEAR     NO ORGANISMS SEEN     Performed at [facility]   Culture PENDING   Incomplete   Report Status PENDING   Incomplete     Scheduled Meds: . amoxicillin-clavulanate  1 tablet Oral Q12H  . famotidine  20 mg Oral QHS  . folic acid  1 mg Oral Daily  . multivitamin with minerals  1 tablet Oral Daily  . thiamine  500 mg Oral Daily   Continuous Infusions:   Debbora Presto, MD  [facility] Pager [phone]  If 7PM-7AM, please contact night-coverage [email or web address] Password [email or web address] 10/19/2012, 5:00 PM   LOS: 9 days

## 2012-10-19 NOTE — Procedures (Signed)
Successful US guided left thoracentesis. Yielded of hazy amber colored fluid. Pt tolerated procedure well. No immediate complications.  Specimen was sent for labs. CXR ordered.  Brayton El PA-C 10/19/2012 9:42 AM

## 2012-10-19 NOTE — Progress Notes (Signed)
ANTIBIOTIC CONSULT NOTE - Follow up  Pharmacy Consult for meropenem Indication: Clostridial bacteremia, HCAP  No Known Allergies  Patient Measurements: Height: 5\' 9"  (175.3 cm) Weight: 141 lb 1.5 oz (64 kg) IBW/kg (Calculated) : 70.7  Vital Signs: Temp: 98.6 F (37 C) (10/17 0527) Temp src: Oral (10/17 0527) BP: 123/67 mmHg (10/17 0527) Pulse Rate: 86 (10/17 0527) Intake/Output from previous day: 10/16 0701 - 10/17 0700 In: 1240 [P.O.:540; IV Piggyback:700] Out: 2100 [Urine:2100] Intake/Output from this shift:    Labs:  Recent Labs  10/17/12 0545 10/18/12 0440 10/19/12 0510  WBC  --  11.2* 13.1*  HGB  --  10.4* 10.2*  PLT  --  679* 782*  CREATININE 0.42* 0.44* 0.51   Estimated Creatinine Clearance: 106.7 ml/min (by C-G formula based on Cr of 0.51).  Microbiology: 10/8 blood x2: 2/2 GNR 10/8 MRSA pcr: negative 10/9 sputum: nml oropharyngeal flora- final 10/9 S. Pneumonia Ag: POSITIVE 10/9 Legionella Ag: negative  Assessment: 30 yoM with hx EtOH abuse admitted 10/8 with progressively worsening SOB, associated with fevers, chills, nausea, and poor PO intake, found to have multifocal PNA. Pt with recent admission 8/28-9/5 for acute alcohol intoxication and seizures, also on CT shown to have pancolitis, ileitis.  Pt initially treated with vancomycin and cefepime for HCAP and antibiotics changed on 10/11 to include addition of anaerobic coverage. Primaxin has not been added due to a Hx of seizures per CCM MD note 10/11. ID now following.   D#7 Meropenem (D#10 Abx total) Tm afebrile WBC elevated at 13.2 SCr stable; CrCl ~ 100 ml/min  Microbiology: 10/8 blood x2: 2/2 Clostridium species - pending sensitivities 10/8 MRSA pcr: negative 10/9 sputum: nml oropharyngeal flora- final 10/9 S. Pneumonia Ag: POSITIVE 10/9 Legionella Ag: negative 10/16 blood x 2: collected   Goal of Therapy:  Appropriate abx dosing, eradication of infection.   Plan:   Continue  meropenem 1g IV q8  Follow-up clinical course, culture results, renal function  Follow-up antibiotic de-escalation and length of therapy  Thank you for the consult.  Haynes Hoehn, PharmD 10/19/2012, 7:47 AM  Pager: [phone]

## 2012-10-19 NOTE — Progress Notes (Signed)
    [facility]   NOTE I changed the pt back to IV antibiotics tonight just to get a few more doses of IV into him prior to DC He has had 7 days of IV therapy so far. I would like to give him more doses while he is an inpatient. At DC I  Would give him rx for augmentin for one month with refills.  I did not choose invanz because of greater  Spectrum than unasyn/augmentin  but because it is QDAily and we can get a few more doses in prior to DC

## 2012-10-19 NOTE — Progress Notes (Signed)
PULMONARY  / CRITICAL CARE MEDICINE  Name: Eugene Jackson MRN: [id number] DOB: August 20, 1970    ADMISSION DATE:  10/10/2012 CONSULTATION DATE:  10/13/2012  REFERRING MD : Elvera Lennox  CHIEF COMPLAINT: Cough  BRIEF PATIENT DESCRIPTION:  41 yo male smoker presented with dyspnea, fever, chills, nausea and productive cough.  Noted to have acute hypoxic respiratory failure and hyponatremia from HCAP and ETOH.  Developed hemoptysis 10/11 and PCCM consulted.  SIGNIFICANT EVENTS: 10/08 Admit 10/11 Hemoptysis >> PCCM consulted 10-14 continued hemoptysis STUDIES:  10/11 CT chest >> LLL, lingular, Lt upper lobe consolidation; patchy RUL and RML infiltrate; two lesions RLL mass vs consolidation; small b/l effusions 10/12 Echo >> EF 55%, grade 1 diastolic dysfx  10-17 left thora 420 cc of fluid LINES / TUBES: PIV  CULTURES: Blood 10/08 >> clostridium 2/2 Sputum 10/08 >> oral flora HIV 10/08 >> Non reactive Pneumococcal Ag 10/09 >> POSITIVE  Legionella Ag 10/09 >> Negative Quantiferon gold 10/11 >> neg 10-17 pleural fluid>>  ANTIBIOTICS: Cefepime 10/08 >> 10/10 Zosyn 10/08 >> 10/08  Vancomycin 10/08 >> 10/10 Meropenem 10/11 >>   SUBJECTIVE:  Still coughing up blood, but less amount. Looks better  VITAL SIGNS: Temp:  [98.6 F (37 C)-99.9 F (37.7 C)] 98.6 F (37 C) (10/17 0527) Pulse Rate:  [86-96] 86 (10/17 0527) Resp:  [20] 20 (10/17 0527) BP: (95-142)/(49-74) 95/54 mmHg (10/17 0940) SpO2:  [95 %-96 %] 95 % (10/17 0527) Room air  INTAKE / OUTPUT: Intake/Output     10/16 0701 - 10/17 0700 10/17 0701 - 10/18 0700   P.O. 540 120   IV Piggyback 700    Total Intake(mL/kg) 1240 (19.4) 120 (1.9)   Urine (mL/kg/hr) 2100 (1.4) 580 (2.6)   Total Output 2100 580   Net -860 -460        Urine Occurrence  1 x   Stool Occurrence  1 x     PHYSICAL EXAMINATION: General: No distress,cointinued hempotysis , old dark blodd. Neuro:  Alert, normal strength, strange affect HEENT:  No  sinus tenderness Cardiovascular:  Regular, no murmur Lungs:  no rhonchi, no wheeze, decreased left base Abdomen:  Soft, non tender Musculoskeletal:  No edema Skin:  No rashes  LABS:  CBC Recent Labs     10/18/12  0440  10/19/12  0510  WBC  11.2*  13.1*  HGB  10.4*  10.2*  HCT  31.7*  31.2*  PLT  679*  782*   Coag's No results found for this basename: APTT, INR,  in the last 72 hours BMET Recent Labs     10/17/12  0545  10/18/12  0440  10/19/12  0510  NA  133*  134*  134*  K  3.8  3.5  3.8  CL  99  100  101  CO2  23  22  25   BUN  7  6  6   CREATININE  0.42*  0.44*  0.51  GLUCOSE  101*  76  99   Electrolytes Recent Labs     10/17/12  0545  10/18/12  0440  10/19/12  0510  CALCIUM  8.9  8.5  8.8   Imaging Dg Chest 1 View  10/19/2012   CLINICAL DATA:  Post thoracentesis  EXAM: CHEST - 1 VIEW  COMPARISON:  October 17, 2012  FINDINGS: There is no pneumothorax. There is persistent elevation of the left hemidiaphragm with consolidation effusion on the left. There is a small area of infiltrate in the lateral right base, stable.  There is no new opacity. Heart size and pulmonary vascularity are normal. No apparent adenopathy.  IMPRESSION: Persistent consolidation and effusion on the left. Small area of infiltrate lateral right base. No pneumothorax.   Electronically Signed   By: Bretta Bang M.D.   On: 10/19/2012 09:50   Dg Chest Bilateral Decubitus  10/17/2012   CLINICAL DATA:  Left parapneumonic pleural effusion. Weakness.  EXAM: CHEST - BILATERAL DECUBITUS VIEW  COMPARISON:  CHEST x-ray 10/17/2012.  FINDINGS: Bilateral decubitus views of the chest demonstrate some layering of the moderate to large left pleural effusion on the left lateral decubitus view. However, on the right lateral decubitus view, the effusion fails to layer along the medial aspect of the left hemithorax, which suggests at least partial loculation of the fluid. Extensive atelectasis and or consolidation  in the base of the left lung is unchanged.  IMPRESSION: 1. Moderate to large left pleural effusion appears slightly complex. There is likely a freely flowing component as well as a partially loculated component to this parapneumonic effusion.   Electronically Signed   By: Trudie Reed M.D.   On: 10/17/2012 16:13   Ct Abdomen Pelvis W Contrast  10/18/2012   CLINICAL DATA:  Clostridium bacteremia. Evaluate for abscess.  EXAM: CT ABDOMEN AND PELVIS WITH CONTRAST  TECHNIQUE: Multidetector CT imaging of the abdomen and pelvis was performed using the standard protocol following bolus administration of intravenous contrast.  CONTRAST:  OMNIPAQUE IOHEXOL 300 MG/ML  SOLN  COMPARISON:  08/30/2012  FINDINGS: There is dense consolidation, both ground-glass and more dense airspace opacity, in the left lower lobe with patchy areas of additional consolidation in the left upper lobe lingula and small areas of ground-glass and focal opacity in the right middle lobe and right lower lobe. Small left a minimal right pleural effusions are noted. This is consistent with multifocal pneumonia.  Normal liver, spleen, gallbladder and pancreas. No bile duct dilation. There are no adrenal masses.  Normal kidneys, ureters and bladder.  Atherosclerotic plaque is noted along the abdominal aorta and iliac arteries. No pathologically enlarged lymph nodes are seen. There are no abnormal fluid collections.  Mild increased stool is noted throughout the colon. No bowel wall thickening or inflammatory changes are seen. The small bowel is unremarkable. A normal appendix is visualized.  Bilateral hip prostheses are well-seated and aligned. There are degenerative changes noted of the visualized spine. No osteoblastic or osteolytic lesions.  IMPRESSION: 1. No abscess or evidence of infection below the diaphragm. 2. Extensive left lower lobe and left upper lobe lingula consolidation. Not mentioned above, there is an area of cystic change within  the lingula consistent with necrosis. Milder areas of airspace opacity are noted in the right middle and lower lobes. Small left a minimal right pleural effusions. This is presumed to be multifocal pneumonia. 3. Mild increased stool noted throughout the colon. No bowel inflammatory changes. A normal appendix is visualized. 4. Well-positioned bilateral hip prostheses. Degenerative changes are noted of the visualized spine and there is atherosclerosis along the abdominal aorta and iliac arteries.   Electronically Signed   By: Amie Portland M.D.   On: 10/18/2012 21:38   Principal Problem:   HCAP (healthcare-associated pneumonia) Active Problems:   Alcohol abuse   Hyponatremia   Anemia of chronic disease   Abnormal liver function tests   Moderate protein-calorie malnutrition   Acute respiratory failure   Bacteremia   Hemoptysis   Tobacco abuse   Parapneumonic effusion  ASSESSMENT / PLAN:  A: 42 yo male smoker with HCAP in setting of ETOH and recent hospital admission.  urine pneumococcal antigen positive. Necrotizing pneumonia with left parapneumonic effusion - no rsidual fluid on CT after thora  Quantiferon gold assay from 10/11  neg P: -Treat as abscess with augmentin, will need longer course x 3-4 wks depending on radiographic resolution -Pro biotics, high risk for C diff -FU appt made in 1 wk -needs to stop smoking -ID input appreciated -PCCM to sign off, can discharge with outpt FU  Cyril Mourning MD. [facility]. [facility] Pager [phone] If no response call [phone]   10/19/2012, 10:32 AM

## 2012-10-19 NOTE — Progress Notes (Signed)
Physical Therapy Treatment Patient Details Name: Eugene Jackson MRN: [id number] DOB: 1970/04/17 Today's Date: 10/19/2012 Time: 4098-1191 PT Time Calculation (min): 12 min  PT Assessment / Plan / Recommendation  History of Present Illness Pt is 42 yo male with history of alcohol abuse and seizures, HTN who presents to [facility] ED with main concern of progressively worsening shortness of breath, initially present with exertion and now at rest, associated with subjective fevers, chills, nausea and poor oral intake, productive cough of clear to yellow sputum.   PT Comments   Pt OOB in recliner.  Max encouragement to participate.  Amb in hallway with RW.   Follow Up Recommendations  No PT follow up     Does the patient have the potential to tolerate intense rehabilitation     Barriers to Discharge        Equipment Recommendations       Recommendations for Other Services    Frequency Min 3X/week   Progress towards PT Goals Progress towards PT goals: Progressing toward goals  Plan      Precautions / Restrictions Precautions Precautions: Fall Restrictions Weight Bearing Restrictions: No    Pertinent Vitals/Pain No c/o pain    Mobility  Bed Mobility Bed Mobility: Not assessed Details for Bed Mobility Assistance: Pt OOB in recliner Transfers Transfers: Sit to Stand;Stand to Sit Sit to Stand: 6: Modified independent (Device/Increase time);5: Supervision;From chair/3-in-1 Stand to Sit: 6: Modified independent (Device/Increase time);5: Supervision;To chair/3-in-1 Details for Transfer Assistance: good use of hands and increased time Ambulation/Gait Ambulation/Gait Assistance: 5: Supervision;4: Min guard Ambulation Distance (Feet): 220 Feet Assistive device: Rolling walker Ambulation/Gait Assistance Details: <25% VC's on safety with turns and backward gait.   Gait Pattern: Step-through pattern;Decreased stride length;Trunk flexed Gait velocity: decreased     PT Goals (current  goals can now be found in the care plan section)    Visit Information  Last PT Received On: 10/19/12 Assistance Needed: +1 History of Present Illness: Pt is 42 yo male with history of alcohol abuse and seizures, HTN who presents to [facility] ED with main concern of progressively worsening shortness of breath, initially present with exertion and now at rest, associated with subjective fevers, chills, nausea and poor oral intake, productive cough of clear to yellow sputum.    Subjective Data      Cognition       Balance     End of Session PT - End of Session Equipment Utilized During Treatment: Gait belt Activity Tolerance: Patient limited by fatigue Patient left: in chair;with chair alarm set;with call bell/phone within reach   Felecia Shelling  PTA [facility]  Acute  Rehab Pager      [phone]

## 2012-10-20 NOTE — Progress Notes (Signed)
Patient ID: Eugene Jackson, male   DOB: 02/27/1970, 42 y.o.   MRN: [id number] [facility] PROGRESS NOTE  JESSICA SEIDMAN WUJ:[id number] DOB: [birth year] DOA: 10/10/2012 PCP: Nonnie Done., MD  Brief narrative:  42 yo male smoker presented with dyspnea, fever, chills, nausea and productive cough on 10/10/2012. Noted to have acute hypoxic respiratory failure and hyponatremia from HCAP and ETOH. Developed hemoptysis 10/11 and PCCM consulted.   Principal Problem:  Acute respiratory failure  - secondary to HCAP and pleural effusion  - pt maintaining oxygen saturations at target range  - Chest x-ray 10/17/2012 with moderate to large left pleural effusion which appears complex, questionable parapneumonic effusion  - status post therapeutic thoracentesis 10/17 and doing well, clinically stable  - follow up on fluid analysis, results pending   HCAP (healthcare-associated pneumonia)  - CT 10/11 with worsening multifocal pneumonia.  - pneumococcal antigen +  - ABX changed to IV again for several more days and plan on transitioning to oral upon discharge  Active Problems:  GPR bacteremia  - on Meropenem. No history of drug resistant organisms, awaiting speciation.  - ABX changed to iV - appreciate ID input  = plan on d/c in AM Hyponatremia  - stable overall ~134  - BMP in AM  Anemia of chronic disease  - alcohol induced bone marrow damage  - Hg/Hct stable and at baseline  - CBC in AM  Moderate protein-calorie malnutrition  - secondary to acute illness imposed on alcohol abuse and poor oral intake  - encouraged PO intake  Abnormal liver function tests  - secondary to alcohol induced liver damage  - within normal limits 10/18/2012  Tobacco abuse  - cessation discussed in detail  Alcohol abuse  - improved, continue to keep on CIWA protocol  - cessation discussed in detail  Thrombocytosis  - unclear etiology  - monitor closely  DVT Prophylaxis - SCDs   Consultants:  PCCM   ID Procedures/Studies:  10/11 CT chest >> LLL, lingular, Lt upper lobe consolidation; patchy RUL and RML infiltrate; two lesions RLL mass vs consolidation; small b/l effusions  10/12 Echo >> EF 55%, grade 1 diastolic dysfx  10/15 CXR >> Stable left lung opacity consistent with pneumonia or atelectasis with associated pleural effusion.  10/14 CXR >> Worsening of pneumonia left greater than right with possible cavitation in the region of the lingula and or left lower lobe.  10/16 CT abd >> No abscess, evidence of infection. Extensive LLL and LUL lingula consolidation c/w necrosis, multifocal PNA  10/17 CXR >> Persistent consolidation and effusion on the left. Small area of infiltrate lateral right base. No pneumothorax.  10/17 US Thoracentesis >> Successful ultrasound guided the left thoracentesis yielding 420 mL of pleural fluid.  CULTURES:  Blood 10/08 >> Gram pos rods >> 2/2 (clostridium)  Sputum 10/08 >> oral flora  HIV 10/08 >> Non reactive  Pneumococcal Ag 10/09 >> POSITIVE  Legionella Ag 10/09 >> Negative  Quantiferon gold 10/11 >>  ANTIBIOTICS:  Cefepime 10/08 >> 10/10  Zosyn 10/08 >> 10/08  Vancomycin 10/08 >> 10/10  Meropenem 10/11 >> 10/17  Augmentin 10/17 -->   Code Status: Full  Family Communication: Pt at bedside  Disposition Plan: Home Monday   HPI/Subjective: No events overnight.   Objective: Filed Vitals:   10/19/12 1439 10/19/12 2146 10/20/12 0612 10/20/12 1332  BP: 107/67 115/69 115/68 104/56  Pulse: 89 86 80 84  Temp: 98.8 F (37.1 C) 98.6 F (37 C) 98.9 F (37.2 C) 98.8  F (37.1 C)  TempSrc: Oral Oral Oral Oral  Resp: 18 24 18 20   Height:      Weight:      SpO2: 96% 97% 97% 98%    Intake/Output Summary (Last 24 hours) at 10/20/12 1721 Last data filed at 10/20/12 1300  Gross per 24 hour  Intake    710 ml  Output   1275 ml  Net   -565 ml    Exam:   General:  Pt is alert, follows commands appropriately, not in acute  distress  Cardiovascular: Regular rate and rhythm, S1/S2, no murmurs, no rubs, no gallops  Respiratory: Clear to auscultation bilaterally, no wheezing, diminished breath sounds on the left side   Abdomen: Soft, non tender, non distended, bowel sounds present, no guarding  Extremities: No edema, pulses DP and PT palpable bilaterally  Neuro: Grossly nonfocal  Data Reviewed: Basic Metabolic Panel:  Recent Labs Lab 10/15/12 0345 10/16/12 0510 10/17/12 0545 10/18/12 0440 10/19/12 0510  NA 134* 133* 133* 134* 134*  K 3.3* 3.7 3.8 3.5 3.8  CL 99 100 99 100 101  CO2 25 23 23 22 25   GLUCOSE 135* 98 101* 76 99  BUN 7 7 7 6 6   CREATININE 0.43* 0.42* 0.42* 0.44* 0.51  CALCIUM 8.3* 8.8 8.9 8.5 8.8  MG  --  1.9  --   --   --   PHOS  --  3.7  --   --   --    Liver Function Tests:  Recent Labs Lab 10/18/12 0440 10/19/12 0510  AST 22  --   ALT 15  --   ALKPHOS 66  --   BILITOT 0.2*  --   PROT 6.0 6.4  ALBUMIN 2.0*  --    CBC:  Recent Labs Lab 10/14/12 0817 10/15/12 0345 10/16/12 0510 10/18/12 0440 10/19/12 0510  WBC 10.6* 9.9 11.9* 11.2* 13.1*  HGB 10.3* 9.8* 9.9* 10.4* 10.2*  HCT 31.0* 29.4* 30.0* 31.7* 31.2*  MCV 104.7* 103.5* 103.1* 102.6* 103.0*  PLT 416* 457* 541* 679* 782*     Recent Results (from the past 240 hour(s))  CULTURE, BLOOD (ROUTINE X 2)     Status: None   Collection Time    10/10/12  6:02 PM      Result Value Range Status   Specimen Description BLOOD RIGHT ANTECUBITAL   Final   Special Requests BOTTLES DRAWN AEROBIC AND ANAEROBIC 5CC   Final   Culture  Setup Time     Final   Value: 10/11/2012 02:20     Performed at [facility]   Culture     Final   Value: CLOSTRIDIUM SPECIES     Note: BETA LACTAMASE NEGATIVE     Note: Gram Stain Report Called to,Read Back By and Verified With: MEGAN STOCKS ON 10/11/2012 AT 10:28P BY WILEJ     Performed at [facility]   Report Status 10/19/2012 FINAL   Final  CULTURE, BLOOD (ROUTINE X 2)      Status: None   Collection Time    10/10/12  6:02 PM      Result Value Range Status   Specimen Description BLOOD LEFT ANTECUBITAL   Final   Special Requests BOTTLES DRAWN AEROBIC AND ANAEROBIC 5CC   Final   Culture  Setup Time     Final   Value: 10/11/2012 02:21     Performed at [facility]   Culture     Final   Value: CLOSTRIDIUM SPECIES  Note: Gram Stain Report Called to,Read Back By and Verified With: MEGAN STOCKS ON 10/11/2012 AT 10:28P BY WILEJ     Performed at [facility]   Report Status 10/18/2012 FINAL   Final  CULTURE, RESPIRATORY (NON-EXPECTORATED)     Status: None   Collection Time    10/10/12  9:46 PM      Result Value Range Status   Specimen Description SPUTUM   Final   Special Requests NONE   Final   Gram Stain     Final   Value: ABUNDANT WBC PRESENT, PREDOMINANTLY PMN     FEW SQUAMOUS EPITHELIAL CELLS PRESENT     ABUNDANT GRAM POSITIVE RODS     ABUNDANT GRAM POSITIVE COCCI IN PAIRS     FEW GRAM NEGATIVE RODS   Culture     Final   Value: NORMAL OROPHARYNGEAL FLORA     Performed at [facility]   Report Status 10/13/2012 FINAL   Final  MRSA PCR SCREENING     Status: None   Collection Time    10/10/12  9:47 PM      Result Value Range Status   MRSA by PCR NEGATIVE  NEGATIVE Final   Comment:            The GeneXpert MRSA Assay (FDA     approved for NASAL specimens     only), is one component of a     comprehensive MRSA colonization     surveillance program. It is not     intended to diagnose MRSA     infection nor to guide or     monitor treatment for     MRSA infections.  CULTURE, BLOOD (ROUTINE X 2)     Status: None   Collection Time    10/18/12  3:55 PM      Result Value Range Status   Specimen Description BLOOD LEFT HAND   Final   Special Requests BOTTLES DRAWN AEROBIC AND ANAEROBIC 10CC EACH   Final   Culture  Setup Time     Final   Value: 10/18/2012 20:36     Performed at [facility]   Culture     Final    Value:        BLOOD CULTURE RECEIVED NO GROWTH TO DATE CULTURE WILL BE HELD FOR 5 DAYS BEFORE ISSUING A FINAL NEGATIVE REPORT     Performed at [facility]   Report Status PENDING   Incomplete  CULTURE, BLOOD (ROUTINE X 2)     Status: None   Collection Time    10/18/12  4:05 PM      Result Value Range Status   Specimen Description BLOOD LEFT ARM   Final   Special Requests BOTTLES DRAWN AEROBIC AND ANAEROBIC 10CC EACH   Final   Culture  Setup Time     Final   Value: 10/18/2012 20:36     Performed at [facility]   Culture     Final   Value:        BLOOD CULTURE RECEIVED NO GROWTH TO DATE CULTURE WILL BE HELD FOR 5 DAYS BEFORE ISSUING A FINAL NEGATIVE REPORT     Performed at [facility]   Report Status PENDING   Incomplete  FUNGUS CULTURE W SMEAR     Status: None   Collection Time    10/19/12  9:23 AM      Result Value Range Status   Specimen Description PLEURAL LEFT  Final   Special Requests NONE   Final   Fungal Smear     Final   Value: NO YEAST OR FUNGAL ELEMENTS SEEN     Performed at [facility]   Culture     Final   Value: CULTURE IN PROGRESS FOR FOUR WEEKS     Performed at [facility]   Report Status PENDING   Incomplete  AFB CULTURE WITH SMEAR     Status: None   Collection Time    10/19/12  9:32 AM      Result Value Range Status   Specimen Description PLEURAL LEFT   Final   Special Requests NONE   Final   ACID FAST SMEAR     Final   Value: NO ACID FAST BACILLI SEEN     Performed at [facility]   Culture     Final   Value: CULTURE WILL BE EXAMINED FOR 6 WEEKS BEFORE ISSUING A FINAL REPORT     Performed at [facility]   Report Status PENDING   Incomplete  BODY FLUID CULTURE     Status: None   Collection Time    10/19/12  9:32 AM      Result Value Range Status   Specimen Description PLEURAL LEFT   Final   Special Requests NONE   Final   Gram Stain     Final   Value: FEW WBC PRESENT,BOTH PMN AND  MONONUCLEAR     NO ORGANISMS SEEN     Performed at [facility]   Culture     Final   Value: NO GROWTH 1 DAY     Performed at [facility]   Report Status PENDING   Incomplete     Scheduled Meds: . ertapenem  1 g Intravenous Q24H  . famotidine  20 mg Oral QHS  . folic acid  1 mg Oral Daily  . multivitamin with minerals  1 tablet Oral Daily  . thiamine  500 mg Oral Daily   Continuous Infusions:    Debbora Presto, MD  [facility] Pager [phone]  If 7PM-7AM, please contact night-coverage [email or web address] Password [email or web address] 10/20/2012, 5:21 PM   LOS: 10 days

## 2012-10-21 LAB — BASIC METABOLIC PANEL
BUN: 7 mg/dL (ref 6–23)
CO2: 24 mEq/L (ref 19–32)
Chloride: 100 mEq/L (ref 96–112)
Creatinine, Ser: 0.52 mg/dL (ref 0.50–1.35)
GFR calc non Af Amer: >90 mL/min (ref >90)
Potassium: 4.3 mEq/L (ref 3.5–5.1)

## 2012-10-21 LAB — CBC
HCT: 32 % — ABNORMAL LOW (ref 39.0–52.0)
MCV: 102.6 fL — ABNORMAL HIGH (ref 78.0–100.0)
RBC: 3.12 MIL/uL — ABNORMAL LOW (ref 4.22–5.81)
WBC: 12.5 10*3/uL — ABNORMAL HIGH (ref 4.0–10.5)

## 2012-10-21 NOTE — Progress Notes (Signed)
Patient ID: Eugene Jackson, male   DOB: 1970-01-24, 42 y.o.   MRN: [id number] [facility] PROGRESS NOTE  RIVER AMBROSIO WUJ:[id number] DOB: Jul 18, 1970 DOA: 10/10/2012 PCP: Nonnie Done., MD  Brief narrative:  42 yo male smoker presented with dyspnea, fever, chills, nausea and productive cough on 10/10/2012. Noted to have acute hypoxic respiratory failure and hyponatremia from HCAP and ETOH. Developed hemoptysis 10/11 and PCCM consulted.   Principal Problem:  Acute respiratory failure  - secondary to HCAP and pleural effusion  - pt maintaining oxygen saturations at target range  - Chest x-ray 10/17/2012 with moderate to large left pleural effusion which appears complex, questionable parapneumonic effusion  - status post therapeutic thoracentesis 10/17 and doing well, clinically stable, 480 cc of fluid removed   - follow up on fluid analysis HCAP (healthcare-associated pneumonia)  - CT 10/11 with worsening multifocal pneumonia.  - pneumococcal antigen +  - ABX changed to IV Ertapenem 10/18 and plan on transitioning to oral upon discharge  Active Problems:  GPR bacteremia  - on Meropenem initially. No history of drug resistant organisms, awaiting speciation.  - ABX changed to IV again 10/18 (changed to Ertapenem) - appreciate ID input  - plan on d/c in AM  Hyponatremia  - stable overall - BMP in AM  Anemia of chronic disease  - alcohol induced bone marrow damage  - Hg/Hct stable and at baseline  - CBC in AM  Moderate protein-calorie malnutrition  - secondary to acute illness imposed on alcohol abuse and poor oral intake  - encouraged PO intake  Abnormal liver function tests  - secondary to alcohol induced liver damage  - within normal limits 10/18/2012  Tobacco abuse  - cessation discussed in detail  Alcohol abuse  - improved, continue to keep on CIWA protocol  - cessation discussed in detail  Thrombocytosis  - unclear etiology  - monitor closely  - hematology  consultation  DVT Prophylaxis - SCDs  Consultants:  PCCM  ID Procedures/Studies:  10/11 CT chest >> LLL, lingular, Lt upper lobe consolidation; patchy RUL and RML infiltrate; two lesions RLL mass vs consolidation; small b/l effusions  10/12 Echo >> EF 55%, grade 1 diastolic dysfx  10/15 CXR >> Stable left lung opacity consistent with pneumonia or atelectasis with associated pleural effusion.  10/14 CXR >> Worsening of pneumonia left greater than right with possible cavitation in the region of the lingula and or left lower lobe.  10/16 CT abd >> No abscess, evidence of infection. Extensive LLL and LUL lingula consolidation c/w necrosis, multifocal PNA  10/17 CXR >> Persistent consolidation and effusion on the left. Small area of infiltrate lateral right base. No pneumothorax.  10/17 US Thoracentesis >> Successful ultrasound guided the left thoracentesis yielding 420 mL of pleural fluid.  CULTURES:  Blood 10/08 >> Gram pos rods >> 2/2 (clostridium)  Sputum 10/08 >> oral flora  HIV 10/08 >> Non reactive  Pneumococcal Ag 10/09 >> POSITIVE  Legionella Ag 10/09 >> Negative  Quantiferon gold 10/11 >>  ANTIBIOTICS:  Cefepime 10/08 >> 10/10  Zosyn 10/08 >> 10/08  Vancomycin 10/08 >> 10/10  Meropenem 10/11 >> 10/17  Augmentin 10/17 --> 10/18 Ertapenem 10/18 -->   Code Status: Full  Family Communication: Pt at bedside  Disposition Plan: Home Monday    HPI/Subjective: No events overnight.   Objective: Filed Vitals:   10/20/12 0612 10/20/12 1332 10/20/12 2210 10/21/12 0500  BP: 115/68 104/56 120/68 109/67  Pulse: 80 84 76 74  Temp: 98.9  F (37.2 C) 98.8 F (37.1 C) 98.6 F (37 C) 98.6 F (37 C)  TempSrc: Oral Oral Oral Oral  Resp: 18 20 16 20   Height:      Weight:      SpO2: 97% 98% 98% 97%    Intake/Output Summary (Last 24 hours) at 10/21/12 0936 Last data filed at 10/21/12 1610  Gross per 24 hour  Intake    410 ml  Output   1650 ml  Net  -1240 ml     Exam:   General:  Pt is alert, follows commands appropriately, not in acute distress  Cardiovascular: Regular rate and rhythm, S1/S2, no murmurs, no rubs, no gallops  Respiratory: Clear to auscultation bilaterally, no wheezing, diminished air movement on the left   Abdomen: Soft, non tender, non distended, bowel sounds present, no guarding  Extremities: No edema, pulses DP and PT palpable bilaterally  Neuro: Grossly nonfocal  Data Reviewed: Basic Metabolic Panel:  Recent Labs Lab 10/16/12 0510 10/17/12 0545 10/18/12 0440 10/19/12 0510 10/21/12 0410  NA 133* 133* 134* 134* 135  K 3.7 3.8 3.5 3.8 4.3  CL 100 99 100 101 100  CO2 23 23 22 25 24   GLUCOSE 98 101* 76 99 95  BUN 7 7 6 6 7   CREATININE 0.42* 0.42* 0.44* 0.51 0.52  CALCIUM 8.8 8.9 8.5 8.8 9.5  MG 1.9  --   --   --   --   PHOS 3.7  --   --   --   --    Liver Function Tests:  Recent Labs Lab 10/18/12 0440 10/19/12 0510  AST 22  --   ALT 15  --   ALKPHOS 66  --   BILITOT 0.2*  --   PROT 6.0 6.4  ALBUMIN 2.0*  --    CBC:  Recent Labs Lab 10/15/12 0345 10/16/12 0510 10/18/12 0440 10/19/12 0510 10/21/12 0410  WBC 9.9 11.9* 11.2* 13.1* 12.5*  HGB 9.8* 9.9* 10.4* 10.2* 10.4*  HCT 29.4* 30.0* 31.7* 31.2* 32.0*  MCV 103.5* 103.1* 102.6* 103.0* 102.6*  PLT 457* 541* 679* 782* 848*    Recent Results (from the past 240 hour(s))  CULTURE, BLOOD (ROUTINE X 2)     Status: None   Collection Time    10/18/12  3:55 PM      Result Value Range Status   Specimen Description BLOOD LEFT HAND   Final   Special Requests BOTTLES DRAWN AEROBIC AND ANAEROBIC 10CC EACH   Final   Culture  Setup Time     Final   Value: 10/18/2012 20:36     Performed at [facility]   Culture     Final   Value:        BLOOD CULTURE RECEIVED NO GROWTH TO DATE CULTURE WILL BE HELD FOR 5 DAYS BEFORE ISSUING A FINAL NEGATIVE REPORT     Performed at [facility]   Report Status PENDING   Incomplete  CULTURE, BLOOD  (ROUTINE X 2)     Status: None   Collection Time    10/18/12  4:05 PM      Result Value Range Status   Specimen Description BLOOD LEFT ARM   Final   Special Requests BOTTLES DRAWN AEROBIC AND ANAEROBIC 10CC EACH   Final   Culture  Setup Time     Final   Value: 10/18/2012 20:36     Performed at [facility]   Culture     Final  Value:        BLOOD CULTURE RECEIVED NO GROWTH TO DATE CULTURE WILL BE HELD FOR 5 DAYS BEFORE ISSUING A FINAL NEGATIVE REPORT     Performed at [facility]   Report Status PENDING   Incomplete  FUNGUS CULTURE W SMEAR     Status: None   Collection Time    10/19/12  9:23 AM      Result Value Range Status   Specimen Description PLEURAL LEFT   Final   Special Requests NONE   Final   Fungal Smear     Final   Value: NO YEAST OR FUNGAL ELEMENTS SEEN     Performed at [facility]   Culture     Final   Value: CULTURE IN PROGRESS FOR FOUR WEEKS     Performed at [facility]   Report Status PENDING   Incomplete  AFB CULTURE WITH SMEAR     Status: None   Collection Time    10/19/12  9:32 AM      Result Value Range Status   Specimen Description PLEURAL LEFT   Final   Special Requests NONE   Final   ACID FAST SMEAR     Final   Value: NO ACID FAST BACILLI SEEN     Performed at [facility]   Culture     Final   Value: CULTURE WILL BE EXAMINED FOR 6 WEEKS BEFORE ISSUING A FINAL REPORT     Performed at [facility]   Report Status PENDING   Incomplete  BODY FLUID CULTURE     Status: None   Collection Time    10/19/12  9:32 AM      Result Value Range Status   Specimen Description PLEURAL LEFT   Final   Special Requests NONE   Final   Gram Stain     Final   Value: FEW WBC PRESENT,BOTH PMN AND MONONUCLEAR     NO ORGANISMS SEEN     Performed at [facility]   Culture     Final   Value: NO GROWTH 1 DAY     Performed at [facility]   Report Status PENDING   Incomplete     Scheduled Meds: .  ertapenem  1 g Intravenous Q24H  . famotidine  20 mg Oral QHS  . folic acid  1 mg Oral Daily  . multivitamin with minerals  1 tablet Oral Daily  . thiamine  500 mg Oral Daily   Continuous Infusions:   Debbora Presto, MD  [facility] Pager [phone]  If 7PM-7AM, please contact night-coverage [email or web address] Password [email or web address] 10/21/2012, 9:36 AM   LOS: 11 days

## 2012-10-22 LAB — BODY FLUID CULTURE

## 2012-10-22 MED ORDER — AMOXICILLIN-POT CLAVULANATE 875-125 MG PO TABS: 1.0000 | ORAL_TABLET | Freq: Two times a day (BID) | ORAL | Status: DC

## 2012-10-22 MED ORDER — CYCLOBENZAPRINE HCL 10 MG PO TABS: 10.0000 mg | ORAL_TABLET | Freq: Two times a day (BID) | ORAL | Status: DC | PRN

## 2012-10-22 MED ORDER — OXYCODONE-ACETAMINOPHEN 5-325 MG PO TABS: 1.0000 | ORAL_TABLET | ORAL | Status: DC | PRN

## 2012-10-22 NOTE — Progress Notes (Signed)
I have reviewed this note and agree with all findings. Kati Arish Redner, PT, DPT Pager: [phone]   

## 2012-10-22 NOTE — Discharge Summary (Signed)
. Physician Discharge Summary  Eugene Jackson AVW:[id number] DOB: [birth year] DOA: 10/10/2012  PCP: Nonnie Done., MD  Admit date: 10/10/2012 Discharge date: 10/22/2012  Recommendations for Outpatient Follow-up:  1. Pt will need to follow up with PCP in 2-3 weeks post discharge 2. Please obtain BMP to evaluate electrolytes and kidney function 3. Please also check CBC to evaluate Hg and Hct levels 4. Please note that patient was advised to continue taking Augmentin as recommended by infectious disease specialist, he will need to stay on it for one month with additional refill 5. I have also advised patient to followup with infectious disease specialist as recommended. Patient will be called with the appointment time and date 6. Patient made aware that if he does not hear from infectious disease clinic he needs to call back to ensure that appointment is scheduled for appropriate followup 7. Please note that patient has had pleural effusion drained, 480 cc removed and fluid analysis pending upon discharge. Patient made aware that this needs to be followed up on 8. Please note that during the hospital stay it was noted that platelets steadily increased, we have discussed findings with patient and advised to followup with hematologist and need for referral by PCP  Discharge Diagnoses: Hospital-acquired pneumonia, left pleural effusion, parapneumonic effusion Principal Problem:   HCAP (healthcare-associated pneumonia) Active Problems:   Acute respiratory failure   Alcohol abuse   Hyponatremia   Anemia of chronic disease   Moderate protein-calorie malnutrition   Abnormal liver function tests   Bacteremia   Hemoptysis   Tobacco abuse   Parapneumonic effusion   Severe sepsis  Discharge Condition: Stable  Diet recommendation: Heart healthy diet discussed in details   Brief narrative:  42 yo male smoker presented with dyspnea, fever, chills, nausea and productive cough on  10/10/2012. Noted to have acute hypoxic respiratory failure and hyponatremia from HCAP and ETOH. Developed hemoptysis 10/11 and PCCM consulted.   Principal Problem:  Acute respiratory failure  - secondary to HCAP and pleural effusion  - pt maintaining oxygen saturations at target range  - Chest x-ray 10/17/2012 with moderate to large left pleural effusion which appears complex, questionable parapneumonic effusion  - status post therapeutic thoracentesis 10/17 and doing well, clinically stable, 480 cc of fluid removed  - follow up on fluid analysis and will be to be followed up on - Please note that patient was advised to continue Augmentin for one month with additional refill and followup with infectious disease specialist HCAP (healthcare-associated pneumonia)  - CT 10/11 with worsening multifocal pneumonia.  - pneumococcal antigen +  - ABX changed to IV Ertapenem 10/18 and plan on transitioning to oral upon discharge  - Patient change to Augmentin upon discharge to take for one month with additional refill Active Problems:  GPR bacteremia  - on Meropenem initially. No history of drug resistant organisms, awaiting speciation.  - ABX changed to IV again 10/18 (changed to Ertapenem)  - Upon discharge patient change to Augmentin by mouth, please see above Hyponatremia  - stable overall  Anemia of chronic disease  - alcohol induced bone marrow damage  - Hg/Hct stable and at baseline  Moderate protein-calorie malnutrition  - secondary to acute illness imposed on alcohol abuse and poor oral intake  - encouraged PO intake  Abnormal liver function tests  - secondary to alcohol induced liver damage  - within normal limits 10/18/2012  Tobacco abuse  - cessation discussed in detail  Alcohol abuse  - improved, continue  to keep on CIWA protocol  - cessation discussed in detail  Thrombocytosis  - unclear etiology  - monitor closely in an outpatient setting - hematology consultation may be  necessary if thrombocytosis persists or gets worse  DVT Prophylaxis - SCDs  Consultants:  PCCM  ID Procedures/Studies:  10/11 CT chest >> LLL, lingular, Lt upper lobe consolidation; patchy RUL and RML infiltrate; two lesions RLL mass vs consolidation; small b/l effusions  10/12 Echo >> EF 55%, grade 1 diastolic dysfx  10/15 CXR >> Stable left lung opacity consistent with pneumonia or atelectasis with associated pleural effusion.  10/14 CXR >> Worsening of pneumonia left greater than right with possible cavitation in the region of the lingula and or left lower lobe.  10/16 CT abd >> No abscess, evidence of infection. Extensive LLL and LUL lingula consolidation c/w necrosis, multifocal PNA  10/17 CXR >> Persistent consolidation and effusion on the left. Small area of infiltrate lateral right base. No pneumothorax.  10/17 US Thoracentesis >> Successful ultrasound guided the left thoracentesis yielding 420 mL of pleural fluid.  CULTURES:  Blood 10/08 >> Gram pos rods >> 2/2 (clostridium)  Sputum 10/08 >> oral flora  HIV 10/08 >> Non reactive  Pneumococcal Ag 10/09 >> POSITIVE  Legionella Ag 10/09 >> Negative  Quantiferon gold 10/11 >>  ANTIBIOTICS:  Cefepime 10/08 >> 10/10  Zosyn 10/08 >> 10/08  Vancomycin 10/08 >> 10/10  Meropenem 10/11 >> 10/17  Augmentin 10/17 --> 10/18  Ertapenem 10/18 --> 10/20 Augmentin 10/20 --> continue for next month, additional refill provided by infectious disease specialist  Code Status: Full  Family Communication: Pt at bedside   Discharge Exam: Filed Vitals:   10/22/12 0640  BP: 106/60  Pulse: 86  Temp: 97.9 F (36.6 C)  Resp: 16   Filed Vitals:   10/21/12 0500 10/21/12 1319 10/21/12 2041 10/22/12 0640  BP: 109/67 103/57 111/57 106/60  Pulse: 74 98 87 86  Temp: 98.6 F (37 C) 97.4 F (36.3 C) 98.9 F (37.2 C) 97.9 F (36.6 C)  TempSrc: Oral Oral Oral Oral  Resp: 20 20 20 16   Height:      Weight:      SpO2: 97% 97% 97% 98%     General: Pt is alert, follows commands appropriately, not in acute distress Cardiovascular: Regular rate and rhythm, S1/S2 +, no murmurs, no rubs, no gallops Respiratory: no wheezing, no crackles, no rhonchi Abdominal: Soft, non tender, non distended, bowel sounds +, no guarding Extremities: no edema, no cyanosis, pulses palpable bilaterally DP and PT Neuro: Grossly nonfocal  Discharge Instructions  Discharge Orders   Future Appointments Provider Department Dept Phone   10/29/2012 9:30 AM Julio Sicks, NP [facility] [phone]   Future Orders Complete By Expires   Diet - low sodium heart healthy  As directed    Increase activity slowly  As directed        Medication List         amLODipine 10 MG tablet  Commonly known as:  NORVASC  Take 1 tablet (10 mg total) by mouth daily.     amoxicillin-clavulanate 875-125 MG per tablet  Commonly known as:  AUGMENTIN  Take 1 tablet by mouth 2 (two) times daily.     cyclobenzaprine 10 MG tablet  Commonly known as:  FLEXERIL  Take 1 tablet (10 mg total) by mouth 2 (two) times daily as needed for muscle spasms.     famotidine 20 MG tablet  Commonly known as:  PEPCID  Take 1 tablet (20 mg total) by mouth at bedtime.     multivitamin with minerals Tabs tablet  Take 1 tablet by mouth daily.     oxyCODONE-acetaminophen 5-325 MG per tablet  Commonly known as:  PERCOCET/ROXICET  Take 1-2 tablets by mouth every 3 (three) hours as needed.     thiamine 500 MG tablet  Take 1 tablet (500 mg total) by mouth daily.           Follow-up Information   Follow up with PARRETT,TAMMY, NP On 10/29/2012. (9-30 am)    Specialty:  Nurse Practitioner   Contact information:   [address] [place] [address] [phone]       Follow up with Debbora Presto, MD. (call my cell with any questions [phone])    Specialty:  Internal Medicine   Contact information:   [address] [place]  [address] [phone]        The results of significant diagnostics from this hospitalization (including imaging, microbiology, ancillary and laboratory) are listed below for reference.     Microbiology: Recent Results (from the past 240 hour(s))  CULTURE, BLOOD (ROUTINE X 2)     Status: None   Collection Time    10/18/12  3:55 PM      Result Value Range Status   Specimen Description BLOOD LEFT HAND   Final   Special Requests BOTTLES DRAWN AEROBIC AND ANAEROBIC 10CC EACH   Final   Culture  Setup Time     Final   Value: 10/18/2012 20:36     Performed at [facility]   Culture     Final   Value:        BLOOD CULTURE RECEIVED NO GROWTH TO DATE CULTURE WILL BE HELD FOR 5 DAYS BEFORE ISSUING A FINAL NEGATIVE REPORT     Performed at [facility]   Report Status PENDING   Incomplete  CULTURE, BLOOD (ROUTINE X 2)     Status: None   Collection Time    10/18/12  4:05 PM      Result Value Range Status   Specimen Description BLOOD LEFT ARM   Final   Special Requests BOTTLES DRAWN AEROBIC AND ANAEROBIC 10CC EACH   Final   Culture  Setup Time     Final   Value: 10/18/2012 20:36     Performed at [facility]   Culture     Final   Value:        BLOOD CULTURE RECEIVED NO GROWTH TO DATE CULTURE WILL BE HELD FOR 5 DAYS BEFORE ISSUING A FINAL NEGATIVE REPORT     Performed at [facility]   Report Status PENDING   Incomplete  FUNGUS CULTURE W SMEAR     Status: None   Collection Time    10/19/12  9:23 AM      Result Value Range Status   Specimen Description PLEURAL LEFT   Final   Special Requests NONE   Final   Fungal Smear     Final   Value: NO YEAST OR FUNGAL ELEMENTS SEEN     Performed at [facility]   Culture     Final   Value: CULTURE IN PROGRESS FOR FOUR WEEKS     Performed at [facility]   Report Status PENDING   Incomplete  AFB CULTURE WITH SMEAR     Status: None   Collection Time    10/19/12  9:32 AM  Result Value Range  Status   Specimen Description PLEURAL LEFT   Final   Special Requests NONE   Final   ACID FAST SMEAR     Final   Value: NO ACID FAST BACILLI SEEN     Performed at [facility]   Culture     Final   Value: CULTURE WILL BE EXAMINED FOR 6 WEEKS BEFORE ISSUING A FINAL REPORT     Performed at [facility]   Report Status PENDING   Incomplete  BODY FLUID CULTURE     Status: None   Collection Time    10/19/12  9:32 AM      Result Value Range Status   Specimen Description PLEURAL LEFT   Final   Special Requests NONE   Final   Gram Stain     Final   Value: FEW WBC PRESENT,BOTH PMN AND MONONUCLEAR     NO ORGANISMS SEEN     Performed at [facility]   Culture     Final   Value: NO GROWTH 3 DAYS     Performed at [facility]   Report Status 10/22/2012 FINAL   Final     Labs: Basic Metabolic Panel:  Recent Labs Lab 10/16/12 0510 10/17/12 0545 10/18/12 0440 10/19/12 0510 10/21/12 0410  NA 133* 133* 134* 134* 135  K 3.7 3.8 3.5 3.8 4.3  CL 100 99 100 101 100  CO2 23 23 22 25 24   GLUCOSE 98 101* 76 99 95  BUN 7 7 6 6 7   CREATININE 0.42* 0.42* 0.44* 0.51 0.52  CALCIUM 8.8 8.9 8.5 8.8 9.5  MG 1.9  --   --   --   --   PHOS 3.7  --   --   --   --    Liver Function Tests:  Recent Labs Lab 10/18/12 0440 10/19/12 0510  AST 22  --   ALT 15  --   ALKPHOS 66  --   BILITOT 0.2*  --   PROT 6.0 6.4  ALBUMIN 2.0*  --    CBC:  Recent Labs Lab 10/16/12 0510 10/18/12 0440 10/19/12 0510 10/21/12 0410  WBC 11.9* 11.2* 13.1* 12.5*  HGB 9.9* 10.4* 10.2* 10.4*  HCT 30.0* 31.7* 31.2* 32.0*  MCV 103.1* 102.6* 103.0* 102.6*  PLT 541* 679* 782* 848*   SIGNED: Time coordinating discharge: Over 30 minutes  Debbora Presto, MD  [facility] 10/22/2012, 11:50 AM Pager [phone]  If 7PM-7AM, please contact night-coverage [email or web address] Password [email or web address]

## 2012-10-22 NOTE — Progress Notes (Signed)
Discharge instructions explained using teach back, prescriptions given. Waiting for father to provide transportation. Stable for discharge.

## 2012-10-22 NOTE — Progress Notes (Signed)
Physical Therapy Treatment Patient Details Name: Eugene Jackson MRN: [id number] DOB: 1970/05/18 Today's Date: 10/22/2012 Time: 0902-0927 PT Time Calculation (min): 25 min  PT Assessment / Plan / Recommendation  History of Present Illness Pt is 42 yo male with history of alcohol abuse and seizures, HTN who presents to [facility] ED with main concern of progressively worsening shortness of breath, initially present with exertion and now at rest, associated with subjective fevers, chills, nausea and poor oral intake, productive cough of clear to yellow sputum.   PT Comments   Pt demonstrating progress as he was able to traverse stairs today and perform standing LE strengthening exercises. Pt would continue to benefit from skilled PT in order to improve functional mobility and safety.  Follow Up Recommendations  No PT follow up (pt to continue OPPT)     Does the patient have the potential to tolerate intense rehabilitation     Barriers to Discharge        Equipment Recommendations  None recommended by PT    Recommendations for Other Services    Frequency Min 3X/week   Progress towards PT Goals Progress towards PT goals: Progressing toward goals  Plan Current plan remains appropriate    Precautions / Restrictions Precautions Precautions: Fall Restrictions Weight Bearing Restrictions: No   Pertinent Vitals/Pain Pt reports 2-3/10 back pain at rest with slight increase during ambulation but pt did not wish to take rest break. Pt positioned to comfort at end of session.    Mobility  Bed Mobility Bed Mobility: Supine to Sit;Sitting - Scoot to Edge of Bed Supine to Sit: 6: Modified independent (Device/Increase time) Sitting - Scoot to Edge of Bed: 6: Modified independent (Device/Increase time) Details for Bed Mobility Assistance: MOD I due to increased time secondary to pain. Transfers Transfers: Sit to Stand;Stand to Sit Sit to Stand: 6: Modified independent (Device/Increase time);From  bed;With upper extremity assist Stand to Sit: 6: Modified independent (Device/Increase time);To chair/3-in-1;With upper extremity assist;With armrests Details for Transfer Assistance: MOD I due to increased time. Ambulation/Gait Ambulation/Gait Assistance: 5: Supervision Ambulation Distance (Feet): 200 Feet Assistive device: Rolling walker Ambulation/Gait Assistance Details: Supervision to ensure safety as pt reports SOB and "pain all over" during ambulation but was able to continue talking and did not wish to take rest break. VC's to stay within RW. Gait Pattern: Step-through pattern;Decreased stride length;Trunk flexed Gait velocity: slightly decreased Stairs: Yes Stairs Assistance: 4: Min guard Stairs Assistance Details (indicate cue type and reason): Pt able to ascend/descend 4 steps in step to pattern, with two handrails and min guard to ensure safety. Stair Management Technique: Two rails;Step to pattern Number of Stairs: 4    Exercises General Exercises - Lower Extremity Ankle Circles/Pumps: AROM;Both;20 reps;Seated Short Arc Quad: AROM;20 reps;Supine;Both Long Arc Quad: 20 reps;AROM;Seated;Both Hip ABduction/ADduction: AROM;Supine;Both;20 reps Straight Leg Raises: AROM;20 reps;Supine;Both Hip Flexion/Marching: AROM;20 reps;Seated;Both Heel Raises: AROM;20 reps;Standing;Both Mini-Sqauts: 20 reps;AROM;Both;Standing Other Exercises Other Exercises: Bil hamstring stretches in supine with sheet to assist stretch (2 x 30 sec hold). VC's to improve upright posture.   PT Diagnosis:    PT Problem List:   PT Treatment Interventions:     PT Goals (current goals can now be found in the care plan section)    Visit Information  Last PT Received On: 10/22/12 Assistance Needed: +1 History of Present Illness: Pt is 42 yo male with history of alcohol abuse and seizures, HTN who presents to [facility] ED with main concern of progressively worsening shortness of breath, initially  present with  exertion and now at rest, associated with subjective fevers, chills, nausea and poor oral intake, productive cough of clear to yellow sputum.    Subjective Data      Cognition  Cognition Arousal/Alertness: Awake/alert Behavior During Therapy: WFL for tasks assessed/performed Overall Cognitive Status: Within Functional Limits for tasks assessed    Balance     End of Session PT - End of Session Equipment Utilized During Treatment: Gait belt Activity Tolerance: Patient tolerated treatment well Patient left: in chair;with call bell/phone within reach   GP     Sol Blazing 10/22/2012, 10:10 AM

## 2012-10-24 LAB — CULTURE, BLOOD (ROUTINE X 2)
Culture: NO GROWTH
Culture: NO GROWTH

## 2012-10-29 ENCOUNTER — Other Ambulatory Visit (INDEPENDENT_AMBULATORY_CARE_PROVIDER_SITE_OTHER): Payer: No Typology Code available for payment source

## 2012-10-29 ENCOUNTER — Ambulatory Visit (INDEPENDENT_AMBULATORY_CARE_PROVIDER_SITE_OTHER)
Admission: RE | Admit: 2012-10-29 | Discharge: 2012-10-29 | Disposition: A | Discharge status: Home / Self Care | Payer: No Typology Code available for payment source | Source: Ambulatory Visit | Attending: Adult Health | Admitting: Adult Health

## 2012-10-29 ENCOUNTER — Telehealth: Payer: Self-pay | Admitting: Hematology and Oncology

## 2012-10-29 ENCOUNTER — Ambulatory Visit (INDEPENDENT_AMBULATORY_CARE_PROVIDER_SITE_OTHER): Payer: No Typology Code available for payment source | Admitting: Adult Health

## 2012-10-29 ENCOUNTER — Encounter: Payer: Self-pay | Admitting: Adult Health

## 2012-10-29 VITALS — BP 106/68 | HR 97 | Temp 98.2°F | Ht 69.5 in | Wt 148.6 lb

## 2012-10-29 DIAGNOSIS — E871 Hypo-osmolality and hyponatremia: Secondary | ICD-10-CM

## 2012-10-29 DIAGNOSIS — J90 Pleural effusion, not elsewhere classified: Secondary | ICD-10-CM

## 2012-10-29 DIAGNOSIS — J189 Pneumonia, unspecified organism: Secondary | ICD-10-CM

## 2012-10-29 DIAGNOSIS — R7881 Bacteremia: Secondary | ICD-10-CM

## 2012-10-29 DIAGNOSIS — D638 Anemia in other chronic diseases classified elsewhere: Secondary | ICD-10-CM

## 2012-10-29 DIAGNOSIS — D696 Thrombocytopenia, unspecified: Secondary | ICD-10-CM

## 2012-10-29 DIAGNOSIS — D473 Essential (hemorrhagic) thrombocythemia: Secondary | ICD-10-CM

## 2012-10-29 LAB — CBC WITH DIFFERENTIAL/PLATELET
Basophils Absolute: 0.1 10*3/uL (ref 0.0–0.1)
Basophils Relative: 0.7 % (ref 0.0–3.0)
Eosinophils Absolute: 0.5 10*3/uL (ref 0.0–0.7)
Eosinophils Relative: 3.8 % (ref 0.0–5.0)
Lymphocytes Relative: 19.8 % (ref 12.0–46.0)
MCHC: 33.1 g/dL (ref 30.0–36.0)
MCV: 99.8 fl (ref 78.0–100.0)
Monocytes Absolute: 0.7 10*3/uL (ref 0.1–1.0)
Monocytes Relative: 5.7 % (ref 3.0–12.0)
Neutrophils Relative %: 70 % (ref 43.0–77.0)
Platelets: 1031 10*3/uL — ABNORMAL HIGH (ref 150.0–400.0)
RDW: 19.5 % — ABNORMAL HIGH (ref 11.5–14.6)
WBC: 12 10*3/uL — ABNORMAL HIGH (ref 4.5–10.5)

## 2012-10-29 LAB — BASIC METABOLIC PANEL
BUN: 9 mg/dL (ref 6–23)
Chloride: 103 mEq/L (ref 96–112)
Creatinine, Ser: 0.8 mg/dL (ref 0.4–1.5)
GFR: 119.8 mL/min (ref >60.00)
Potassium: 4.8 mEq/L (ref 3.5–5.1)
Sodium: 139 mEq/L (ref 135–145)

## 2012-10-29 NOTE — Telephone Encounter (Signed)
lvom for pt in re to 10/28 @ 2:45 w/Dr. Dorna Leitz Instructed pt to call back to confirm message was received.

## 2012-10-29 NOTE — Assessment & Plan Note (Signed)
Rising platelet count in alcoholic -refer to hematology

## 2012-10-29 NOTE — Assessment & Plan Note (Signed)
Continue on Augmentin  Set up for ID clinic follow up  Encouraged on med compliance and smoking /etoh cessation

## 2012-10-29 NOTE — Patient Instructions (Signed)
We will make an appointment with Infectious disease clinic as recommended  We are referring you to Hematology for high platelet count.  Continue on Augmentin as directed.  No smoking or drinking alcohol.  follow up Dr. Vassie Loll  In 2 weeks with chest xray  Please contact office for sooner follow up if symptoms do not improve or worsen or seek emergency care

## 2012-10-29 NOTE — Progress Notes (Signed)
Subjective:    Patient ID: Eugene Jackson, male    DOB: 1971-01-01, 42 y.o.   MRN: [id number]  HPI 42 yo smoker with PCCM consult during hospitalization 10/10/12 for HCAP and Left pleural effusion w/ Clostridial Bactermia , and sepsis .   10/29/2012 Post Hospital follow up  Returns for post hospital follow up . Admitted 10/8-10/20 for acute hypoxic respiratory failure from HCAP, sepsis, pleral effsion w/ chronic ETOH abuse   Chest x-ray 10/17/2012 with moderate to large left pleural effusion ? parapneumonic effusion  A thoracentesis  Done on 10/17 w/  480 cc of fluid removed . - CT 10/11 with worsening multifocal pneumonia. Cytology neg for malignant cells.  Pneumococcal antigen was positive  He was found to have Bacteremia -Clostridium + . He was transitioned from IV abx to prolonged course of Augmentin to be guided by ID . Stay was complicated by Anemia of chronic disease , Thrombocytosis with suspected alcohol induced bone marrow damage .   Since discharge he says he is still weak. Tolerating augmentin without n/v/d. No bloody stools.  On average drinks at least a pint of etoh a day. Active smoker.  He says no etoh or smoking since discharge.  But says he plans on restarting as soon as he feels better.  He was encouraged on cessation.   He has not seen ID since discharge and does not have follow up ov planned.   Today labs show worsening thrombocytosis with platelets >1 million.  CXR unchange with persistent LLL aspdz and loculated effusion .   Had recent hospital admit 08/2012 for acute etoh intoxication, septic shock , DIC , pancolitis.    Review of Systems Constitutional:   No  weight loss, night sweats,  Fevers, chills,  +fatigue, or  lassitude.  HEENT:   No headaches,  Difficulty swallowing,  Tooth/dental problems, or  Sore throat,                No sneezing, itching, ear ache, nasal congestion, post nasal drip,   CV:  No chest pain,  Orthopnea, PND, swelling in lower  extremities, anasarca, dizziness, palpitations, syncope.   GI  No heartburn, indigestion, abdominal pain, nausea, vomiting, diarrhea, change in bowel habits, loss of appetite, bloody stools.   Resp:   No chest wall deformity  Skin: no rash or lesions.  GU: no dysuria, change in color of urine, no urgency or frequency.  No flank pain, no hematuria   MS:  No joint pain or swelling.  No decreased range of motion.  No back pain.  Psych:  No change in mood or affect. No depression or anxiety.  No memory loss.         Objective:   Physical Exam GEN: A/Ox3; pleasant , NAD, thin pale male   HEENT:  [place]/AT,  EACs-clear, TMs-wnl, NOSE-clear, THROAT-clear, no lesions, no postnasal drip or exudate noted.   NECK:  Supple w/ fair ROM; no JVD; normal carotid impulses w/o bruits; no thyromegaly or nodules palpated; no lymphadenopathy.  RESP  Left sided basilar crackles no accessory muscle use, no dullness to percussion  CARD:  RRR, no m/r/g  , no peripheral edema, pulses intact, no cyanosis or clubbing.  GI:   Soft & nt; nml bowel sounds; no organomegaly or masses detected.  Musco: Warm bil, no deformities or joint swelling noted.   Neuro: alert, no focal deficits noted.    Skin: Warm, no lesions or rashes  Assessment & Plan:

## 2012-10-29 NOTE — Assessment & Plan Note (Signed)
Left sided PNA w/ parapneumonic loculated effusion S/p thoracentesis on 10/17   Plan  We will make an appointment with Infectious disease clinic as recommended  Continue on Augmentin as directed.  No smoking or drinking alcohol.  follow up Dr. Vassie Loll  In 2 weeks with chest xray  Please contact office for sooner follow up if symptoms do not improve or worsen or seek emergency care

## 2012-10-30 ENCOUNTER — Ambulatory Visit (HOSPITAL_BASED_OUTPATIENT_CLINIC_OR_DEPARTMENT_OTHER): Payer: No Typology Code available for payment source | Admitting: Lab

## 2012-10-30 ENCOUNTER — Encounter: Payer: Self-pay | Admitting: Hematology and Oncology

## 2012-10-30 ENCOUNTER — Ambulatory Visit (HOSPITAL_BASED_OUTPATIENT_CLINIC_OR_DEPARTMENT_OTHER): Payer: No Typology Code available for payment source | Admitting: Hematology and Oncology

## 2012-10-30 ENCOUNTER — Telehealth: Payer: Self-pay | Admitting: *Deleted

## 2012-10-30 ENCOUNTER — Telehealth: Payer: Self-pay | Admitting: Hematology and Oncology

## 2012-10-30 ENCOUNTER — Ambulatory Visit: Payer: No Typology Code available for payment source

## 2012-10-30 VITALS — BP 116/76 | HR 117 | Temp 97.3°F | Resp 20 | Ht 69.5 in | Wt 148.3 lb

## 2012-10-30 DIAGNOSIS — D72829 Elevated white blood cell count, unspecified: Secondary | ICD-10-CM

## 2012-10-30 DIAGNOSIS — J189 Pneumonia, unspecified organism: Secondary | ICD-10-CM

## 2012-10-30 DIAGNOSIS — D473 Essential (hemorrhagic) thrombocythemia: Secondary | ICD-10-CM

## 2012-10-30 DIAGNOSIS — D649 Anemia, unspecified: Secondary | ICD-10-CM

## 2012-10-30 DIAGNOSIS — F1020 Alcohol dependence, uncomplicated: Secondary | ICD-10-CM

## 2012-10-30 LAB — COMPREHENSIVE METABOLIC PANEL (CC13)
ALT: 21 U/L (ref 0–55)
AST: 23 U/L (ref 5–34)
Albumin: 3.2 g/dL — ABNORMAL LOW (ref 3.5–5.0)
Anion Gap: 12 mEq/L — ABNORMAL HIGH (ref 3–11)
CO2: 24 mEq/L (ref 22–29)
Creatinine: 0.8 mg/dL (ref 0.7–1.3)
Potassium: 4.5 mEq/L (ref 3.5–5.1)
Sodium: 139 mEq/L (ref 136–145)
Total Bilirubin: 0.24 mg/dL (ref 0.20–1.20)
Total Protein: 9 g/dL — ABNORMAL HIGH (ref 6.4–8.3)

## 2012-10-30 LAB — CBC WITH DIFFERENTIAL/PLATELET
BASO%: 1.3 % (ref 0.0–2.0)
EOS%: 4.8 % (ref 0.0–7.0)
HCT: 37.9 % — ABNORMAL LOW (ref 38.4–49.9)
LYMPH%: 17.9 % (ref 14.0–49.0)
MCH: 31.7 pg (ref 27.2–33.4)
MCHC: 31.7 g/dL — ABNORMAL LOW (ref 32.0–36.0)
MCV: 99.9 fL — ABNORMAL HIGH (ref 79.3–98.0)
NEUT%: 69.8 % (ref 39.0–75.0)
Platelets: 916 10*3/uL — ABNORMAL HIGH (ref 140–400)
lymph#: 2.6 10*3/uL (ref 0.9–3.3)

## 2012-10-30 LAB — CHCC SMEAR

## 2012-10-30 NOTE — Progress Notes (Signed)
Checked in new pt with no financial concerns. °

## 2012-10-30 NOTE — Telephone Encounter (Signed)
C/D 10/30/12 for appt. 10/30/12

## 2012-10-30 NOTE — Telephone Encounter (Signed)
appts made and printed...td 

## 2012-10-31 ENCOUNTER — Ambulatory Visit (INDEPENDENT_AMBULATORY_CARE_PROVIDER_SITE_OTHER): Payer: No Typology Code available for payment source | Admitting: Internal Medicine

## 2012-10-31 ENCOUNTER — Encounter: Payer: Self-pay | Admitting: Internal Medicine

## 2012-10-31 VITALS — BP 114/79 | HR 98 | Temp 98.2°F | Ht 69.0 in | Wt 146.2 lb

## 2012-10-31 DIAGNOSIS — R7881 Bacteremia: Secondary | ICD-10-CM

## 2012-10-31 DIAGNOSIS — J189 Pneumonia, unspecified organism: Secondary | ICD-10-CM

## 2012-10-31 LAB — IRON AND TIBC CHCC
%SAT: 14 % — ABNORMAL LOW (ref 20–55)
TIBC: 225 ug/dL (ref 202–409)

## 2012-10-31 LAB — SEDIMENTATION RATE: Sed Rate: 131 mm/hr — ABNORMAL HIGH (ref 0–16)

## 2012-10-31 LAB — VITAMIN B12: Vitamin B-12: 606 pg/mL (ref 211–911)

## 2012-10-31 LAB — FOLATE RBC: RBC Folate: 1330 ng/mL (ref >=366)

## 2012-10-31 NOTE — Progress Notes (Signed)
[facility] CONSULT NOTE  Patient Care Team: Nonnie Done, MD as PCP - General (Family Medicine)  CHIEF COMPLAINTS/PURPOSE OF CONSULTATION:  Extreme thrombocytosis  HISTORY OF PRESENTING ILLNESS:  Eugene Jackson 42 y.o. male is here because of abnormal CBC. The patient had recurrent admission to the hospital for the risks reasons. Most recently, he was diagnosed with severe pneumonia requiring intravenous antibiotic therapy. The patient denies history of blood clots. The patient denies any recent signs or symptoms of bleeding such as spontaneous epistaxis, hematuria or hematochezia. He denies any recent fever, chills, night sweats or abnormal weight loss Denies any symptoms of ischemia such as headaches, muscle cramps, chest pain or shortness of breath on exertion. He was recently in the hospital with severe pneumonia. The patient was placed on intravenous antibiotics.  MEDICAL HISTORY:  Past Medical History  Diagnosis Date  . Hypertension   . Broken femur     right  . PONV (postoperative nausea and vomiting)     SURGICAL HISTORY: Past Surgical History  Procedure Laterality Date  . Wrist surgery    . Joint replacement      bilateral hip replacement    SOCIAL HISTORY: History   Social History  . Marital Status: Married    Spouse Name: N/A    Number of Children: N/A  . Years of Education: N/A   Occupational History  . Not on file.   Social History Main Topics  . Smoking status: Current Every Day Smoker -- 0.50 packs/day for 26 years    Types: Cigarettes  . Smokeless tobacco: Never Used  . Alcohol Use: Yes     Comment: 1/5 th vodka every 2 days  . Drug Use: No  . Sexual Activity: Not on file   Other Topics Concern  . Not on file   Social History Narrative  . No narrative on file    FAMILY HISTORY: Family History  Problem Relation Age of Onset  . Cancer Mother 74    breast ca    ALLERGIES:  has No Known Allergies.  MEDICATIONS:   Current Outpatient Prescriptions  Medication Sig Dispense Refill  . amoxicillin-clavulanate (AUGMENTIN) 875-125 MG per tablet Take 1 tablet by mouth 2 (two) times daily.  60 tablet  1  . cyclobenzaprine (FLEXERIL) 10 MG tablet Take 1 tablet (10 mg total) by mouth 2 (two) times daily as needed for muscle spasms.  30 tablet  0  . famotidine (PEPCID) 20 MG tablet Take 1 tablet (20 mg total) by mouth at bedtime.  30 tablet  0  . Multiple Vitamin (MULTIVITAMIN WITH MINERALS) TABS tablet Take 1 tablet by mouth daily.  30 tablet  3  . oxyCODONE-acetaminophen (PERCOCET/ROXICET) 5-325 MG per tablet Take 1-2 tablets by mouth every 3 (three) hours as needed.  30 tablet  0  . thiamine 500 MG tablet Take 1 tablet (500 mg total) by mouth daily.  30 tablet  3  . amLODipine (NORVASC) 10 MG tablet Take 1 tablet (10 mg total) by mouth daily.  30 tablet  3   No current facility-administered medications for this visit.    REVIEW OF SYSTEMS:   Constitutional: Denies fevers, chills or abnormal night sweats Eyes: Denies blurriness of vision, double vision or watery eyes Ears, nose, mouth, throat, and face: Denies mucositis or sore throat Respiratory: Denies cough, dyspnea or wheezes Cardiovascular: Denies palpitation, chest discomfort or lower extremity swelling Gastrointestinal:  Denies nausea, heartburn or change in bowel habits Skin: Denies abnormal  skin rashes Lymphatics: Denies new lymphadenopathy or easy bruising Neurological:Denies numbness, tingling or new weaknesses. He have chronic muscular weakness from musculoskeletal pain Behavioral/Psych: Mood is stable, no new changes  All other systems were reviewed with the patient and are negative.  PHYSICAL EXAMINATION: ECOG PERFORMANCE STATUS: 1 - Symptomatic but completely ambulatory  Filed Vitals:   10/30/12 1459  BP: 116/76  Pulse: 117  Temp: 97.3 F (36.3 C)  Resp: 20   Filed Weights   10/30/12 1459  Weight: 148 lb 4.8 oz (67.268 kg)     GENERAL:alert, no distress and comfortable. Patient looks thin but not cachectic SKIN: skin color, texture, turgor are normal, no rashes or significant lesions EYES: normal, conjunctiva are pink and non-injected, sclera clear OROPHARYNX:no exudate, no erythema and lips, buccal mucosa, and tongue normal  NECK: supple, thyroid normal size, non-tender, without nodularity LYMPH:  no palpable lymphadenopathy in the cervical, axillary or inguinal LUNGS: clear to auscultation and percussion with normal breathing effort HEART: regular rate & rhythm and no murmurs and no lower extremity edema ABDOMEN:abdomen soft, non-tender and normal bowel sounds no palpable splenomegaly Musculoskeletal:no cyanosis of digits and no clubbing  PSYCH: alert & oriented x 3 with fluent speech NEURO: no focal motor/sensory deficits  LABORATORY DATA:  I have reviewed the data as listed   RADIOGRAPHIC STUDIES: I have personally reviewed the radiological images as listed and agreed with the findings in the report. Ct Chest Wo Contrast  10/13/2012   CLINICAL DATA:  Patient having more hemoptysis. Patient on antibiotics for pneumonia.  EXAM: CT CHEST WITHOUT CONTRAST  TECHNIQUE: Multidetector CT imaging of the chest was performed following the standard protocol without IV contrast.  COMPARISON:  Chest radiograph, 10/10/2012  FINDINGS: There is extensive consolidation throughout much of the left lower lobe, the lingula of the left upper lobe and in the patchy distribution throughout the right upper lobe and right middle lobe with smaller areas in the right lower lobe. Two mass like focal opacities are noted laterally in the right lower lobe, largest measuring 2.6 cm in greatest transverse dimension. These are likely focal areas of consolidation. There are small bilateral pleural effusions. Pleural fluid tracks along the obliques fissure on the left. There is some left upper lobe dependent atelectasis adjacent to the pleural  fluid.  There is mediastinal adenopathy. Reference measurements were made of several. There is a prevascular lymph node measuring 10 mm in short axis, a right peritracheal lymph node measuring 11 mm in short axis and a right subcarinal lymph node measuring 8 mm in short axis. These are all likely reactive.  The heart is normal in size. There are moderate coronary artery calcifications. No neck base or axillary adenopathy is seen.  Limited evaluation of the upper abdomen shows evidence of fatty infiltration of the liver but is otherwise unremarkable.  There are degenerative changes of the visualized spine. No osteoblastic or osteolytic lesions.  IMPRESSION: 1. Extensive pneumonia, seen throughout both lungs but most evident in the left lower lobe and in the lingula of the left upper lobe, but also significant in the right upper lobe. There are associated pleural effusions, larger on the left, where it mostly accumulates in the mid to upper hemi thorax and along the left superior oblique fissure. 2. Reactive adenopathy. 3. Multifocal pneumonia appears worsened when compared to the prior chest radiograph.   Electronically Signed   By: Amie Portland M.D.   On: 10/13/2012 12:00     Ct Abdomen Pelvis W  Contrast  10/18/2012   CLINICAL DATA:  Clostridium bacteremia. Evaluate for abscess.  EXAM: CT ABDOMEN AND PELVIS WITH CONTRAST  TECHNIQUE: Multidetector CT imaging of the abdomen and pelvis was performed using the standard protocol following bolus administration of intravenous contrast.  CONTRAST:  OMNIPAQUE IOHEXOL 300 MG/ML  SOLN  COMPARISON:  08/30/2012  FINDINGS: There is dense consolidation, both ground-glass and more dense airspace opacity, in the left lower lobe with patchy areas of additional consolidation in the left upper lobe lingula and small areas of ground-glass and focal opacity in the right middle lobe and right lower lobe. Small left a minimal right pleural effusions are noted. This is  consistent with multifocal pneumonia.  Normal liver, spleen, gallbladder and pancreas. No bile duct dilation. There are no adrenal masses.  Normal kidneys, ureters and bladder.  Atherosclerotic plaque is noted along the abdominal aorta and iliac arteries. No pathologically enlarged lymph nodes are seen. There are no abnormal fluid collections.  Mild increased stool is noted throughout the colon. No bowel wall thickening or inflammatory changes are seen. The small bowel is unremarkable. A normal appendix is visualized.  Bilateral hip prostheses are well-seated and aligned. There are degenerative changes noted of the visualized spine. No osteoblastic or osteolytic lesions.  IMPRESSION: 1. No abscess or evidence of infection below the diaphragm. 2. Extensive left lower lobe and left upper lobe lingula consolidation. Not mentioned above, there is an area of cystic change within the lingula consistent with necrosis. Milder areas of airspace opacity are noted in the right middle and lower lobes. Small left a minimal right pleural effusions. This is presumed to be multifocal pneumonia. 3. Mild increased stool noted throughout the colon. No bowel inflammatory changes. A normal appendix is visualized. 4. Well-positioned bilateral hip prostheses. Degenerative changes are noted of the visualized spine and there is atherosclerosis along the abdominal aorta and iliac arteries.   Electronically Signed   By: Amie Portland M.D.   On: 10/18/2012 21:38   US Thoracentesis Asp Pleural Space W/img Guide  10/19/2012   CLINICAL DATA:  Pneumonia, left parapneumonic effusion. Request diagnostic thoracentesis  EXAM: ULTRASOUND GUIDED left THORACENTESIS  COMPARISON:  None  FINDINGS: A total of approximately 420 mL of hazy amber colored fluid was removed. A fluid sample wassent for laboratory analysis.  IMPRESSION: Successful ultrasound guided the left thoracentesis yielding 420 mL of pleural fluid.  Read by: Brayton El PA-C  PROCEDURE:  An ultrasound guided thoracentesis was thoroughly discussed with the patient and questions answered. The benefits, risks, alternatives and complications were also discussed. The patient understands and wishes to proceed with the procedure. Written consent was obtained.  Ultrasound was performed to localize and mark an adequate pocket of fluid in the left chest. The area was then prepped and draped in the normal sterile fashion. 1% Lidocaine was used for local anesthesia. Under ultrasound guidance a 19 gauge Yueh catheter was introduced. Thoracentesis was performed. The catheter was removed and a dressing applied.  Complications:  None immediate   Electronically Signed   By: Richarda Overlie M.D.   On: 10/19/2012 10:23    ASSESSMENT:  Severe thrombocytosis  PLAN:  #1 severe thrombocytosis Certainly this could be reactive in nature. His baseline platelet count on October 9 was at 292 but has gotten progressively worse. I will order an additional workup to rule out myeloproliferative disorder. The patient is at risk of getting clotting and bleeding. I would not recommend aspirin for now. I will see him  back next week for further discussion and review of test results. I do suspect the patient had malignancy. The patient may need bone marrow aspirate and biopsy but would prefer to do blood work for now and see him next week for further discussion. #2 leukocytosis On the day of admission his have elevated white count which remain persistently high despite antibiotics. Certainly myeloproliferative disorder can cause elevated white count. I recommend he continue on antibiotics for now #3 macrocytic anemia It could be due to recent alcoholism. This is likely anemia of chronic disease. The patient denies recent history of bleeding such as epistaxis, hematuria or hematochezia. He is asymptomatic from the anemia. We will observe for now.  He does not require transfusion now. I would order additional workup for the  anemia. #4 alcoholism I recommend the patient to quit drinking. The patient does not seem interested #5 recent pneumonia The patient is improving. His most recent CT scan is improved.  Orders Placed This Encounter  Procedures  . Comprehensive metabolic panel    Standing Status: Future     Number of Occurrences: 1     Standing Expiration Date: 10/30/2013  . CBC with Differential    Standing Status: Future     Number of Occurrences: 1     Standing Expiration Date: 07/22/2013  . BCR-ABL    FISH for BCR/ABL fusion    Standing Status: Future     Number of Occurrences: 1     Standing Expiration Date: 10/30/2013  . JAK2 genotypr    Standing Status: Future     Number of Occurrences: 1     Standing Expiration Date: 10/30/2013  . Lactate dehydrogenase    Standing Status: Future     Number of Occurrences: 1     Standing Expiration Date: 10/30/2013  . Uric Acid    Standing Status: Future     Number of Occurrences: 1     Standing Expiration Date: 10/30/2013  . MPL515 mutation    Standing Status: Future     Number of Occurrences: 1     Standing Expiration Date: 10/30/2013  . Ferritin    Standing Status: Future     Number of Occurrences: 1     Standing Expiration Date: 10/30/2013  . Iron and TIBC    Standing Status: Future     Number of Occurrences: 1     Standing Expiration Date: 10/30/2013  . Sedimentation rate    Standing Status: Future     Number of Occurrences: 1     Standing Expiration Date: 10/30/2013  . Vitamin B12    Standing Status: Future     Number of Occurrences: 1     Standing Expiration Date: 10/30/2013  . Smear    Standing Status: Future     Number of Occurrences: 1     Standing Expiration Date: 10/30/2013  . Folate RBC    Standing Status: Future     Number of Occurrences: 1     Standing Expiration Date: 10/30/2013    All questions were answered. The patient knows to call the clinic with any problems, questions or concerns.    Keylan Costabile,  MD 10/31/2012 7:49 AM

## 2012-10-31 NOTE — Progress Notes (Signed)
Patient ID: Eugene Jackson, male   DOB: 1970-11-16, 42 y.o.   MRN: [id number]         [facility]  Patient Active Problem List   Diagnosis Date Noted  . Thrombocytosis 10/29/2012  . Severe sepsis 10/19/2012  . Parapneumonic effusion 10/18/2012  . Hemoptysis 10/15/2012  . Tobacco abuse 10/15/2012  . Bacteremia 10/12/2012  . Acute respiratory failure 10/10/2012  . HCAP (healthcare-associated pneumonia) 10/10/2012  . Acute alcohol intoxication 09/05/2012  . Anemia of chronic disease 09/05/2012  . Neutropenia 09/05/2012  . Abnormal liver function tests 09/05/2012  . Moderate protein-calorie malnutrition 09/05/2012  . Seizures 09/05/2012  . Hypomagnesemia 09/05/2012  . Hypophosphatemia 09/05/2012  . Thrombocytopenia 08/31/2012  . DIC (disseminated intravascular coagulation) 08/31/2012  . Alcohol abuse 08/30/2012  . Hyponatremia 08/30/2012  . Hypokalemia 08/30/2012  . Elevated lipase 08/30/2012  . Pancytopenia 08/30/2012  . Encephalopathy acute 08/30/2012    Patient's Medications  New Prescriptions   No medications on file  Previous Medications   AMLODIPINE (NORVASC) 10 MG TABLET    Take 1 tablet (10 mg total) by mouth daily.   AMOXICILLIN-CLAVULANATE (AUGMENTIN) 875-125 MG PER TABLET    Take 1 tablet by mouth 2 (two) times daily.   CYCLOBENZAPRINE (FLEXERIL) 10 MG TABLET    Take 1 tablet (10 mg total) by mouth 2 (two) times daily as needed for muscle spasms.   FAMOTIDINE (PEPCID) 20 MG TABLET    Take 1 tablet (20 mg total) by mouth at bedtime.   MULTIPLE VITAMIN (MULTIVITAMIN WITH MINERALS) TABS TABLET    Take 1 tablet by mouth daily.   OXYCODONE-ACETAMINOPHEN (PERCOCET/ROXICET) 5-325 MG PER TABLET    Take 1-2 tablets by mouth every 3 (three) hours as needed.   THIAMINE 500 MG TABLET    Take 1 tablet (500 mg total) by mouth daily.  Modified Medications   No medications on file  Discontinued Medications   No medications on file     Subjective: Mr. Eugene Jackson is in for his hospital visit. His sister is with him today. He was recently hospitalized with severe pneumonia and a left-sided pleural effusion complicated by clostridium bacteremia. He was seen on my partner Dr. Algis Liming. He was hospitalized from October 8-20. He was discharged on oral Augmentin. He is now completed 21 days of total antibiotic therapy. Fortunately he has has not had any alcohol to drink since discharge and has only been putting on one cigarette daily. However, he does not have recurrent plan to quit smoking cigarettes completely. He is being evaluated at the cancer center for thrombocytosis. He is feeling much better with decreased cough, sputum production and chest pain. He is not aware of any recent fevers. Review of Systems: Pertinent items are noted in HPI.  Past Medical History  Diagnosis Date  . Hypertension   . Broken femur     right  . PONV (postoperative nausea and vomiting)     History  Substance Use Topics  . Smoking status: Current Every Day Smoker -- 0.50 packs/day for 26 years    Types: Cigarettes  . Smokeless tobacco: Never Used     Comment: not really inhaling right now  . Alcohol Use: Yes     Comment: 1/5 th vodka every 2 days    Family History  Problem Relation Age of Onset  . Cancer Mother 75    breast ca    No Known Allergies  Objective: Temp: 98.2 F (36.8 C) (10/29 1643) Temp src:  Oral (10/29 1643) BP: 114/79 mmHg (10/29 1643) Pulse Rate: 98 (10/29 1643)  General: He is in good spirits Skin: No rash Lungs: Decreased breath sounds at left base with some crackles Cor: Regular S1 and S2 with no murmurs Abdomen: Soft and nontender  Lab Results  Component Value Date   WBC 14.3* 10/30/2012   HGB 12.0* 10/30/2012   HCT 37.9* 10/30/2012   MCV 99.9* 10/30/2012   PLT 916* 10/30/2012   BMET    Component Value Date/Time   NA 139 10/30/2012 1602   NA 139 10/29/2012 1002   K 4.5 10/30/2012 1602   K 4.8  10/29/2012 1002   CL 103 10/29/2012 1002   CO2 24 10/30/2012 1602   CO2 28 10/29/2012 1002   GLUCOSE 88 10/30/2012 1602   GLUCOSE 107* 10/29/2012 1002   BUN 7.0 10/30/2012 1602   BUN 9 10/29/2012 1002   CREATININE 0.8 10/30/2012 1602   CREATININE 0.8 10/29/2012 1002   CALCIUM 10.7* 10/30/2012 1602   CALCIUM 10.1 10/29/2012 1002   GFRNONAA >90 10/21/2012 0410   GFRAA >90 10/21/2012 0410   HIV Ab NON REACTIVE (10/08 2157)  CHEST 2 VIEW 10/29/2012 COMPARISON: 10/19/2012   FINDINGS:  The cardiac shadow is stable. The right lung remains clear with  minimal scarring laterally. There is persistent infiltrate and  effusion in the left base stable from the prior exam. No new focal  abnormality is seen.   IMPRESSION:  Persistent infiltrate and apparent loculated effusion on the left.    By: Alcide Clever M.D.  On: 10/29/2012 09:47     Assessment: He is improving slowly on therapy for clostridium bacteremia and pneumonia.  Plan: 1. Continue Augmentin for 3 more weeks 2. Encouraged abstinence from alcohol 3. Cigarette cessation counseling provided 4. Followup here in 3 weeks   Cliffton Asters, MD [facility] for Infectious Disease [facility] [phone] pager   [phone] cell 10/31/2012, 5:27 PM

## 2012-11-08 ENCOUNTER — Other Ambulatory Visit: Payer: Self-pay | Admitting: Hematology and Oncology

## 2012-11-08 DIAGNOSIS — D638 Anemia in other chronic diseases classified elsewhere: Secondary | ICD-10-CM

## 2012-11-09 ENCOUNTER — Other Ambulatory Visit (HOSPITAL_BASED_OUTPATIENT_CLINIC_OR_DEPARTMENT_OTHER): Payer: No Typology Code available for payment source | Admitting: Lab

## 2012-11-09 ENCOUNTER — Ambulatory Visit (HOSPITAL_BASED_OUTPATIENT_CLINIC_OR_DEPARTMENT_OTHER): Payer: No Typology Code available for payment source | Admitting: Hematology and Oncology

## 2012-11-09 VITALS — BP 123/77 | HR 85 | Temp 97.9°F | Resp 18 | Ht 69.0 in | Wt 153.3 lb

## 2012-11-09 DIAGNOSIS — D75839 Thrombocytosis, unspecified: Secondary | ICD-10-CM

## 2012-11-09 DIAGNOSIS — D649 Anemia, unspecified: Secondary | ICD-10-CM

## 2012-11-09 DIAGNOSIS — R799 Abnormal finding of blood chemistry, unspecified: Secondary | ICD-10-CM

## 2012-11-09 DIAGNOSIS — D473 Essential (hemorrhagic) thrombocythemia: Secondary | ICD-10-CM

## 2012-11-09 DIAGNOSIS — F17200 Nicotine dependence, unspecified, uncomplicated: Secondary | ICD-10-CM

## 2012-11-09 DIAGNOSIS — D638 Anemia in other chronic diseases classified elsewhere: Secondary | ICD-10-CM

## 2012-11-09 LAB — CBC WITH DIFFERENTIAL/PLATELET
BASO%: 1 % (ref 0.0–2.0)
EOS%: 14.2 % — ABNORMAL HIGH (ref 0.0–7.0)
MCH: 32.5 pg (ref 27.2–33.4)
MCHC: 32.9 g/dL (ref 32.0–36.0)
NEUT#: 5.5 10*3/uL (ref 1.5–6.5)
NEUT%: 54.2 % (ref 39.0–75.0)
RBC: 3.43 10*6/uL — ABNORMAL LOW (ref 4.20–5.82)
RDW: 18.7 % — ABNORMAL HIGH (ref 11.0–14.6)
lymph#: 2.3 10*3/uL (ref 0.9–3.3)

## 2012-11-09 NOTE — Progress Notes (Signed)
[facility] OFFICE PROGRESS NOTE  SLATOSKY,JOHN J., MD DIAGNOSIS:  Elevated platelet count, recent severe pneumonia  SUMMARY OF HEMATOLOGIC HISTORY: This is a 42 year old gentleman with background history of alcoholism was admitted with severe pneumonia and hip fracture. He was found to have a very high platelet count was referred here. INTERVAL HISTORY: VINSON TIETZE 42 y.o. male returns for further followup. He is doing much better. He has completed intravenous antibiotic and recently his placed on a chronic antibiotic regimen. He denies any recent fever, chills, night sweats or abnormal weight loss The patient denies any recent signs or symptoms of bleeding such as spontaneous epistaxis, hematuria or hematochezia.  I have reviewed the past medical history, past surgical history, social history and family history with the patient and they are unchanged from previous note.  ALLERGIES:  has No Known Allergies.  MEDICATIONS:  Current Outpatient Prescriptions  Medication Sig Dispense Refill  . amoxicillin-clavulanate (AUGMENTIN) 875-125 MG per tablet Take 1 tablet by mouth 2 (two) times daily.  60 tablet  1  . cyclobenzaprine (FLEXERIL) 10 MG tablet Take 1 tablet (10 mg total) by mouth 2 (two) times daily as needed for muscle spasms.  30 tablet  0  . famotidine (PEPCID) 20 MG tablet Take 1 tablet (20 mg total) by mouth at bedtime.  30 tablet  0  . Multiple Vitamin (MULTIVITAMIN WITH MINERALS) TABS tablet Take 1 tablet by mouth daily.  30 tablet  3  . oxyCODONE-acetaminophen (PERCOCET/ROXICET) 5-325 MG per tablet Take 1-2 tablets by mouth every 3 (three) hours as needed.  30 tablet  0  . thiamine 500 MG tablet Take 1 tablet (500 mg total) by mouth daily.  30 tablet  3  . amLODipine (NORVASC) 10 MG tablet Take 1 tablet (10 mg total) by mouth daily.  30 tablet  3   No current facility-administered medications for this visit.     REVIEW OF SYSTEMS:   Constitutional:  Denies fevers, chills or night sweats All other systems were reviewed with the patient and are negative.  PHYSICAL EXAMINATION: ECOG PERFORMANCE STATUS: 1 - Symptomatic but completely ambulatory  Filed Vitals:   11/09/12 1226  BP: 123/77  Pulse: 85  Temp: 97.9 F (36.6 C)  Resp: 18   Filed Weights   11/09/12 1226  Weight: 153 lb 4.8 oz (69.536 kg)    GENERAL:alert, no distress and comfortable NEURO: alert & oriented x 3 with fluent speech, no focal motor/sensory deficits  LABORATORY DATA:  I have reviewed the data as listed Results for orders placed in visit on 11/09/12 (from the past 48 hour(s))  CBC WITH DIFFERENTIAL     Status: Abnormal   Collection Time    11/09/12 12:11 PM      Result Value Range   WBC 10.1  4.0 - 10.3 10e3/uL   NEUT# 5.5  1.5 - 6.5 10e3/uL   HGB 11.1 (*) 13.0 - 17.1 g/dL   HCT 16.1 (*) 09.6 - 04.5 %   Platelets 552 (*) 140 - 400 10e3/uL   MCV 98.5 (*) 79.3 - 98.0 fL   MCH 32.5  27.2 - 33.4 pg   MCHC 32.9  32.0 - 36.0 g/dL   RBC 4.09 (*) 8.11 - 9.14 10e6/uL   RDW 18.7 (*) 11.0 - 14.6 %   lymph# 2.3  0.9 - 3.3 10e3/uL   MONO# 0.8  0.1 - 0.9 10e3/uL   Eosinophils Absolute 1.4 (*) 0.0 - 0.5 10e3/uL   Basophils Absolute 0.1  0.0 - 0.1 10e3/uL   NEUT% 54.2  39.0 - 75.0 %   LYMPH% 22.5  14.0 - 49.0 %   MONO% 8.1  0.0 - 14.0 %   EOS% 14.2 (*) 0.0 - 7.0 %   BASO% 1.0  0.0 - 2.0 %     ASSESSMENT:  #1 thrombocytosis, likely reactive in nature #2 anemia  PLAN:  #1 thrombocytosis, likely reactive in nature Additional workup including JAK2 mutation and MPL mutation were negative. His platelet counts improving. I recommend observation only. I suspect this is related to recent severe infection #2 anemia This is likely anemia of chronic disease. The patient denies recent history of bleeding such as epistaxis, hematuria or hematochezia. He is asymptomatic from the anemia. We will observe for now.  He does not require transfusion now.  #3 elevated  protein level Likely secondary to recent severe infection. I will order additional workup in his visit to rule out monoclonal proteinemia #4 severe pneumonia You continue antibiotics as directed #5 elevated ferritin This is likely related to recent infection. We'll recheck his ferritin level next visit #6 history of smoking The patient has not smoked since his severe pneumonia. I congratulated his effort #7 alcoholism The patient is attempting to quit drinking. All questions were answered. The patient knows to call the clinic with any problems, questions or concerns. No barriers to learning was detected.  I spent 15 minutes counseling the patient face to face. The total time spent in the appointment was 20 minutes and more than 50% was on counseling.     Mack Alvidrez, MD 11/09/2012 12:52 PM

## 2012-11-12 ENCOUNTER — Ambulatory Visit
Admission: RE | Admit: 2012-11-12 | Discharge: 2012-11-12 | Disposition: A | Discharge status: Home / Self Care | Payer: No Typology Code available for payment source | Source: Ambulatory Visit | Attending: Adult Health | Admitting: Adult Health

## 2012-11-12 ENCOUNTER — Encounter: Payer: Self-pay | Admitting: Adult Health

## 2012-11-12 ENCOUNTER — Ambulatory Visit (INDEPENDENT_AMBULATORY_CARE_PROVIDER_SITE_OTHER)
Admission: RE | Admit: 2012-11-12 | Discharge: 2012-11-12 | Disposition: A | Discharge status: Home / Self Care | Payer: No Typology Code available for payment source | Source: Ambulatory Visit | Attending: Adult Health | Admitting: Adult Health

## 2012-11-12 ENCOUNTER — Telehealth: Payer: Self-pay | Admitting: Hematology and Oncology

## 2012-11-12 ENCOUNTER — Ambulatory Visit (INDEPENDENT_AMBULATORY_CARE_PROVIDER_SITE_OTHER): Payer: No Typology Code available for payment source | Admitting: Adult Health

## 2012-11-12 VITALS — BP 126/78 | HR 99 | Temp 97.5°F | Ht 69.0 in | Wt 155.2 lb

## 2012-11-12 DIAGNOSIS — J189 Pneumonia, unspecified organism: Secondary | ICD-10-CM

## 2012-11-12 DIAGNOSIS — J90 Pleural effusion, not elsewhere classified: Secondary | ICD-10-CM

## 2012-11-12 NOTE — Telephone Encounter (Signed)
lvm for pt regarding to March 2015 appt...mailed pt appt sched and letter for March

## 2012-11-12 NOTE — Progress Notes (Signed)
Subjective:    Patient ID: Eugene Jackson, male    DOB: 02/10/1970, 42 y.o.   MRN: [id number]  HPI  42 yo smoker with PCCM consult during hospitalization 10/10/12 for HCAP and Left pleural effusion w/ Clostridial Bactermia , and sepsis .   10/29/12 Post Hospital follow up  Returns for post hospital follow up . Admitted 10/8-10/20 for acute hypoxic respiratory failure from HCAP, sepsis, pleral effsion w/ chronic ETOH abuse   Chest x-ray 10/17/2012 with moderate to large left pleural effusion ? parapneumonic effusion  A thoracentesis  Done on 10/17 w/  480 cc of fluid removed . - CT 10/11 with worsening multifocal pneumonia. Cytology neg for malignant cells.  Pneumococcal antigen was positive  He was found to have Bacteremia -Clostridium + . He was transitioned from IV abx to prolonged course of Augmentin to be guided by ID . Stay was complicated by Anemia of chronic disease , Thrombocytosis with suspected alcohol induced bone marrow damage .   Since discharge he says he is still weak. Tolerating augmentin without n/v/d. No bloody stools.  On average drinks at least a pint of etoh a Jackson. Active smoker.  He says no etoh or smoking since discharge.  But says he plans on restarting as soon as he feels better.  He was encouraged on cessation.   He has not seen ID since discharge and does not have follow up ov planned.   Today labs show worsening thrombocytosis with platelets >1 million.  CXR unchange with persistent LLL aspdz and loculated effusion .   Had recent hospital admit 08/2012 for acute etoh intoxication, septic shock , DIC , pancolitis.  >refer to ID clinic and hematology and cont on augmentin   11/12/2012 Follow up HCAP Pt  Returns for a two-week followup for her healthcare associated pneumonia, with suspected necrotizing pneumonia, and a parapneumonic effusion  On the left.  hospitalization was complicated by  Clostridium bacteremia. Patient was treated with an aggressive IV  antibiotics, and discharged on a prolonged course of Augmentin. He's been followed by the ID clinic. He was recently seen and continued on 3 weeks of additional Augmentin with followup in 2 weeks in the ID clinic.  patient also had  Significant thrombocytosis. Patient was seen by hematology  Serial platelets are showing a steady  Decline  Near-normal.   Patient says that he has been doing well. Has returned back to work. He continues on Augmentin. He denies any hemoptysis, orthopnea, PND, or leg swelling.  Chest x-ray today shows  No significant change in left-sided consolidation and effusion.  chest x-ray review showed a normal. Chest x-ray in August of this year.  Review of Systems  Constitutional:   No  weight loss, night sweats,  Fevers, chills,  +fatigue, or  lassitude.  HEENT:   No headaches,  Difficulty swallowing,  Tooth/dental problems, or  Sore throat,                No sneezing, itching, ear ache, nasal congestion, post nasal drip,   CV:  No chest pain,  Orthopnea, PND, swelling in lower extremities, anasarca, dizziness, palpitations, syncope.   GI  No heartburn, indigestion, abdominal pain, nausea, vomiting, diarrhea, change in bowel habits, loss of appetite, bloody stools.   Resp:   No chest wall deformity  Skin: no rash or lesions.  GU: no dysuria, change in color of urine, no urgency or frequency.  No flank pain, no hematuria   MS:  No joint pain  or swelling.  No decreased range of motion.  No back pain.  Psych:  No change in mood or affect. No depression or anxiety.  No memory loss.         Objective:   Physical Exam  GEN: A/Ox3; pleasant , NAD, thin pale male   HEENT:  [place]/AT,  EACs-clear, TMs-wnl, NOSE-clear, THROAT-clear, no lesions, no postnasal drip or exudate noted.   NECK:  Supple w/ fair ROM; no JVD; normal carotid impulses w/o bruits; no thyromegaly or nodules palpated; no lymphadenopathy.  RESP  Left sided basilar crackles no accessory muscle use,  no dullness to percussion  CARD:  RRR, no m/r/g  , no peripheral edema, pulses intact, no cyanosis or clubbing.  GI:   Soft & nt; nml bowel sounds; no organomegaly or masses detected.  Musco: Warm bil, no deformities or joint swelling noted.   Neuro: alert, no focal deficits noted.    Skin: Warm, no lesions or rashes         Assessment & Plan:

## 2012-11-12 NOTE — Assessment & Plan Note (Addendum)
Slow to resolve necrotizing PNA w/ parapneumonic effusion  Will continue on Augmentin as directed by ID clinic  Consider repeat CT chest  In future if not improivng Xray review showed nml chest xray in 08/2012 .   Plan  Continue with follow up appointment with Infectious disease clinic as recommended  Continue on Augmentin as directed.  No smoking or drinking alcohol.  Follow up Dr. Sherene Sires  In 2 weeks with chest xray  Please contact office for sooner follow up if symptoms do not improve or worsen or seek emergency care

## 2012-11-12 NOTE — Patient Instructions (Signed)
Continue with follow up appointment with Infectious disease clinic as recommended  Continue on Augmentin as directed.  No smoking or drinking alcohol.  Follow up Dr. Sherene Sires  In 2 weeks with chest xray  Please contact office for sooner follow up if symptoms do not improve or worsen or seek emergency care

## 2012-11-16 LAB — FUNGUS CULTURE W SMEAR

## 2012-11-21 ENCOUNTER — Ambulatory Visit (INDEPENDENT_AMBULATORY_CARE_PROVIDER_SITE_OTHER): Payer: No Typology Code available for payment source | Admitting: Internal Medicine

## 2012-11-21 ENCOUNTER — Encounter: Payer: Self-pay | Admitting: Internal Medicine

## 2012-11-21 VITALS — BP 146/89 | HR 99 | Temp 98.4°F | Ht 69.0 in | Wt 156.5 lb

## 2012-11-21 DIAGNOSIS — R7881 Bacteremia: Secondary | ICD-10-CM

## 2012-11-21 DIAGNOSIS — J189 Pneumonia, unspecified organism: Secondary | ICD-10-CM

## 2012-11-21 NOTE — Progress Notes (Signed)
Patient ID: Eugene Jackson, male   DOB: 1970-02-11, 42 y.o.   MRN: [id number]         [facility]  Patient Active Problem List   Diagnosis Date Noted  . Severe sepsis 10/19/2012    Priority: High  . Bacteremia 10/12/2012    Priority: High  . HCAP (healthcare-associated pneumonia) 10/10/2012    Priority: High  . Iron overload 11/09/2012  . Thrombocytosis 10/29/2012  . Parapneumonic effusion 10/18/2012  . Hemoptysis 10/15/2012  . Tobacco abuse 10/15/2012  . Acute respiratory failure 10/10/2012  . Acute alcohol intoxication 09/05/2012  . Anemia of chronic disease 09/05/2012  . Abnormal liver function tests 09/05/2012  . Moderate protein-calorie malnutrition 09/05/2012  . Seizures 09/05/2012  . Thrombocytopenia 08/31/2012  . DIC (disseminated intravascular coagulation) 08/31/2012  . Alcohol abuse 08/30/2012  . Hyponatremia 08/30/2012  . Hypokalemia 08/30/2012  . Elevated lipase 08/30/2012  . Pancytopenia 08/30/2012  . Encephalopathy acute 08/30/2012    Patient's Medications  New Prescriptions   No medications on file  Previous Medications   AMLODIPINE (NORVASC) 10 MG TABLET    Take 1 tablet (10 mg total) by mouth daily.   MULTIPLE VITAMIN (MULTIVITAMIN WITH MINERALS) TABS TABLET    Take 1 tablet by mouth daily.  Modified Medications   No medications on file  Discontinued Medications   AMOXICILLIN-CLAVULANATE (AUGMENTIN) 875-125 MG PER TABLET    Take 1 tablet by mouth 2 (two) times daily.   CYCLOBENZAPRINE (FLEXERIL) 10 MG TABLET    Take 1 tablet (10 mg total) by mouth 2 (two) times daily as needed for muscle spasms.   FAMOTIDINE (PEPCID) 20 MG TABLET    Take 1 tablet (20 mg total) by mouth at bedtime.   OXYCODONE-ACETAMINOPHEN (PERCOCET/ROXICET) 5-325 MG PER TABLET    Take 1-2 tablets by mouth every 3 (three) hours as needed.   THIAMINE 500 MG TABLET    Take 1 tablet (500 mg total) by mouth daily.    Subjective: Eugene Jackson is in for his  routine followup visit. He is feeling better and is now back at work as a Writer. He is now completed 42 days of antibiotic therapy including the last 3 weeks on oral amoxicillin clavulanate. He has had no problems tolerating his antibiotic. He is still smoking cigarettes but says he only puffs on them and does not inhale. He has not called the quit line yet and has no current plans to try to quit completely. He does drink some alcohol but says that he has not been drinking to excess. He has no fever, cough or sputum production. His dyspnea on exertion is improving. He received influenza and pneumococcal vaccines while hospitalized.  Review of Systems: Constitutional: negative Eyes: negative Ears, nose, mouth, throat, and face: negative Respiratory: positive for dyspnea on exertion, negative for cough, hemoptysis, pleurisy/chest pain and sputum Cardiovascular: negative Gastrointestinal: negative Genitourinary:negative  Past Medical History  Diagnosis Date  . Hypertension   . Broken femur     right  . PONV (postoperative nausea and vomiting)     History  Substance Use Topics  . Smoking status: Current Some Day Smoker -- 0.10 packs/day for 26 years    Types: Cigarettes  . Smokeless tobacco: Never Used     Comment: not really inhaling right now  . Alcohol Use: Yes     Comment: 1/5 th vodka every 2 days    Family History  Problem Relation Age of Onset  . Cancer Mother  50    breast ca  . Cancer Father     No Known Allergies  Objective: Temp: 98.4 F (36.9 C) (11/19 1338) Temp src: Oral (11/19 1338) BP: 146/89 mmHg (11/19 1338) Pulse Rate: 99 (11/19 1338)  General: He is in good spirits. His weight is up 16 pounds over the past 2 months to 156 Skin: No rash Oral: No oropharyngeal lesions Lungs: Clear except a few faint crackles in the left base posteriorly Cor: Regular S1 and S2 no murmurs Abdomen: Soft and nontender Mood and affect: Normal  Lab Results    Component Value Date   WBC 10.1 11/09/2012   HGB 11.1* 11/09/2012   HCT 33.9* 11/09/2012   MCV 98.5* 11/09/2012   PLT 552* 11/09/2012   BMET    Component Value Date/Time   NA 139 10/30/2012 1602   NA 139 10/29/2012 1002   K 4.5 10/30/2012 1602   K 4.8 10/29/2012 1002   CL 103 10/29/2012 1002   CO2 24 10/30/2012 1602   CO2 28 10/29/2012 1002   GLUCOSE 88 10/30/2012 1602   GLUCOSE 107* 10/29/2012 1002   BUN 7.0 10/30/2012 1602   BUN 9 10/29/2012 1002   CREATININE 0.8 10/30/2012 1602   CREATININE 0.8 10/29/2012 1002   CALCIUM 10.7* 10/30/2012 1602   CALCIUM 10.1 10/29/2012 1002   GFRNONAA >90 10/21/2012 0410   GFRAA >90 10/21/2012 0410   Lab Results  Component Value Date   ALT 21 10/30/2012   AST 23 10/30/2012   ALKPHOS 96 10/30/2012   BILITOT 0.24 10/30/2012    CHEST 2 VIEW 11/12/2012 COMPARISON: Chest x-ray 10/29/2012 .  FINDINGS:   Persistent infiltrate with left-sided pleural effusion noted on the  left. Underlying mass lesion can't be excluded. Right lung is clear.  Mediastinum and hilar structures are normal. Heart size normal.  Degenerative changes both shoulders and lumbar spine thoracic spine.   IMPRESSION:  Persistent unchanged left lower lobe infiltrate and left pleural  effusion. No new finding is identified.   Electronically Signed  By: Maisie Fus Register  On: 11/12/2012 09:34   Assessment: His left lung pneumonia and bacteremia have resolved. However. Amoxicillin clavulanate now and observe off of antibiotics.  I have encouraged him to call the tobacco quit line and establish a plan to quit completely.  I also encouraged him to consider complete abstinence from alcohol.  Plan: 1. Discontinue amoxicillin clavulanate and observe off of antibiotics 2. Cigarette cessation counseling provided given him written information on the [place] tobacco quit line 3. Abstinence from all alcohol encouraged 4. Follow up here as needed   Cliffton Asters,  MD [facility] [phone] pager   [phone] cell 11/21/2012, 1:57 PM

## 2012-11-26 ENCOUNTER — Ambulatory Visit (INDEPENDENT_AMBULATORY_CARE_PROVIDER_SITE_OTHER)
Admission: RE | Admit: 2012-11-26 | Discharge: 2012-11-26 | Disposition: A | Discharge status: Home / Self Care | Payer: No Typology Code available for payment source | Source: Ambulatory Visit | Attending: Internal Medicine | Admitting: Internal Medicine

## 2012-11-26 ENCOUNTER — Ambulatory Visit (INDEPENDENT_AMBULATORY_CARE_PROVIDER_SITE_OTHER): Payer: No Typology Code available for payment source | Admitting: Internal Medicine

## 2012-11-26 ENCOUNTER — Encounter: Payer: Self-pay | Admitting: Internal Medicine

## 2012-11-26 VITALS — BP 140/84 | HR 89 | Temp 98.4°F | Ht 69.0 in | Wt 156.2 lb

## 2012-11-26 DIAGNOSIS — J90 Pleural effusion, not elsewhere classified: Secondary | ICD-10-CM

## 2012-11-26 DIAGNOSIS — F17200 Nicotine dependence, unspecified, uncomplicated: Secondary | ICD-10-CM

## 2012-11-26 DIAGNOSIS — J189 Pneumonia, unspecified organism: Secondary | ICD-10-CM

## 2012-11-26 NOTE — Assessment & Plan Note (Signed)

## 2012-11-26 NOTE — Patient Instructions (Signed)
Please remember to go to the  x-ray department downstairs for your tests - we will call you with the results when they are available.    The key is to stop smoking completely before smoking completely stops you!    Please schedule a follow up office visit in 6 weeks, call sooner if needed with pfts on return  

## 2012-11-26 NOTE — Progress Notes (Signed)
Subjective:    Patient ID: Eugene Jackson, male    DOB: [birth year], 42 y.o.   MRN: [id number]    [place] in [place]  Brief patient profile:  42 yowm smoker/ alcholic admitted with septic picture, obtunded  [facility] August 28 2/14  with CT showing pancolitis and terminial ileitis sp IV zosyn, vanco, then cipro, flagyl,   readmitted 10/10/12 with multifocal PNA with prominent LLL and lingular invovlement and clostridial bacteremia with   ID opinion : process seen on CT in late August may have some relevance to  >>  Either he seeded his blood due to colonic infection at that time or he may have aspiration even that led to his multifocal pna/parapneumonic effusion    History of Present Illness  10/29/12 Post Hospital follow up / NP Returns for post hospital follow up . Admitted 10/8-10/20 for acute hypoxic respiratory failure from HCAP, sepsis, pleural effsion w/ chronic ETOH abuse   Chest x-ray 10/17/2012 with moderate to large left pleural effusion ? parapneumonic effusion  A thoracentesis  Done on 10/17 w/  480 cc of fluid removed . - CT 10/11 with worsening multifocal pneumonia. Cytology neg for malignant cells.  Pneumococcal antigen was positive  He was found to have Bacteremia -Clostridium + . He was transitioned from IV abx to prolonged course of Augmentin to be guided by ID . Stay was complicated by Anemia of chronic disease , Thrombocytosis with suspected alcohol induced bone marrow damage .   Since discharge he says he is still weak. Tolerating augmentin without n/v/d. No bloody stools.  On average drinks at least a pint of etoh a day. Active smoker.  He says no etoh or smoking since discharge.  But says he plans on restarting as soon as he feels better.  He was encouraged on cessation.  >>  labs show worsening thrombocytosis with platelets >1 million.  CXR unchange with persistent LLL aspdz and loculated effusion  rec f/u ID/ hematology     11/12/2012 Follow up HCAP Pt   Returns for a two-week followup for her healthcare associated pneumonia, with suspected necrotizing pneumonia, and a parapneumonic effusion  On the left.  hospitalization was complicated by  Clostridium bacteremia. Patient was treated with an aggressive IV antibiotics, and discharged on a prolonged course of Augmentin. He's been followed by the ID clinic. He was recently seen and continued on 3 weeks of additional Augmentin with followup in 2 weeks in the ID clinic.  patient also had  Significant thrombocytosis. Patient was seen by hematology  Serial platelets are showing a steady  Decline  Near-normal. Patient says   been doing well. Has returned back to work. He continues on Augmentin. He denies any hemoptysis, orthopnea, PND, or leg swelling.  Chest x-ray today shows  No significant change in left-sided consolidation and effusion.  chest x-ray review showed a normal. Chest x-ray in August of this year. Continue with follow up appointment with Infectious disease clinic as recommended  Continue on Augmentin as directed.  No smoking or drinking alcohol.   11/21/12 ID Eugene Jackson:  D/c abx and observe   11/26/2012 f/u ov/[name] still smoking re: pna/ L parapneumonic effusion Chief Complaint  Patient presents with  . Follow-up    Breathing doing well.  Has pain when taking in deep breath on (L) side back lung area   no real change tightness with deep breath L post, activities  not limited by breathing  No obvious day to day or daytime variabilty  or assoc chronic cough or cp or chest tightness, subjective wheeze overt sinus or hb symptoms. No unusual exp hx or h/o childhood pna/ asthma or knowledge of premature birth.  Sleeping ok without nocturnal  or early am exacerbation  of respiratory  c/o's or need for noct saba. Also denies any obvious fluctuation of symptoms with weather or environmental changes or other aggravating or alleviating factors except as outlined above   Current Medications,  Allergies, Complete Past Medical History, Past Surgical History, Family History, and Social History were reviewed in [employer] record.  ROS  The following are not active complaints unless bolded sore throat, dysphagia, dental problems, itching, sneezing,  nasal congestion or excess/ purulent secretions, ear ache,   fever, chills, sweats, unintended wt loss, pleuritic or exertional cp, hemoptysis,  orthopnea pnd or leg swelling, presyncope, palpitations, heartburn, abdominal pain, anorexia, nausea, vomiting, diarrhea  or change in bowel or urinary habits, change in stools or urine, dysuria,hematuria,  rash, arthralgias, visual complaints, headache, numbness weakness or ataxia or problems with walking or coordination,  change in mood/affect or memory.                    Objective:   Physical Exam  GEN: A/Ox3; pleasant , NAD, thin pale male   Wt Readings from Last 3 Encounters:  11/26/12 156 lb 3.2 oz (70.852 kg)  11/21/12 156 lb 8 oz (70.988 kg)  11/12/12 155 lb 3.2 oz (70.398 kg)      HEENT:  [place]/AT,  EACs-clear, TMs-wnl, NOSE-clear, THROAT-clear, no lesions, no postnasal drip or exudate noted.   NECK:  Supple w/ fair ROM; no JVD; normal carotid impulses w/o bruits; no thyromegaly or nodules palpated; no lymphadenopathy.  RESP  Min dullness, decreased bs L base   CARD:  RRR, no m/r/g  , no peripheral edema, pulses intact, no cyanosis or clubbing.  GI:   Soft & nt; nml bowel sounds; no organomegaly or masses detected.  Musco: Warm bil, no deformities or joint swelling noted.   Neuro: alert, no focal deficits noted.    Skin: Warm, no lesions or rashes    CXR  11/26/2012 :  No significant change in residual lingula and left lower lobe  atelectasis/ consolidation and associated small effusion, a  component which may be loculated. My comment: overall much improved aeration L base vs previous available studies      Assessment & Plan:

## 2012-11-26 NOTE — Assessment & Plan Note (Addendum)
Chest x-ray 10/17/2012 with moderate to large left pleural effusion ? parapneumonic effusion  A thoracentesis  Done on 10/17 w/  480 cc of fluid removed . - CT 10/11 with worsening multifocal pneumonia.  Pneumococcal antigen was positive  Cytology neg for malignancy cells.  CXR >loculated Left effusion 10/29/2012 , no change 11/12/2012 > overall serial improvement  Doing fine off abx with minimal residual tightness on L to be expected and can follow this conservatively for now

## 2012-12-02 LAB — AFB CULTURE WITH SMEAR (NOT AT ARMC): Acid Fast Smear: NONE SEEN

## 2013-01-07 ENCOUNTER — Ambulatory Visit: Payer: No Typology Code available for payment source | Admitting: Internal Medicine

## 2013-01-09 ENCOUNTER — Ambulatory Visit: Payer: No Typology Code available for payment source | Admitting: Internal Medicine

## 2013-02-04 ENCOUNTER — Ambulatory Visit: Payer: No Typology Code available for payment source | Admitting: Hematology and Oncology

## 2013-02-04 ENCOUNTER — Other Ambulatory Visit: Payer: No Typology Code available for payment source

## 2014-07-20 IMAGING — CR DG CHEST DECUBITUS BILAT
2 series · 2 of 2 positions shown · non-contrast
Comparison: CHEST x-ray 10/17/2012.

CLINICAL DATA: Left parapneumonic pleural effusion. Weakness.

EXAM:
CHEST - BILATERAL DECUBITUS VIEW

[w chest decub. (1 of 2)]
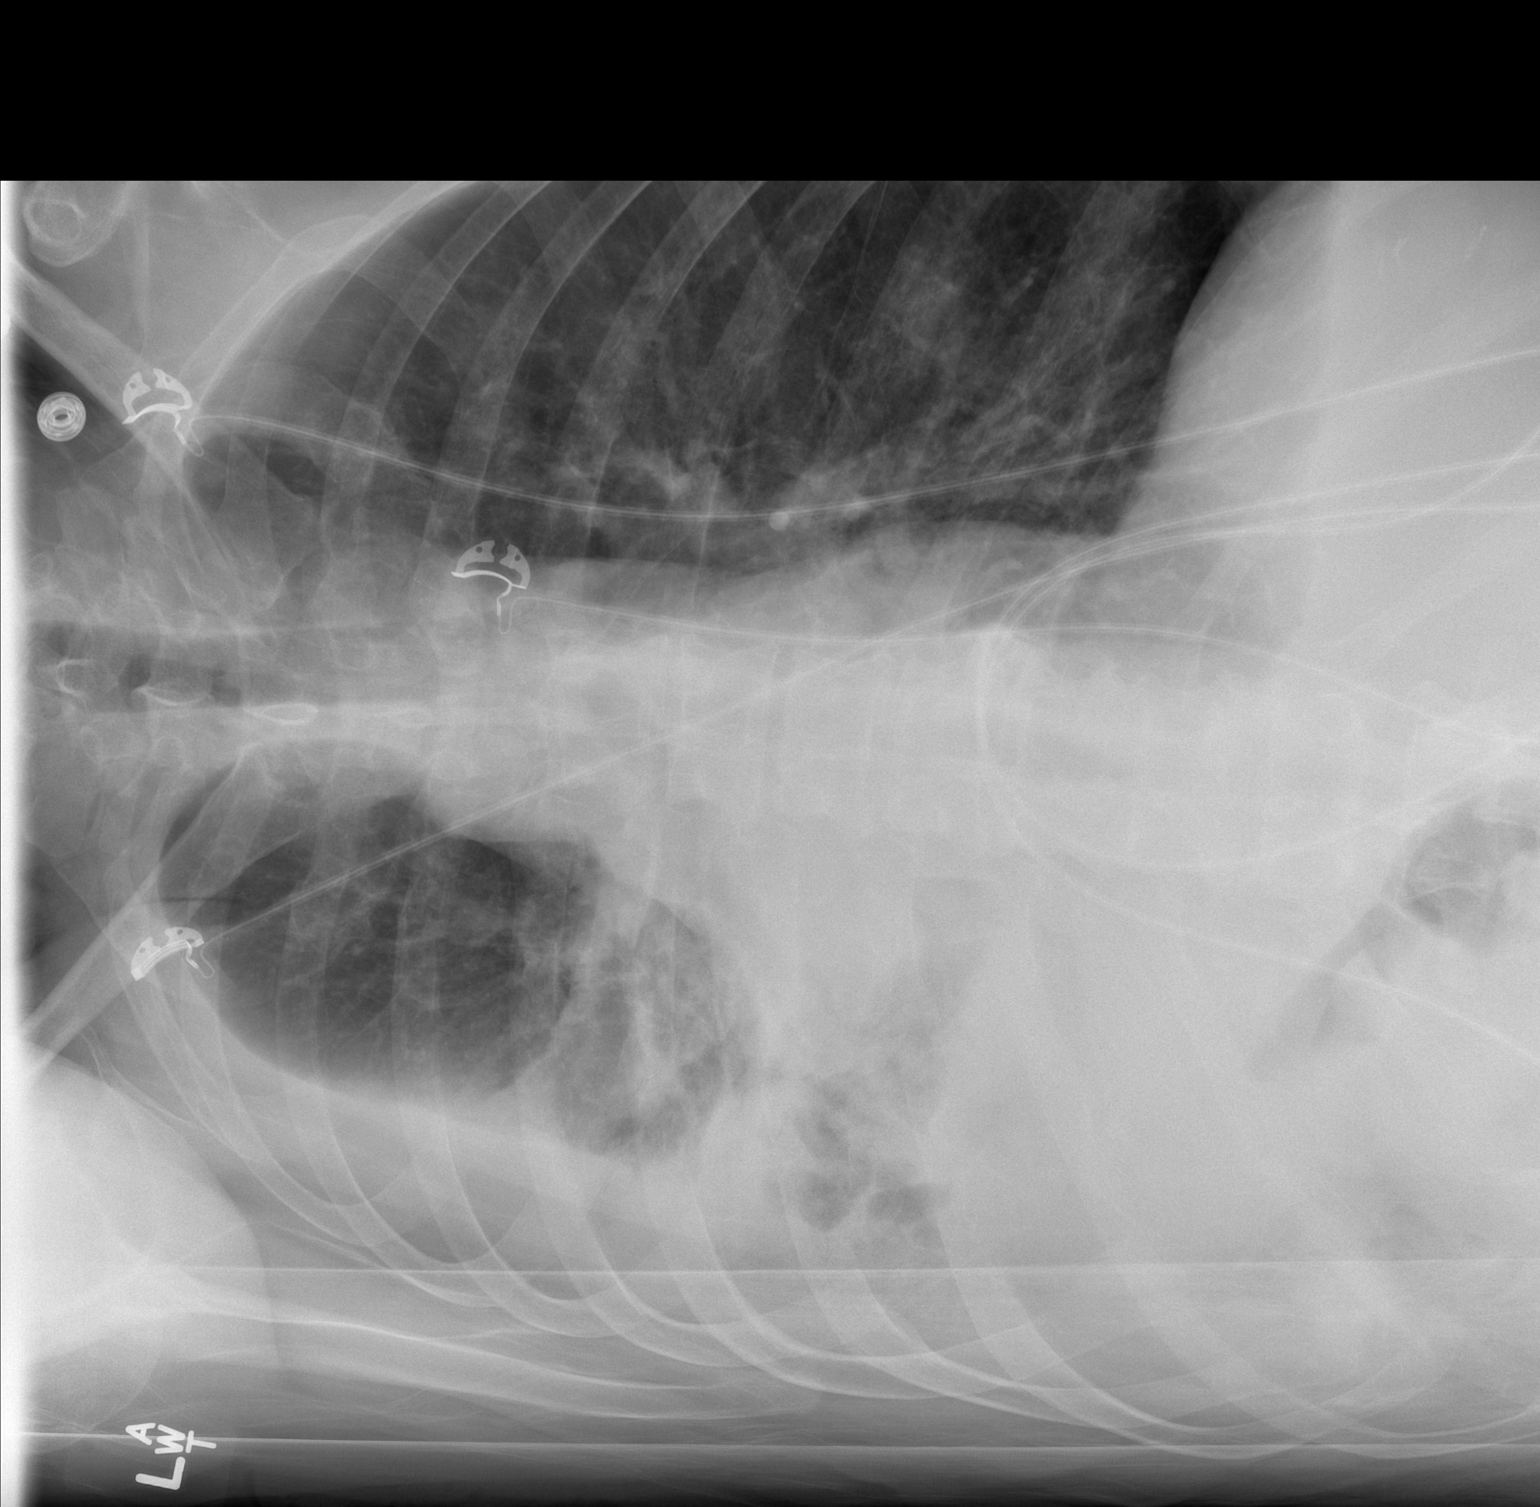

[w chest decub. (2 of 2)]
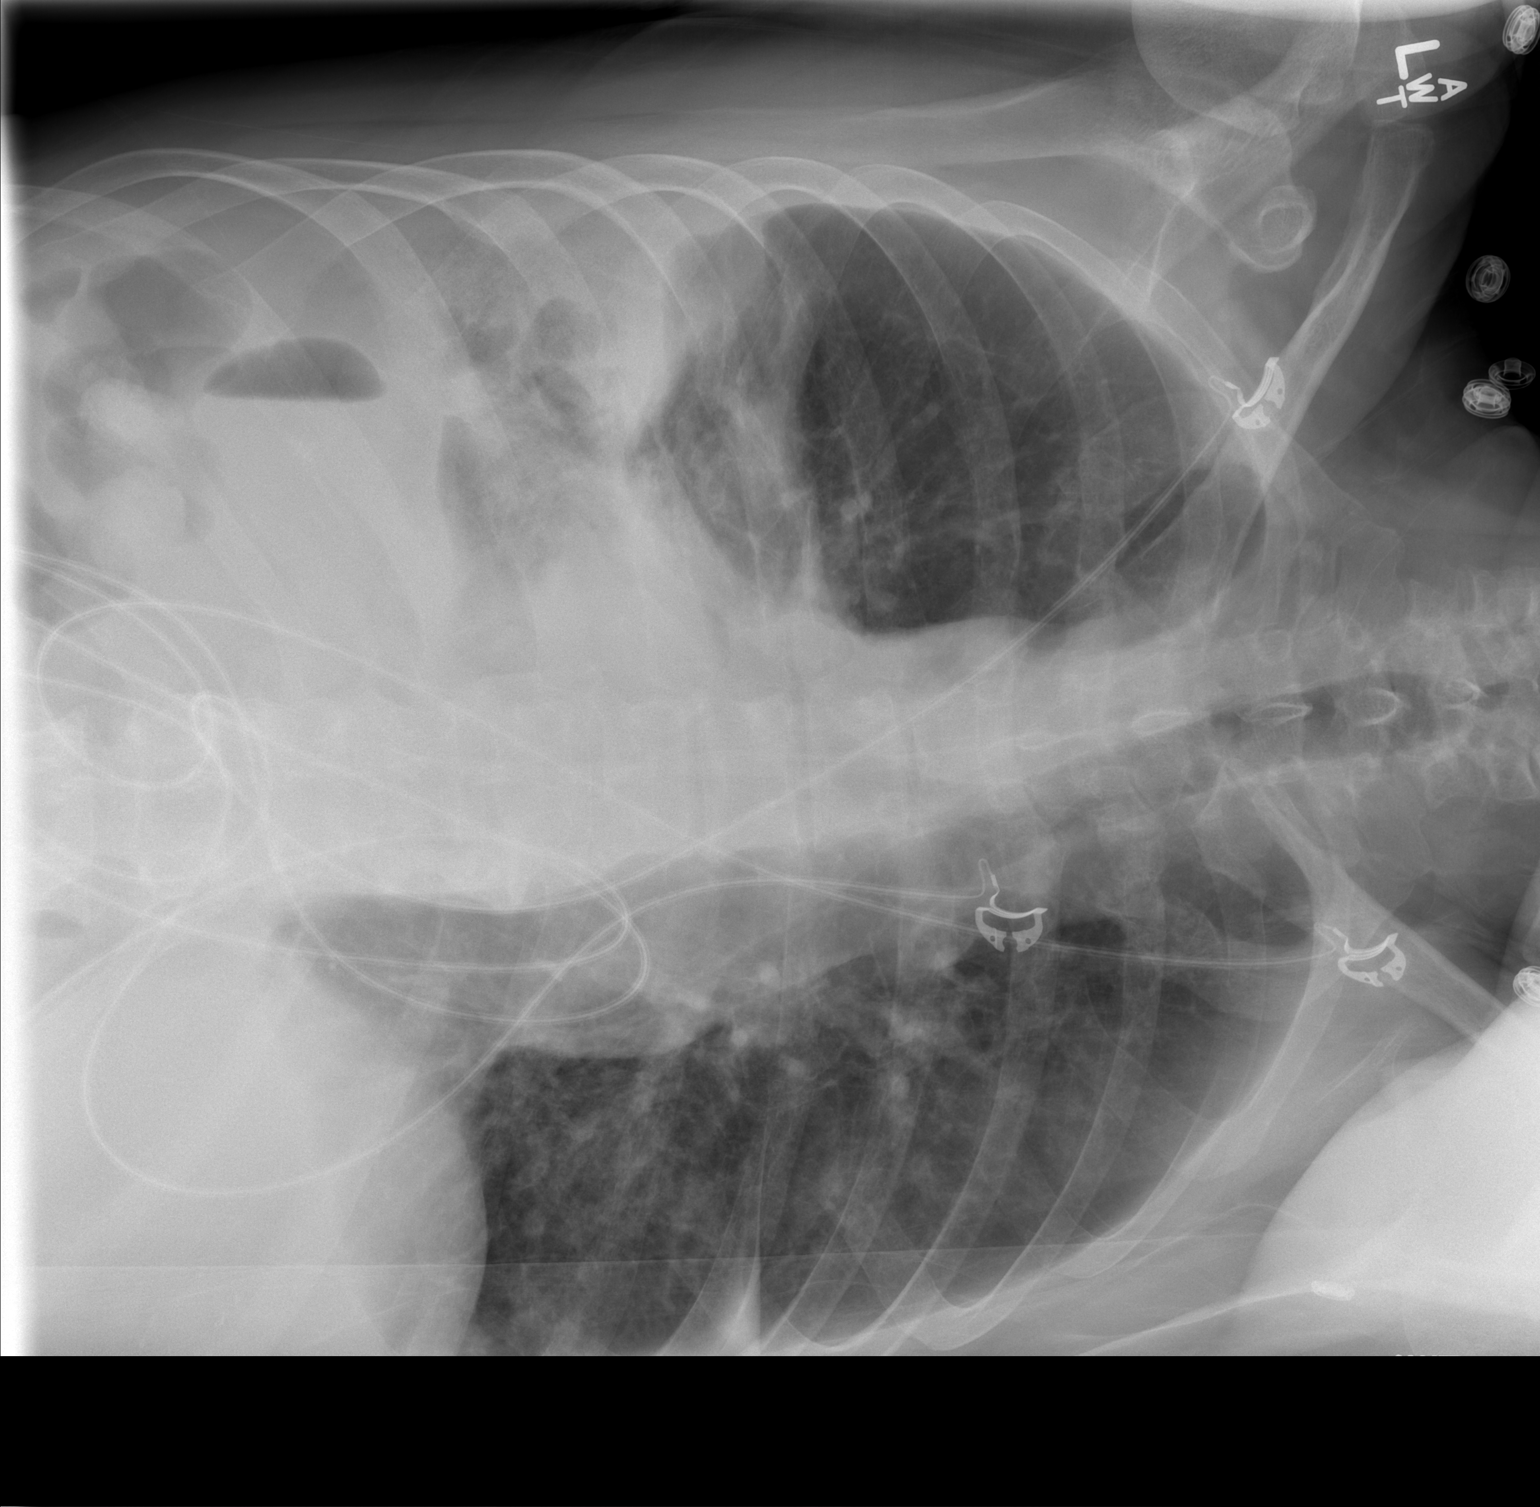

[2 of 2 positions shown; findings below may reference images not displayed]

FINDINGS: Bilateral decubitus views of the chest demonstrate some layering of
the moderate to large left pleural effusion on the left lateral
decubitus view. However, on the right lateral decubitus view, the
effusion fails to layer along the medial aspect of the left
hemithorax, which suggests at least partial loculation of the fluid.
Extensive atelectasis and or consolidation in the base of the left
lung is unchanged.
IMPRESSION: 1. Moderate to large left pleural effusion appears slightly complex.
There is likely a freely flowing component as well as a partially
loculated component to this parapneumonic effusion.

## 2014-07-20 IMAGING — CR DG CHEST 2V
3 series · 3 of 3 positions shown · non-contrast
Comparison: October 16, 2012.

CLINICAL DATA: Cough, pneumonia.

EXAM:
CHEST  2 VIEW

[w chest pa (1 of 2)]
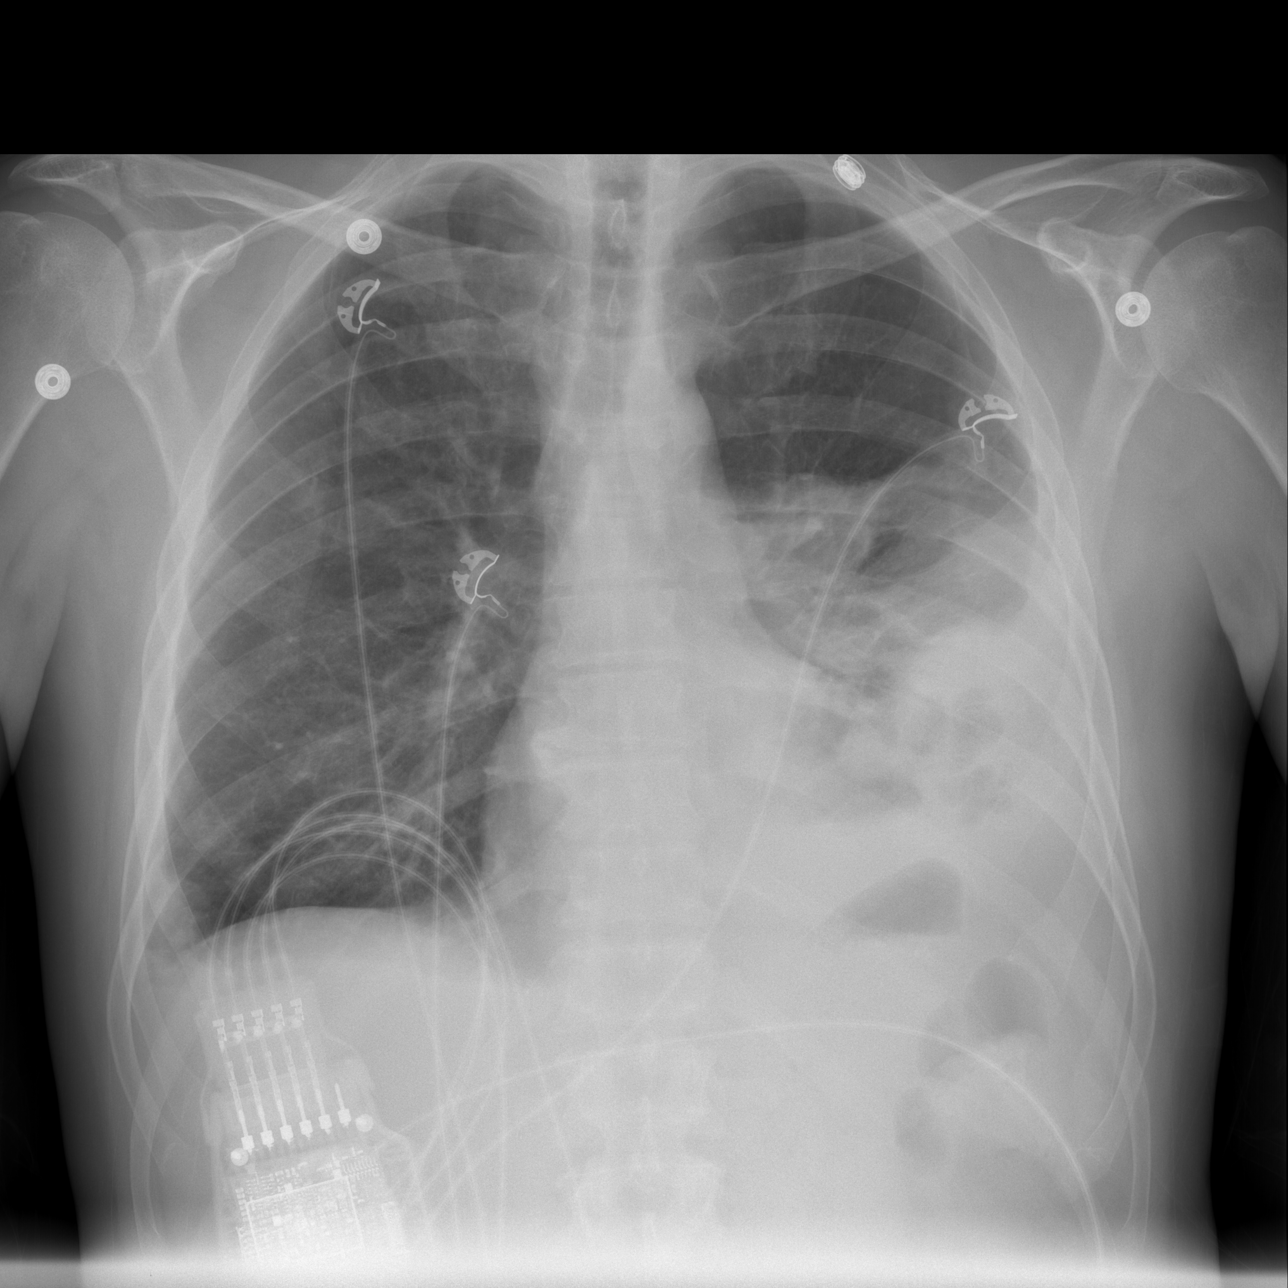

[w chest pa (2 of 2)]
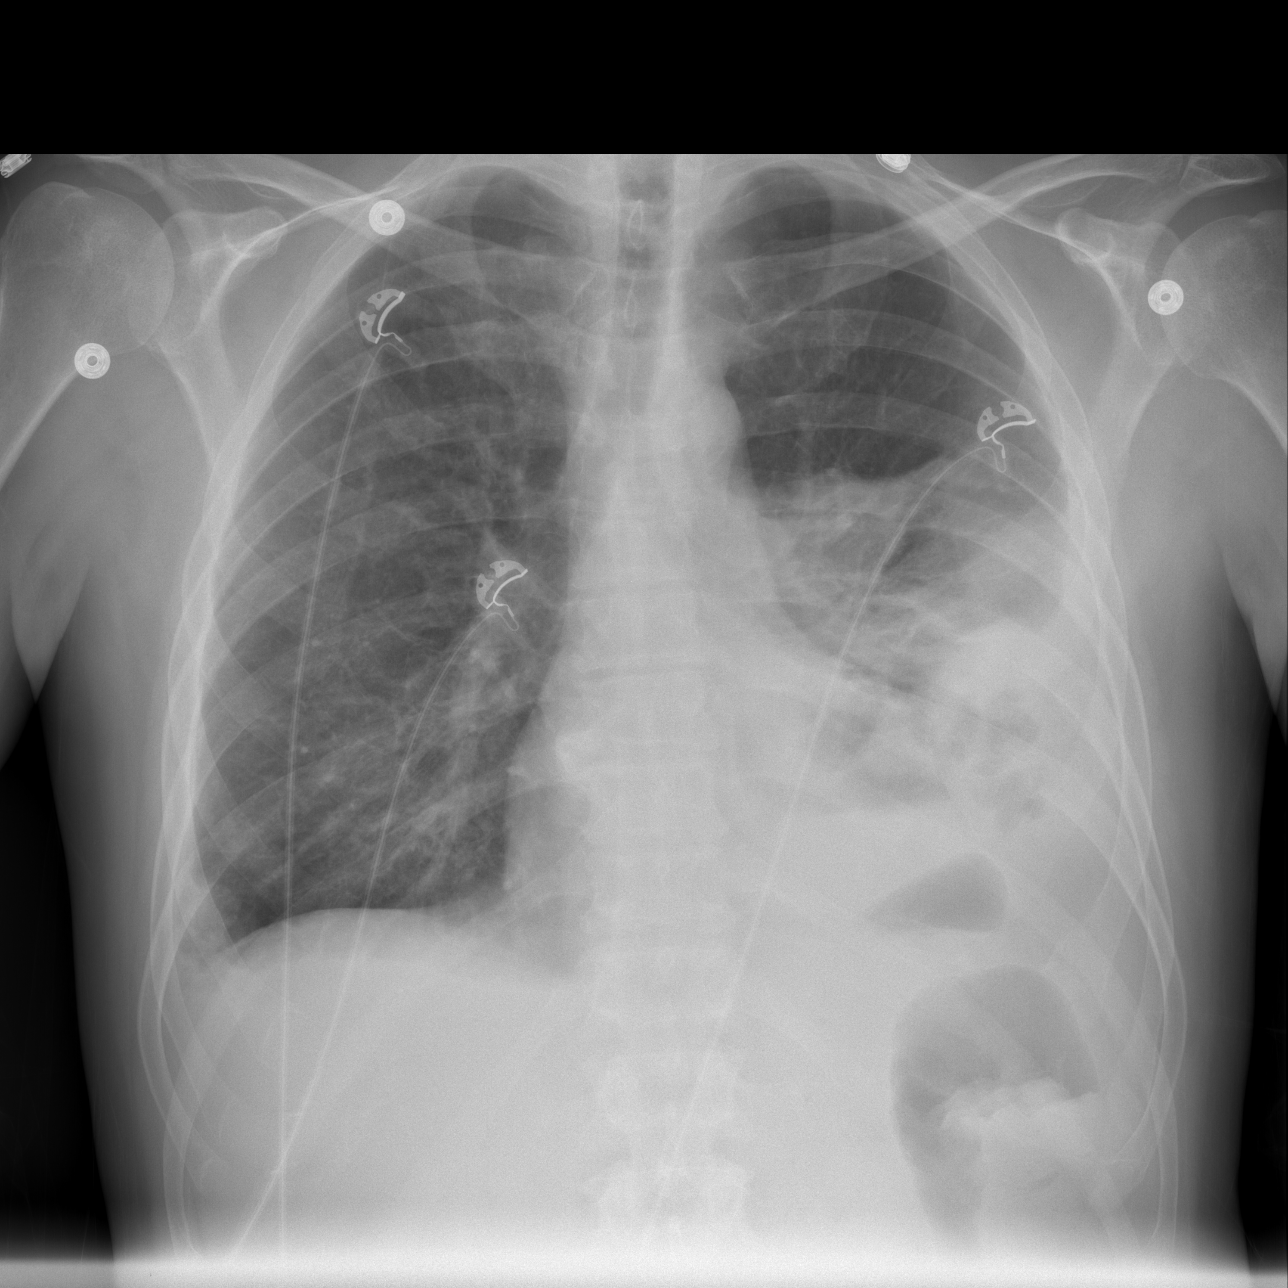

[w chest lat]
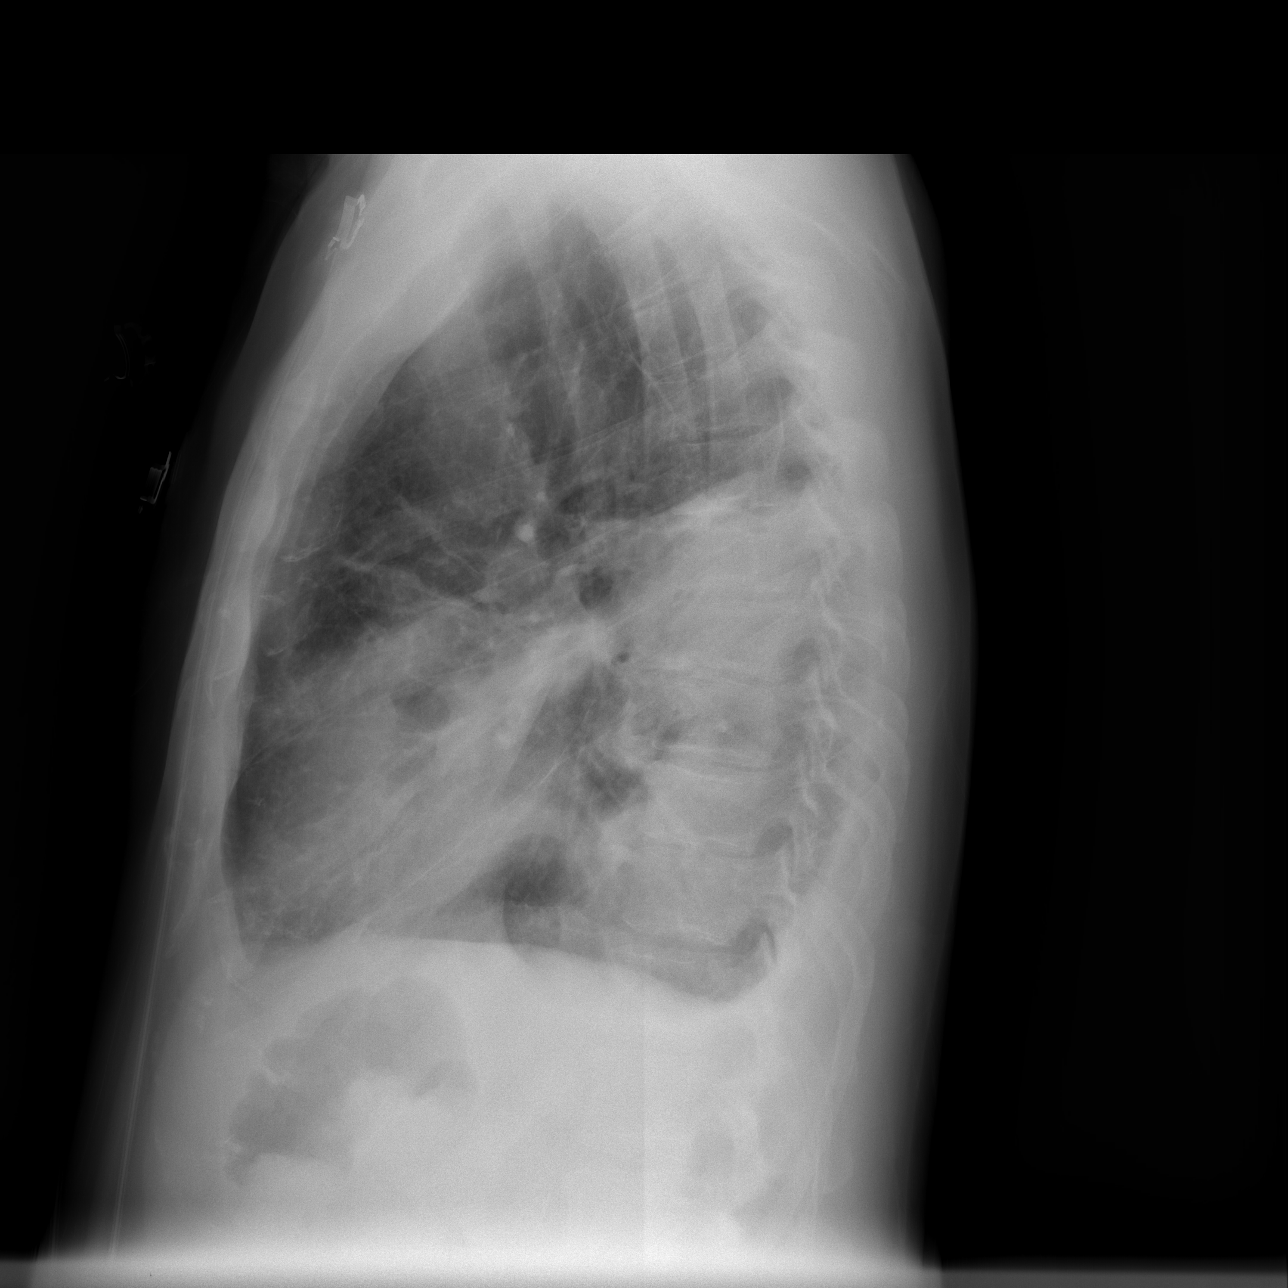

[3 of 3 positions shown; findings below may reference images not displayed]

FINDINGS: Right lung is clear. Stable left lower lobe opacity consistent with
pneumonia or atelectasis with associated pleural effusion. The
visualized portions of the cardiomediastinal silhouette appear
normal. No pneumothorax is seen. Minimal right pleural effusion is
noted.
IMPRESSION: Stable left lung opacity consistent with pneumonia or atelectasis
with associated pleural effusion.

## 2014-07-22 IMAGING — CR DG CHEST 1V
1 series · 1 of 1 positions shown · non-contrast
Comparison: October 17, 2012

CLINICAL DATA: Post thoracentesis

EXAM:
CHEST - 1 VIEW

[w chest pa]
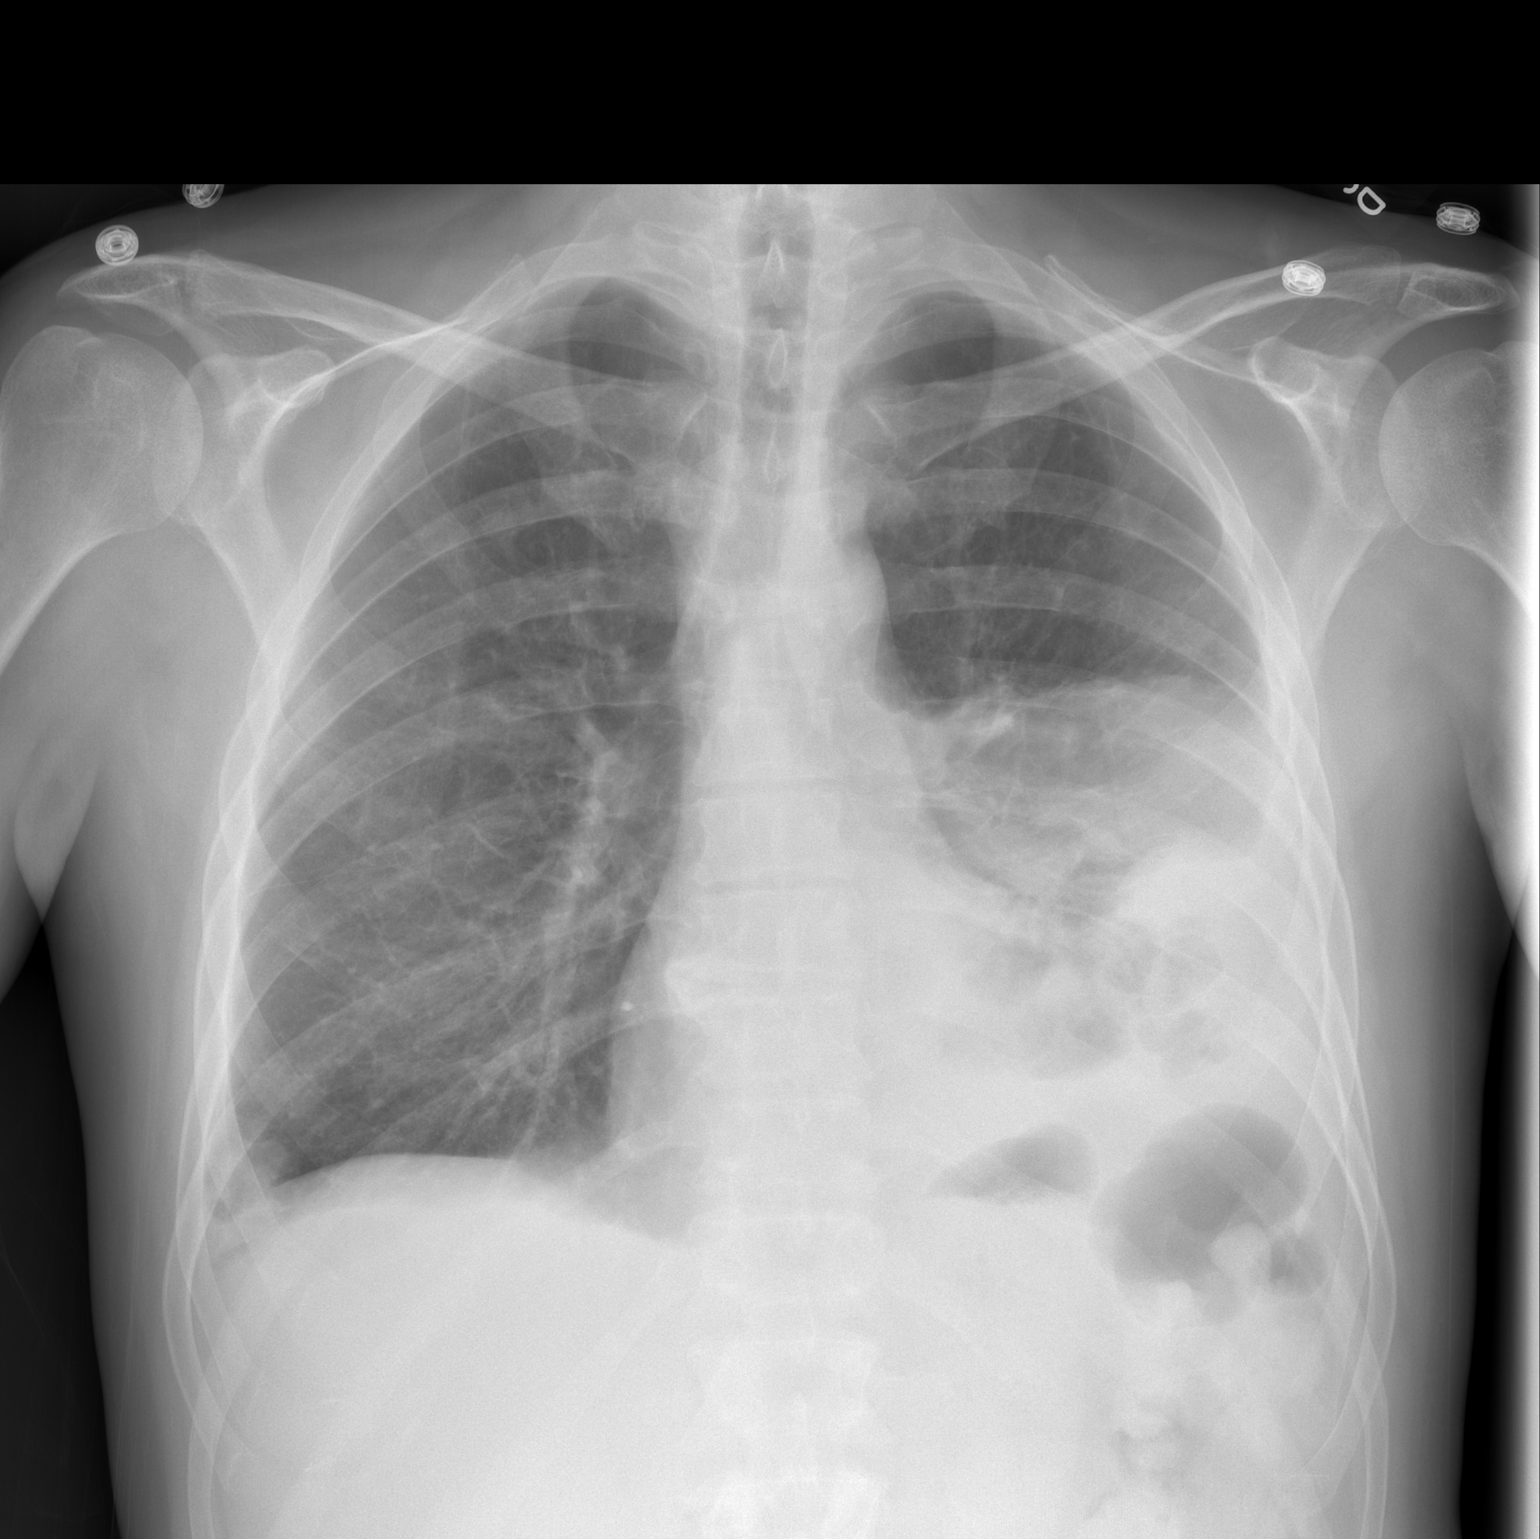

[1 of 1 positions shown; findings below may reference images not displayed]

FINDINGS: There is no pneumothorax. There is persistent elevation of the left
hemidiaphragm with consolidation effusion on the left. There is a
small area of infiltrate in the lateral right base, stable. There is
no new opacity. Heart size and pulmonary vascularity are normal. No
apparent adenopathy.
IMPRESSION: Persistent consolidation and effusion on the left. Small area of
infiltrate lateral right base. No pneumothorax.

## 2014-08-01 IMAGING — CR DG CHEST 2V
2 series · 2 of 2 positions shown · non-contrast
Comparison: 10/19/2012

CLINICAL DATA: Shortness of breath, followup pneumonia

EXAM:
CHEST  2 VIEW

[view not recorded (1 of 2)]
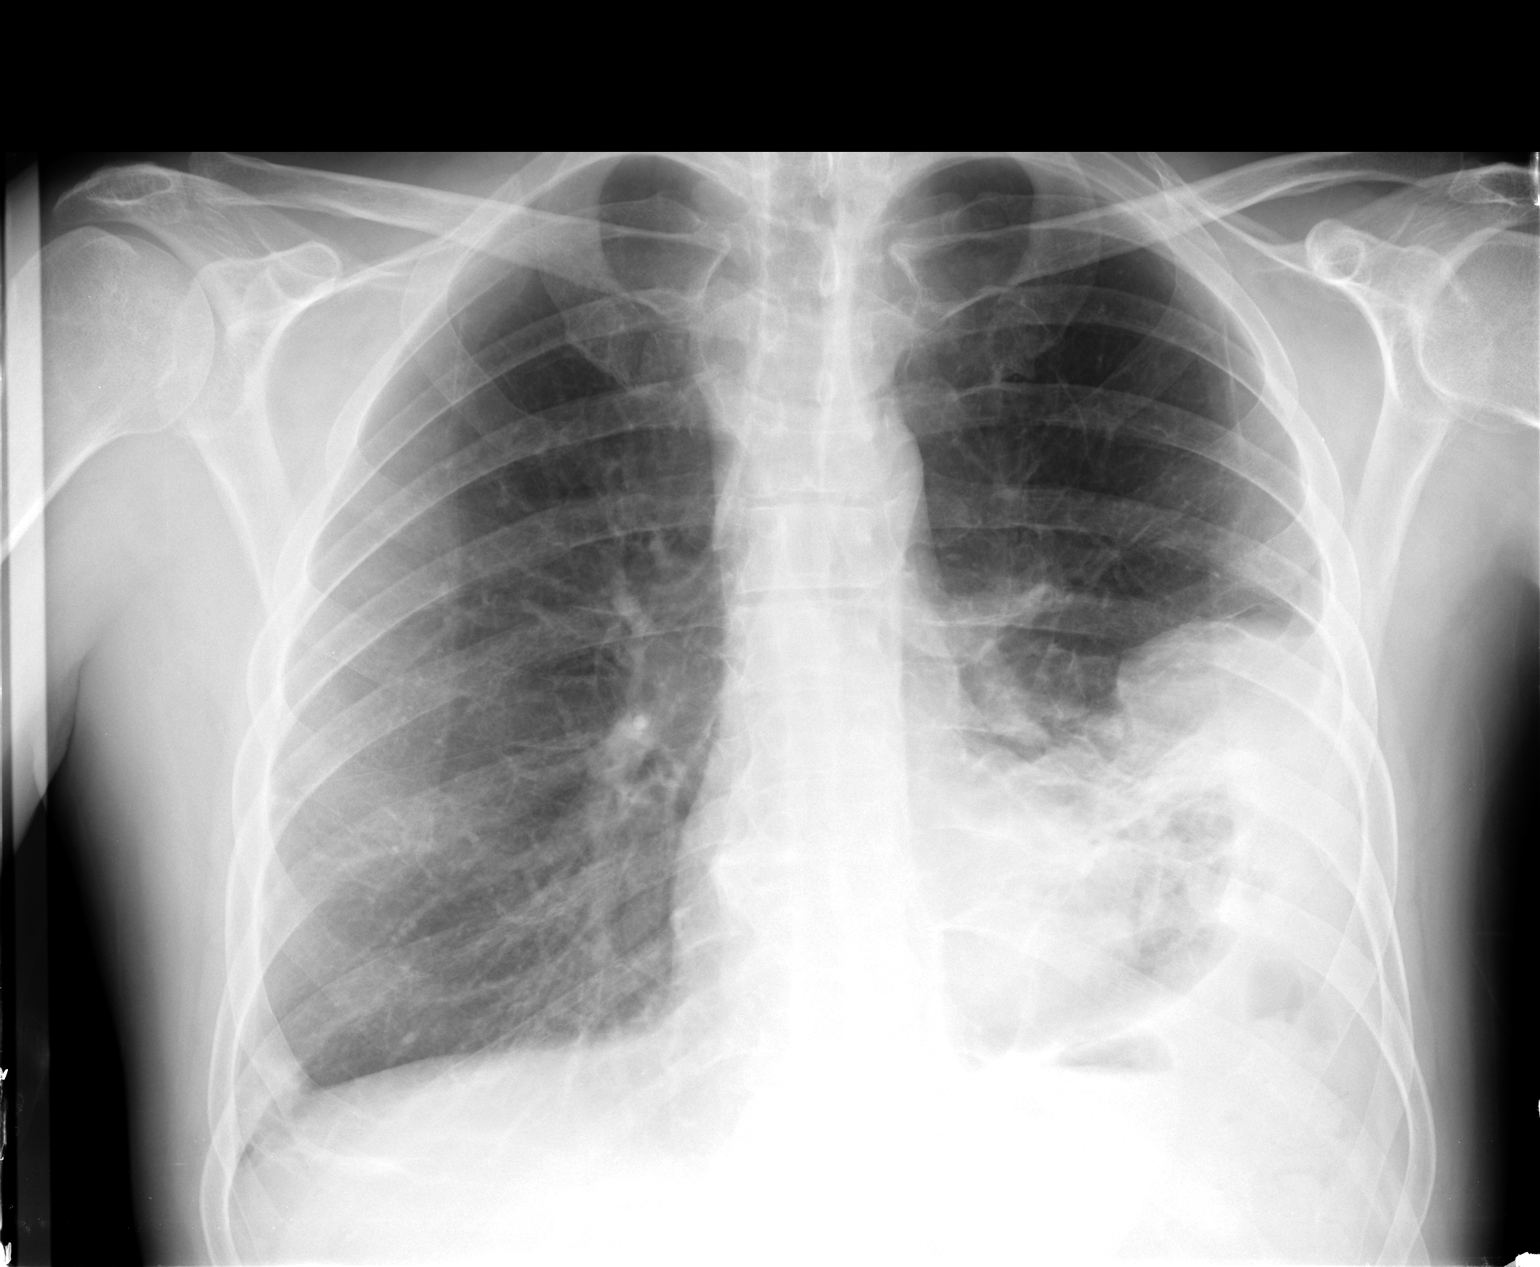

[view not recorded (2 of 2)]
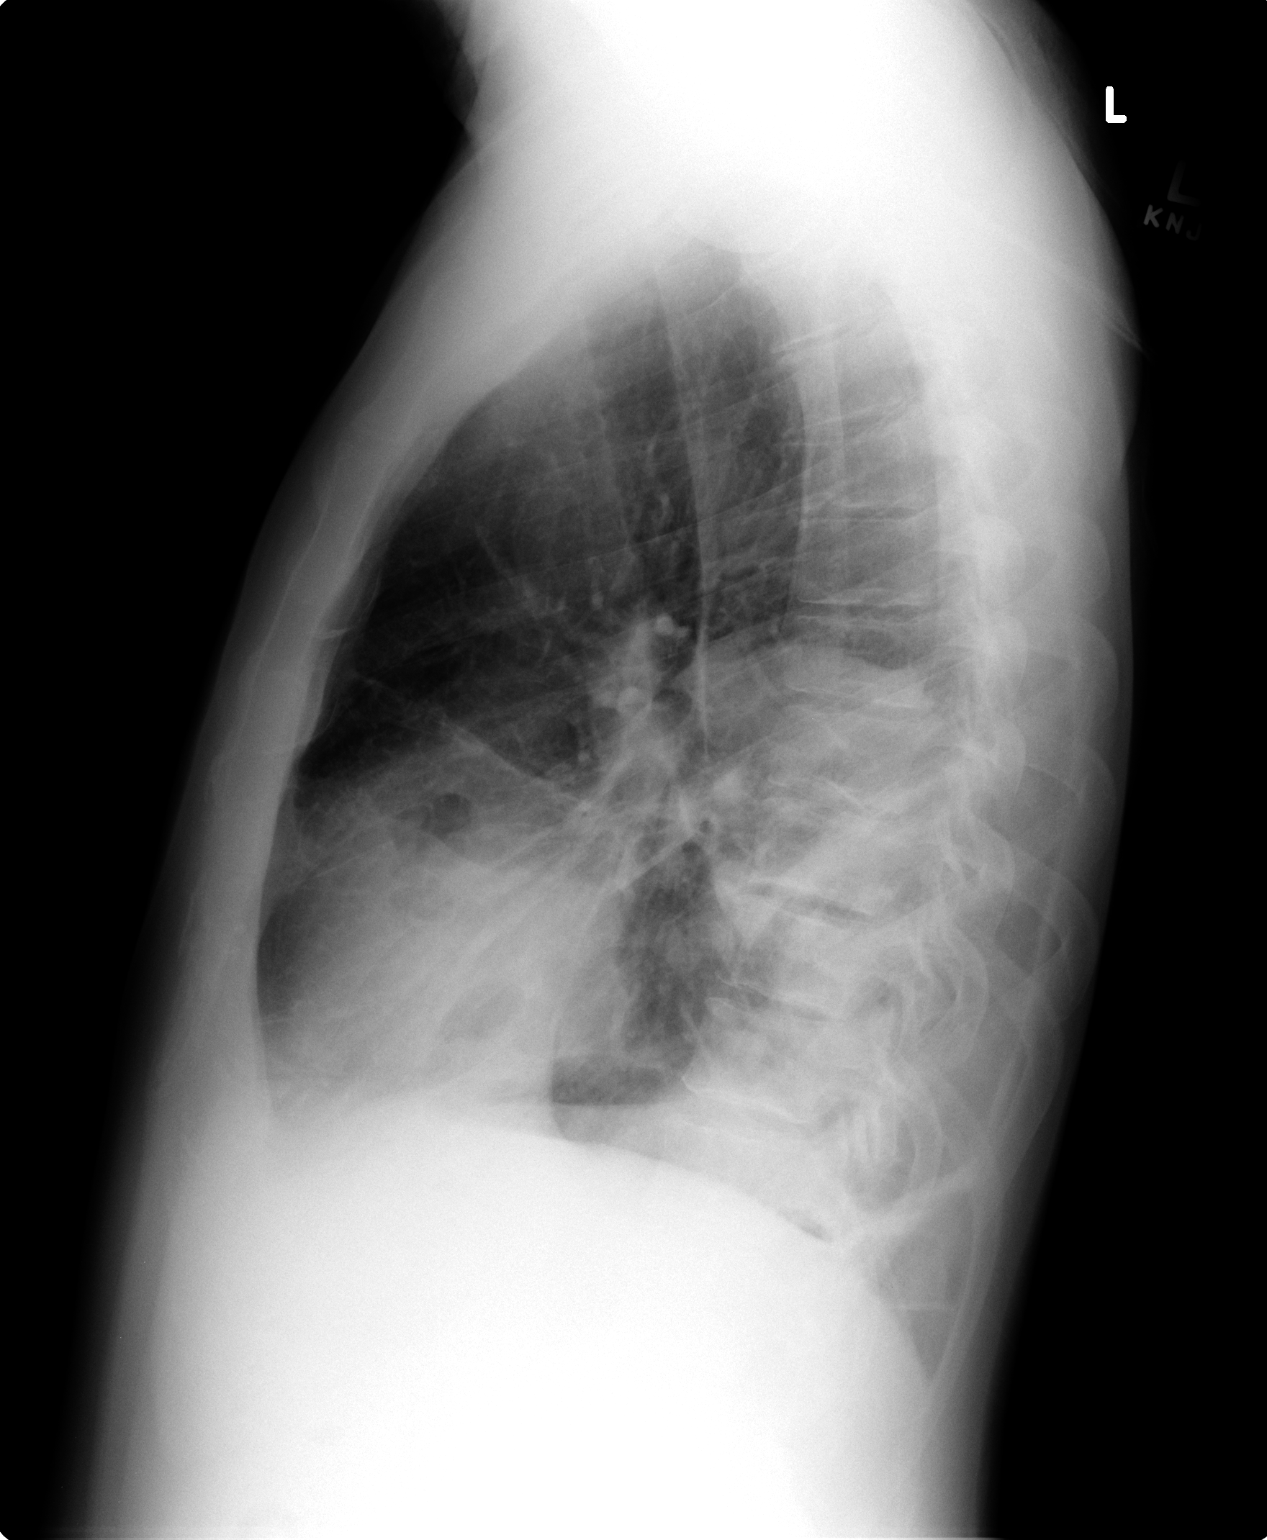

[2 of 2 positions shown; findings below may reference images not displayed]

FINDINGS: The cardiac shadow is stable. The right lung remains clear with
minimal scarring laterally. There is persistent infiltrate and
effusion in the left base stable from the prior exam. No new focal
abnormality is seen.
IMPRESSION: Persistent infiltrate and apparent loculated effusion on the left.

## 2016-11-28 ENCOUNTER — Inpatient Hospital Stay (HOSPITAL_COMMUNITY): Payer: PRIVATE HEALTH INSURANCE

## 2016-11-28 ENCOUNTER — Encounter (HOSPITAL_COMMUNITY): Payer: Self-pay | Admitting: Student

## 2016-11-28 ENCOUNTER — Other Ambulatory Visit: Payer: Self-pay

## 2016-11-28 ENCOUNTER — Inpatient Hospital Stay (HOSPITAL_COMMUNITY)
Admission: EM | Admit: 2016-11-28 | Discharge: 2016-12-03 | DRG: 896 | Disposition: A | Discharge status: Home / Self Care | Payer: PRIVATE HEALTH INSURANCE | Attending: Internal Medicine | Admitting: Internal Medicine

## 2016-11-28 ENCOUNTER — Emergency Department (HOSPITAL_COMMUNITY): Payer: PRIVATE HEALTH INSURANCE

## 2016-11-28 DIAGNOSIS — R059 Cough, unspecified: Secondary | ICD-10-CM

## 2016-11-28 DIAGNOSIS — F17210 Nicotine dependence, cigarettes, uncomplicated: Secondary | ICD-10-CM | POA: Diagnosis present

## 2016-11-28 DIAGNOSIS — F10239 Alcohol dependence with withdrawal, unspecified: Secondary | ICD-10-CM

## 2016-11-28 DIAGNOSIS — Y990 Civilian activity done for income or pay: Secondary | ICD-10-CM | POA: Diagnosis not present

## 2016-11-28 DIAGNOSIS — S50311A Abrasion of right elbow, initial encounter: Secondary | ICD-10-CM | POA: Diagnosis present

## 2016-11-28 DIAGNOSIS — B958 Unspecified staphylococcus as the cause of diseases classified elsewhere: Secondary | ICD-10-CM | POA: Diagnosis present

## 2016-11-28 DIAGNOSIS — Z716 Tobacco abuse counseling: Secondary | ICD-10-CM | POA: Diagnosis not present

## 2016-11-28 DIAGNOSIS — W19XXXA Unspecified fall, initial encounter: Secondary | ICD-10-CM | POA: Diagnosis not present

## 2016-11-28 DIAGNOSIS — G40509 Epileptic seizures related to external causes, not intractable, without status epilepticus: Secondary | ICD-10-CM | POA: Diagnosis present

## 2016-11-28 DIAGNOSIS — R740 Nonspecific elevation of levels of transaminase and lactic acid dehydrogenase [LDH]: Secondary | ICD-10-CM | POA: Diagnosis present

## 2016-11-28 DIAGNOSIS — N390 Urinary tract infection, site not specified: Secondary | ICD-10-CM | POA: Diagnosis present

## 2016-11-28 DIAGNOSIS — R569 Unspecified convulsions: Secondary | ICD-10-CM

## 2016-11-28 DIAGNOSIS — Z96643 Presence of artificial hip joint, bilateral: Secondary | ICD-10-CM | POA: Diagnosis present

## 2016-11-28 DIAGNOSIS — Z79899 Other long term (current) drug therapy: Secondary | ICD-10-CM

## 2016-11-28 DIAGNOSIS — D539 Nutritional anemia, unspecified: Secondary | ICD-10-CM | POA: Diagnosis present

## 2016-11-28 DIAGNOSIS — R509 Fever, unspecified: Secondary | ICD-10-CM | POA: Diagnosis present

## 2016-11-28 DIAGNOSIS — D649 Anemia, unspecified: Secondary | ICD-10-CM | POA: Diagnosis present

## 2016-11-28 DIAGNOSIS — J690 Pneumonitis due to inhalation of food and vomit: Secondary | ICD-10-CM | POA: Diagnosis present

## 2016-11-28 DIAGNOSIS — F17200 Nicotine dependence, unspecified, uncomplicated: Secondary | ICD-10-CM | POA: Diagnosis present

## 2016-11-28 DIAGNOSIS — D696 Thrombocytopenia, unspecified: Secondary | ICD-10-CM | POA: Diagnosis present

## 2016-11-28 DIAGNOSIS — R945 Abnormal results of liver function studies: Secondary | ICD-10-CM | POA: Diagnosis present

## 2016-11-28 DIAGNOSIS — S51812A Laceration without foreign body of left forearm, initial encounter: Secondary | ICD-10-CM | POA: Diagnosis present

## 2016-11-28 DIAGNOSIS — I951 Orthostatic hypotension: Secondary | ICD-10-CM

## 2016-11-28 DIAGNOSIS — S0003XA Contusion of scalp, initial encounter: Secondary | ICD-10-CM | POA: Diagnosis present

## 2016-11-28 DIAGNOSIS — F1010 Alcohol abuse, uncomplicated: Secondary | ICD-10-CM | POA: Diagnosis not present

## 2016-11-28 DIAGNOSIS — Z23 Encounter for immunization: Secondary | ICD-10-CM

## 2016-11-28 DIAGNOSIS — J189 Pneumonia, unspecified organism: Secondary | ICD-10-CM | POA: Diagnosis not present

## 2016-11-28 DIAGNOSIS — E538 Deficiency of other specified B group vitamins: Secondary | ICD-10-CM | POA: Diagnosis present

## 2016-11-28 DIAGNOSIS — F10939 Alcohol use, unspecified with withdrawal, unspecified: Secondary | ICD-10-CM

## 2016-11-28 DIAGNOSIS — F10231 Alcohol dependence with withdrawal delirium: Secondary | ICD-10-CM | POA: Diagnosis present

## 2016-11-28 DIAGNOSIS — R7989 Other specified abnormal findings of blood chemistry: Secondary | ICD-10-CM | POA: Diagnosis present

## 2016-11-28 DIAGNOSIS — Z803 Family history of malignant neoplasm of breast: Secondary | ICD-10-CM | POA: Diagnosis not present

## 2016-11-28 DIAGNOSIS — I10 Essential (primary) hypertension: Secondary | ICD-10-CM | POA: Diagnosis present

## 2016-11-28 DIAGNOSIS — R05 Cough: Secondary | ICD-10-CM

## 2016-11-28 DIAGNOSIS — E876 Hypokalemia: Secondary | ICD-10-CM | POA: Diagnosis present

## 2016-11-28 LAB — CBC WITH DIFFERENTIAL/PLATELET
BASOS ABS: 0 10*3/uL (ref 0.0–0.1)
Basophils Relative: 0 %
EOS ABS: 0 10*3/uL (ref 0.0–0.7)
Eosinophils Relative: 0 %
HCT: 31.6 % — ABNORMAL LOW (ref 39.0–52.0)
Hemoglobin: 11.4 g/dL — ABNORMAL LOW (ref 13.0–17.0)
LYMPHS ABS: 0.6 10*3/uL — AB (ref 0.7–4.0)
Lymphocytes Relative: 9 %
MCH: 39.9 pg — ABNORMAL HIGH (ref 26.0–34.0)
MCHC: 36.1 g/dL — ABNORMAL HIGH (ref 30.0–36.0)
MCV: 110.5 fL — AB (ref 78.0–100.0)
MONO ABS: 0.5 10*3/uL (ref 0.1–1.0)
Monocytes Relative: 7 %
NEUTROS PCT: 84 %
Neutro Abs: 5.9 10*3/uL (ref 1.7–7.7)
PLATELETS: 130 10*3/uL — AB (ref 150–400)
RBC: 2.86 MIL/uL — AB (ref 4.22–5.81)
RDW: 12.8 % (ref 11.5–15.5)
WBC: 7 10*3/uL (ref 4.0–10.5)

## 2016-11-28 LAB — URINALYSIS, ROUTINE W REFLEX MICROSCOPIC
Bilirubin Urine: NEGATIVE
Glucose, UA: 50 mg/dL — AB
Ketones, ur: NEGATIVE mg/dL
LEUKOCYTES UA: NEGATIVE
NITRITE: NEGATIVE
PH: 7 (ref 5.0–8.0)
Protein, ur: NEGATIVE mg/dL
SPECIFIC GRAVITY, URINE: 1.008 (ref 1.005–1.030)
Squamous Epithelial / LPF: NONE SEEN

## 2016-11-28 LAB — CBG MONITORING, ED: GLUCOSE-CAPILLARY: 182 mg/dL — AB (ref 65–99)

## 2016-11-28 LAB — COMPREHENSIVE METABOLIC PANEL
ALK PHOS: 108 U/L (ref 38–126)
ALT: 27 U/L (ref 17–63)
AST: 104 U/L — AB (ref 15–41)
Albumin: 3.8 g/dL (ref 3.5–5.0)
Anion gap: 18 — ABNORMAL HIGH (ref 5–15)
CALCIUM: 8.3 mg/dL — AB (ref 8.9–10.3)
CHLORIDE: 86 mmol/L — AB (ref 101–111)
CO2: 26 mmol/L (ref 22–32)
CREATININE: 0.54 mg/dL — AB (ref 0.61–1.24)
GFR calc Af Amer: >60 mL/min (ref >60)
Glucose, Bld: 137 mg/dL — ABNORMAL HIGH (ref 65–99)
Potassium: 3.3 mmol/L — ABNORMAL LOW (ref 3.5–5.1)
Sodium: 130 mmol/L — ABNORMAL LOW (ref 135–145)
Total Bilirubin: 2.6 mg/dL — ABNORMAL HIGH (ref 0.3–1.2)
Total Protein: 7 g/dL (ref 6.5–8.1)

## 2016-11-28 LAB — MAGNESIUM: MAGNESIUM: 0.9 mg/dL — AB (ref 1.7–2.4)

## 2016-11-28 LAB — ETHANOL

## 2016-11-28 MED ORDER — ZOLPIDEM TARTRATE 5 MG PO TABS: 5.0000 mg | ORAL_TABLET | Freq: Every evening | ORAL | Status: DC | PRN

## 2016-11-28 MED ORDER — SODIUM CHLORIDE 0.9 % IV BOLUS (SEPSIS)
1000.0000 mL | Freq: Once | INTRAVENOUS | Status: AC
  Administered 2016-11-28: 1000 mL via INTRAVENOUS

## 2016-11-28 MED ORDER — THIAMINE HCL 100 MG/ML IJ SOLN
100.0000 mg | Freq: Every day | INTRAMUSCULAR | Status: DC
  Administered 2016-11-28: 100 mg via INTRAVENOUS
  Filled 2016-11-28: qty 2

## 2016-11-28 MED ORDER — FOLIC ACID 1 MG PO TABS
1.0000 mg | ORAL_TABLET | Freq: Every day | ORAL | Status: DC
  Administered 2016-11-28: 1 mg via ORAL
  Filled 2016-11-28: qty 1

## 2016-11-28 MED ORDER — LORAZEPAM 2 MG/ML IJ SOLN: 0.0000 mg | Freq: Two times a day (BID) | INTRAMUSCULAR | Status: AC

## 2016-11-28 MED ORDER — VITAMIN B-1 100 MG PO TABS
100.0000 mg | ORAL_TABLET | Freq: Every day | ORAL | Status: DC
  Administered 2016-11-30 – 2016-12-03 (×4): 100 mg via ORAL
  Filled 2016-11-28 (×4): qty 1

## 2016-11-28 MED ORDER — LORAZEPAM 2 MG/ML IJ SOLN
1.0000 mg | INTRAMUSCULAR | Status: DC | PRN
  Filled 2016-11-28 (×2): qty 1

## 2016-11-28 MED ORDER — MAGNESIUM SULFATE IN D5W 1-5 GM/100ML-% IV SOLN
1.0000 g | Freq: Once | INTRAVENOUS | Status: DC
  Filled 2016-11-28: qty 100

## 2016-11-28 MED ORDER — ADULT MULTIVITAMIN W/MINERALS CH
1.0000 | ORAL_TABLET | Freq: Every day | ORAL | Status: DC
  Administered 2016-11-29 – 2016-12-03 (×5): 1 via ORAL
  Filled 2016-11-28 (×5): qty 1

## 2016-11-28 MED ORDER — LORAZEPAM 2 MG/ML IJ SOLN
1.0000 mg | Freq: Four times a day (QID) | INTRAMUSCULAR | Status: AC | PRN
  Administered 2016-11-29 (×2): 1 mg via INTRAVENOUS
  Filled 2016-11-28: qty 1

## 2016-11-28 MED ORDER — LORAZEPAM 2 MG/ML IJ SOLN
1.0000 mg | Freq: Once | INTRAMUSCULAR | Status: AC
  Administered 2016-11-28: 1 mg via INTRAVENOUS
  Filled 2016-11-28: qty 1

## 2016-11-28 MED ORDER — POTASSIUM CHLORIDE CRYS ER 20 MEQ PO TBCR
20.0000 meq | EXTENDED_RELEASE_TABLET | Freq: Once | ORAL | Status: AC
  Administered 2016-11-28: 20 meq via ORAL
  Filled 2016-11-28: qty 1

## 2016-11-28 MED ORDER — MAGNESIUM OXIDE 400 (241.3 MG) MG PO TABS
800.0000 mg | ORAL_TABLET | Freq: Once | ORAL | Status: AC
  Administered 2016-11-28: 800 mg via ORAL
  Filled 2016-11-28: qty 2

## 2016-11-28 MED ORDER — NICOTINE 21 MG/24HR TD PT24
21.0000 mg | MEDICATED_PATCH | Freq: Every day | TRANSDERMAL | Status: DC
  Administered 2016-11-29 – 2016-12-03 (×4): 21 mg via TRANSDERMAL
  Filled 2016-11-28 (×4): qty 1

## 2016-11-28 MED ORDER — ADULT MULTIVITAMIN W/MINERALS CH
1.0000 | ORAL_TABLET | Freq: Every day | ORAL | Status: DC
  Administered 2016-11-28: 1 via ORAL
  Filled 2016-11-28: qty 1

## 2016-11-28 MED ORDER — FOLIC ACID 1 MG PO TABS
1.0000 mg | ORAL_TABLET | Freq: Every day | ORAL | Status: DC
  Administered 2016-11-29 – 2016-12-03 (×5): 1 mg via ORAL
  Filled 2016-11-28 (×4): qty 1

## 2016-11-28 MED ORDER — LORAZEPAM 1 MG PO TABS: 1.0000 mg | ORAL_TABLET | Freq: Four times a day (QID) | ORAL | Status: DC | PRN

## 2016-11-28 MED ORDER — LORAZEPAM 2 MG/ML IJ SOLN
0.0000 mg | Freq: Four times a day (QID) | INTRAMUSCULAR | Status: AC
  Administered 2016-11-29 (×4): 2 mg via INTRAVENOUS
  Administered 2016-11-29: 4 mg via INTRAVENOUS
  Administered 2016-11-30: 2 mg via INTRAVENOUS
  Filled 2016-11-28 (×3): qty 1
  Filled 2016-11-28: qty 2
  Filled 2016-11-28 (×3): qty 1

## 2016-11-28 MED ORDER — SODIUM CHLORIDE 0.9 % IV SOLN
INTRAVENOUS | Status: DC
  Administered 2016-11-29: via INTRAVENOUS

## 2016-11-28 MED ORDER — LORAZEPAM 1 MG PO TABS: 1.0000 mg | ORAL_TABLET | Freq: Four times a day (QID) | ORAL | Status: AC | PRN

## 2016-11-28 MED ORDER — LORAZEPAM 2 MG/ML IJ SOLN: 1.0000 mg | Freq: Four times a day (QID) | INTRAMUSCULAR | Status: DC | PRN

## 2016-11-28 MED ORDER — CHLORDIAZEPOXIDE HCL 5 MG PO CAPS
5.0000 mg | ORAL_CAPSULE | Freq: Three times a day (TID) | ORAL | Status: DC
  Administered 2016-11-28: 5 mg via ORAL
  Filled 2016-11-28: qty 1

## 2016-11-28 MED ORDER — MAGNESIUM SULFATE IN D5W 1-5 GM/100ML-% IV SOLN
1.0000 g | Freq: Once | INTRAVENOUS | Status: AC
  Administered 2016-11-28: 1 g via INTRAVENOUS
  Filled 2016-11-28: qty 100

## 2016-11-28 MED ORDER — MAGNESIUM SULFATE 50 % IJ SOLN
1.0000 g | Freq: Once | INTRAMUSCULAR | Status: DC
  Filled 2016-11-28: qty 2

## 2016-11-28 MED ORDER — CLONIDINE HCL 0.1 MG PO TABS
0.1000 mg | ORAL_TABLET | Freq: Three times a day (TID) | ORAL | Status: DC
  Administered 2016-11-28 – 2016-11-29 (×3): 0.1 mg via ORAL
  Filled 2016-11-28 (×3): qty 1

## 2016-11-28 MED ORDER — POTASSIUM CHLORIDE 10 MEQ/100ML IV SOLN
10.0000 meq | Freq: Once | INTRAVENOUS | Status: AC
  Administered 2016-11-28: 10 meq via INTRAVENOUS
  Filled 2016-11-28: qty 100

## 2016-11-28 MED ORDER — VITAMIN B-1 100 MG PO TABS
100.0000 mg | ORAL_TABLET | Freq: Every day | ORAL | Status: DC
  Filled 2016-11-28: qty 1

## 2016-11-28 MED ORDER — DM-GUAIFENESIN ER 30-600 MG PO TB12
1.0000 | ORAL_TABLET | Freq: Two times a day (BID) | ORAL | Status: DC | PRN
Start: 2016-11-28 — End: 2016-11-29

## 2016-11-28 MED ORDER — THIAMINE HCL 100 MG/ML IJ SOLN
100.0000 mg | Freq: Every day | INTRAMUSCULAR | Status: DC
  Administered 2016-11-29: 100 mg via INTRAVENOUS
  Filled 2016-11-28: qty 2

## 2016-11-28 MED ORDER — HYDRALAZINE HCL 20 MG/ML IJ SOLN: 5.0000 mg | INTRAMUSCULAR | Status: DC | PRN

## 2016-11-28 MED ORDER — HYDROXYZINE HCL 10 MG PO TABS
10.0000 mg | ORAL_TABLET | Freq: Three times a day (TID) | ORAL | Status: DC | PRN
  Filled 2016-11-28: qty 1

## 2016-11-28 NOTE — ED Notes (Signed)
Pt's clothing saturated in urine. Pt refused to let us clean him up and change him. RN aware.

## 2016-11-28 NOTE — ED Notes (Signed)
Bed: WA07 Expected date:  Expected time:  Means of arrival:  Comments: EMS-SZ

## 2016-11-28 NOTE — ED Notes (Signed)
Call report to [facility] at [phone] at [place]

## 2016-11-28 NOTE — H&P (Addendum)
History and Physical    Eugene Jackson YBO:[id number] DOB: [birth year] DOA: 11/28/2016  Referring MD/NP/PA:   PCP: [name], MD   Patient coming from:  The patient is coming from home.  At baseline, pt is independent for most of ADL.   Chief Complaint: Seizure  HPI: Eugene Jackson is a 46 y.o. male with medical history significant of alcohol abuse, alcohol withdrawal seizure, tobacco abuse, hypertension, who presents with seizure.  Per his daughter, pt had an unwitnessed seizure and found on the ground at about 10:20 AM. Unsure how long he was down. Pt was noted to have left sided posterior hematoma to the head, right elbow abrasion, and left forearm laceration. When I saw pt in ED, he is oriented 3. He is tremulous, but no active seizure. Patient denies unilateral weakness, numbness or tingliness extremities. No facial droop, hearing loss, vision change. Patient denies chest pain. He states that he has cough with thick mucus production, but no shortness of breath, fever or chills. Denies nausea, vomiting, diarrhea or abdominal pain. No symptoms of UTI. He reports that his last drink was last night between 19:00 and 20:00. He had withdrawal seizure previously, last one was 3 years ago. He typically drinks 1 gallon of vodka per week.    ED Course: pt was found to have WBC 7.0, potassium 3.3, Tylenol level less than 10, negative urinalysis, potassium 3.3, magnesium level 0.9, kidney functioning okay, tachycardia, tachypnea, oxygen saturation 88-99% on room air. CT head is negative for acute intracranial abnormalities, but showed left superior scalp soft tissue injury. Patient is admitted to telemetry bed as inpatient.  Review of Systems:   General: no fevers, chills, no body weight gain, has fatigue HEENT: no blurry vision, hearing changes or sore throat Respiratory: no dyspnea, has coughing, no wheezing CV: no chest pain, no palpitations GI: no nausea, vomiting, abdominal  pain, diarrhea, constipation GU: no dysuria, burning on urination, increased urinary frequency, hematuria  Ext: no leg edema Neuro: no unilateral weakness, numbness, or tingling, no vision change or hearing loss. Had seizure. Skin: has skin laceration.  MSK: No muscle spasm, no deformity, no limitation of range of movement in spin Heme: No easy bruising.  Travel history: No recent long distant travel.  Allergy: No Known Allergies  Past Medical History:  Diagnosis Date  . Broken femur ([place])    right  . Hypertension   . PONV (postoperative nausea and vomiting)     Past Surgical History:  Procedure Laterality Date  . JOINT REPLACEMENT     bilateral hip replacement  . WRIST SURGERY      Social History:  reports that he has been smoking cigarettes.  He has a 2.60 pack-year smoking history. he has never used smokeless tobacco. He reports that he drinks alcohol. He reports that he does not use drugs.  Family History:  Family History  Problem Relation Age of Onset  . Cancer Mother 63       breast ca  . Cancer Father      Prior to Admission medications   Medication Sig Start Date End Date Taking? Authorizing Provider  Multiple Vitamin (MULTIVITAMIN WITH MINERALS) TABS tablet Take 1 tablet by mouth daily. 09/07/12  Yes Robbie Lis, MD    Physical Exam: Vitals:   11/28/16 1845 11/28/16 1900 11/28/16 1915 11/28/16 1930  BP:  (!) 140/100  135/88  Pulse: 93 97 (!) 101 (!) 107  Resp: (!) 29 (!) 21 14 (!) 23  Temp:      TempSrc:      SpO2: 96% 96% 96% 90%   General: Not in acute distress. Tremulous. HEENT:       Eyes: PERRL, EOMI, no scleral icterus.       ENT: No discharge from the ears and nose, no pharynx injection, no tonsillar enlargement.        Neck: No JVD, no bruit, no mass felt. Heme: No neck lymph node enlargement. Cardiac: S1/S2, RRR, No murmurs, No gallops or rubs. Respiratory:  No rales, wheezing, rhonchi or rubs. GI: Soft, nondistended, nontender, no rebound  pain, no organomegaly, BS present. GU: No hematuria Ext: No pitting leg edema bilaterally. 2+DP/PT pulse bilaterally. Musculoskeletal: No joint deformities, No joint redness or warmth, no limitation of ROM in spin. Skin: has right elbow abrasion, and left forearm laceration. Neuro: Alert, oriented X3, cranial nerves II-XII grossly intact, moves all extremities normally. Muscle strength 5/5 in all extremities, sensation to light touch intact. Brachial reflex 2+ bilaterally. Psych: Patient is not psychotic, no suicidal or hemocidal ideation.  Labs on Admission: I have personally reviewed following labs and imaging studies  CBC: Recent Labs  Lab 11/28/16 1230  WBC 7.0  NEUTROABS 5.9  HGB 11.4*  HCT 31.6*  MCV 110.5*  PLT 756*   Basic Metabolic Panel: Recent Labs  Lab 11/28/16 1230  NA 130*  K 3.3*  CL 86*  CO2 26  GLUCOSE 137*  BUN <5*  CREATININE 0.54*  CALCIUM 8.3*  MG 0.9*   GFR: CrCl cannot be calculated (Unknown ideal weight.). Liver Function Tests: Recent Labs  Lab 11/28/16 1230  AST 104*  ALT 27  ALKPHOS 108  BILITOT 2.6*  PROT 7.0  ALBUMIN 3.8   No results for input(s): LIPASE, AMYLASE in the last 168 hours. No results for input(s): AMMONIA in the last 168 hours. Coagulation Profile: No results for input(s): INR, PROTIME in the last 168 hours. Cardiac Enzymes: No results for input(s): CKTOTAL, CKMB, CKMBINDEX, TROPONINI in the last 168 hours. BNP (last 3 results) No results for input(s): PROBNP in the last 8760 hours. HbA1C: No results for input(s): HGBA1C in the last 72 hours. CBG: Recent Labs  Lab 11/28/16 1155  GLUCAP 182*   Lipid Profile: No results for input(s): CHOL, HDL, LDLCALC, TRIG, CHOLHDL, LDLDIRECT in the last 72 hours. Thyroid Function Tests: No results for input(s): TSH, T4TOTAL, FREET4, T3FREE, THYROIDAB in the last 72 hours. Anemia Panel: No results for input(s): VITAMINB12, FOLATE, FERRITIN, TIBC, IRON, RETICCTPCT in the last  72 hours. Urine analysis:    Component Value Date/Time   COLORURINE YELLOW 11/28/2016 [place] 11/28/2016 1256   LABSPEC 1.008 11/28/2016 1256   PHURINE 7.0 11/28/2016 1256   GLUCOSEU 50 (A) 11/28/2016 1256   HGBUR SMALL (A) 11/28/2016 1256   BILIRUBINUR NEGATIVE 11/28/2016 1256   KETONESUR NEGATIVE 11/28/2016 1256   PROTEINUR NEGATIVE 11/28/2016 1256   UROBILINOGEN 1.0 10/10/2012 1902   NITRITE NEGATIVE 11/28/2016 1256   LEUKOCYTESUR NEGATIVE 11/28/2016 1256   Sepsis Labs: @LABRCNTIP (procalcitonin:4,lacticidven:4) )No results found for this or any previous visit (from the past 240 hour(s)).   Radiological Exams on Admission: Ct Head Wo Contrast  Result Date: 11/28/2016 CLINICAL DATA:  46 y/o male found down. Un-witnessed seizure. Postictal on EMS arrival. EXAM: CT HEAD WITHOUT CONTRAST TECHNIQUE: Contiguous axial images were obtained from the base of the skull through the vertex without intravenous contrast. COMPARISON:  Head CT 09/04/2012 and earlier. FINDINGS: Brain: Stable cerebral volume.  No midline shift, ventriculomegaly, mass effect, evidence of mass lesion, intracranial hemorrhage or evidence of cortically based acute infarction. Gray-white matter differentiation is within normal limits throughout the brain. Vascular: Calcified atherosclerosis at the skull base. No suspicious intracranial vascular hyperdensity. Skull: Stable.  No skull fracture identified. Sinuses/Orbits: Chronic but increased partially visible right maxillary sinus opacification. Improved right sphenoid sinus aeration. Other visible paranasal sinuses and mastoids are stable and well pneumatized. Other: Left posterosuperior scalp hematoma or contusion measuring up to 8 mm in thickness. Otherwise negative Visualized orbits and scalp soft tissues are within normal limits. IMPRESSION: 1. Left superior scalp soft tissue injury without underlying fracture. 2. No acute intracranial abnormality. Chronic  brain volume loss but stable and otherwise negative noncontrast CT appearance of the brain. Electronically Signed   By: Genevie Ann M.D.   On: 11/28/2016 13:27     EKG: Independently reviewed.  Sinus tachycardia, QTC 518, LAE.   Assessment/Plan Principal Problem:   Alcohol withdrawal seizure ([place]) Active Problems:   Alcohol abuse   Hypokalemia   Thrombocytopenia ([facility])   Abnormal liver function tests   Smoker   Hypomagnesemia   Essential hypertension   Alcohol withdrawal seizure ([place]): Patient's seizure is most likely due to alcohol withdrawal seizure. He is a heavy drinker and had hx of withdrawal seizures 3 years ago. Ct-head is negative for acute intracranial abnormalities. Patienthas no focal neurological findings on physical examination.  - Admit to telemetry bed as inpatient. - CIWA protocol - start Librium 5 mg 3 times a day - Thiamine & Folate - IVF: 2.0 L NS  - Seizure precaution - When necessary Ativan for seizure - Wound care consult for skin laceration  Essential hypertension: Blood pressure 158/92. Patient not taking the medications at home. - Start clonidine 0.1 mg 3 times a day for hypertension   Tobacco abuse and Alcohol abuse: -Did counseling about importance of quitting smoking and drinking alcohol -Nicotine patch -CIWA protocol  Hypokalemia and hypomagnesemia: -replated both  thrombocytopenia ([place]): platelet 130. No bleeding tendency. Most likely due to alcohol abuse. -Follow-up by CBC  Abnormal liver function tests: AST 104, ALT 27, normal ALP. Total BR is 2.6. Most likely due to alcohol abuse. -check HIV ab and hepatitis panel -OH Tylenol   DVT ppx: SCD Code Status: Full code Family Communication:  Yes, patient's  daughter and ex-wife  at bed side Disposition Plan:  Anticipate discharge back to previous home environment Consults called:  none Admission status:   Inpatient/tele    Date of Service 11/28/2016    Ivor Costa [facility] Pager [phone]  If 7PM-7AM, please contact night-coverage [email or web address] Password [facility] 11/28/2016, 10:47 PM

## 2016-11-28 NOTE — ED Notes (Signed)
Room 1408

## 2016-11-28 NOTE — ED Notes (Signed)
New room 1404

## 2016-11-28 NOTE — ED Notes (Signed)
CBG- 182 

## 2016-11-28 NOTE — ED Triage Notes (Signed)
Per EMS, Pt from work with unwitnessed seizure and found on the ground. Unsure how long he was down. Pt has left sided posterior hematoma to the head, right elbow abrasion, and left forearm lac. Hx of seizures. Was post-ichtal on EMS arrival, but AOx4 here. Hx of alcohol withdrawal induced seizures, last drink last night.

## 2016-11-28 NOTE — ED Notes (Signed)
Date and time results received: 11/28/16 1330   Test: Mg  Critical Value: 0.9  Name of Provider Notified: Aldona Bar, PA-C  Orders Received? Or Actions Taken?: awaiting orders

## 2016-11-28 NOTE — ED Provider Notes (Signed)
[facility] DEPT Provider Note   CSN: [id number] Arrival date & time: 11/28/16  1141     History   Chief Complaint Chief Complaint  Patient presents with  . Seizures    HPI Eugene Jackson is a 46 y.o. male with a hx of alcohol abuse and hypertension presents to the ED for possible seizure just prior to arrival. Patient does not recall the event. States that he was at work and felt a bit off balance throughout the morning which is baseline for him, but does not recall anything else until in the ambulance. Per EMS patient had unwitnessed seizure and was found on the ground at work, per EMS was considered post ictal upon their arrival. Upon arrival to the ED patient is alert and oriented, only complaining of continued dizziness, denies confusion, numbness, tingling, weakness, nausea, chest pain, or shortness of breath. Has injury to lower lip, head, R elbow, and L forearm each of which he states is not painful.  Patient states has history of alcohol abuse, reports that his last drink was last night between 19:00 and 20:00-has had withdrawal seizure previously last one was 3 years ago.  Relates he typically drinks 1 gallon of vodka per week.    HPI  Past Medical History:  Diagnosis Date  . Broken femur ([place])    right  . Hypertension   . PONV (postoperative nausea and vomiting)     Patient Active Problem List   Diagnosis Date Noted  . Iron overload 11/09/2012  . Thrombocytosis ([place]) 10/29/2012  . Severe sepsis ([place]) 10/19/2012  . Parapneumonic effusion 10/18/2012  . Hemoptysis 10/15/2012  . Smoker 10/15/2012  . Bacteremia 10/12/2012  . Acute respiratory failure ([place]) 10/10/2012  . HCAP (healthcare-associated pneumonia) 10/10/2012  . Acute alcohol intoxication ([place]) 09/05/2012  . Anemia of chronic disease 09/05/2012  . Abnormal liver function tests 09/05/2012  . Moderate protein-calorie malnutrition ([place]) 09/05/2012  . Seizures ([place]) 09/05/2012   . Thrombocytopenia ([place]) 08/31/2012  . DIC (disseminated intravascular coagulation) ([place]) 08/31/2012  . Alcohol abuse 08/30/2012  . Hyponatremia 08/30/2012  . Hypokalemia 08/30/2012  . Elevated lipase 08/30/2012  . Pancytopenia ([place]) 08/30/2012  . Encephalopathy acute 08/30/2012    Past Surgical History:  Procedure Laterality Date  . JOINT REPLACEMENT     bilateral hip replacement  . WRIST SURGERY         Home Medications    Prior to Admission medications   Medication Sig Start Date End Date Taking? Authorizing Provider  amLODipine (NORVASC) 10 MG tablet Take 1 tablet (10 mg total) by mouth daily. 09/07/12   Robbie Lis, MD  Multiple Vitamin (MULTIVITAMIN WITH MINERALS) TABS tablet Take 1 tablet by mouth daily. 09/07/12   Robbie Lis, MD    Family History Family History  Problem Relation Age of Onset  . Cancer Mother 31       breast ca  . Cancer Father     Social History Social History   Tobacco Use  . Smoking status: Current Some Day Smoker    Packs/day: 0.10    Years: 26.00    Pack years: 2.60    Types: Cigarettes  . Smokeless tobacco: Never Used  . Tobacco comment: not really inhaling right now  Substance Use Topics  . Alcohol use: Yes    Comment: 1/5 th vodka every 2 days  . Drug use: No     Allergies   Patient has no known allergies.   Review of  Systems Review of Systems  Constitutional: Negative for chills and fever.  HENT: Negative for congestion.   Eyes: Negative for visual disturbance.  Respiratory: Negative for chest tightness and shortness of breath.   Cardiovascular: Negative for chest pain. Palpitations: states he is tachycardic at baseline.  Gastrointestinal: Negative for abdominal pain, constipation, diarrhea, nausea and vomiting.  Genitourinary: Negative for dysuria.  Musculoskeletal: Negative for neck pain.  Skin: Positive for wound (L forearm, R elbow, lip). Negative for rash.       Positive for bump to L side of head    Neurological: Positive for dizziness and seizures (Possible, unwitnessed). Negative for weakness and numbness.  Psychiatric/Behavioral: Negative for confusion.  All other systems reviewed and are negative.    Physical Exam Updated Vital Signs SpO2 96%   Physical Exam  Constitutional: He appears well-developed.  HENT:  Right Ear: Tympanic membrane normal. No hemotympanum.  Left Ear: Tympanic membrane normal. No hemotympanum.  4cm diameter hematoma to the L parietal region mild tenderness to palpation. Abrasion to lower lip, no active bleeding.   Eyes: Conjunctivae and EOM are normal. Pupils are equal, round, and reactive to light. Right eye exhibits no discharge. Left eye exhibits no discharge.  Neck: Normal range of motion. No spinous process tenderness present.  Cardiovascular: Regular rhythm. Tachycardia present. Exam reveals no gallop and no friction rub.  No murmur heard. Pulses:      Radial pulses are 2+ on the right side, and 2+ on the left side.  Pulmonary/Chest: Breath sounds normal. No respiratory distress. He has no wheezes. He has no rales.  Abdominal: Soft. He exhibits no distension. There is no tenderness. There is no rigidity, no rebound and no guarding.  Musculoskeletal:  No bony tenderness to upper/lower extremities.   Neurological:  Alert. Clear speech. No facial droop. CNIII-XII are grossly intact. Bilateral upper and lower extremities' sensation intact to sharp and dull touch. 5/5 grip strength bilaterally. 5/5 plantar and dorsi flexion bilaterally. Tremor with finger to nose bilaterally- per patient baseline.  Negative pronator drift. Gait is unsteady, patient with difficulty balancing-states worse than baseline.  Skin: Skin is warm and dry.  Right elbow with 1 centimeter diameter abrasion, no active bleeding.  Left posterior forearm with 3 cm sized skin tear.  No active bleeding.   Psychiatric: He has a normal mood and affect. His behavior is normal.  Nursing  note and vitals reviewed.    ED Treatments / Results  Labs Results for orders placed or performed during the hospital encounter of 11/28/16  CBC WITH DIFFERENTIAL  Result Value Ref Range   WBC 7.0 4.0 - 10.5 K/uL   RBC 2.86 (L) 4.22 - 5.81 MIL/uL   Hemoglobin 11.4 (L) 13.0 - 17.0 g/dL   HCT 31.6 (L) 39.0 - 52.0 %   MCV 110.5 (H) 78.0 - 100.0 fL   MCH 39.9 (H) 26.0 - 34.0 pg   MCHC 36.1 (H) 30.0 - 36.0 g/dL   RDW 12.8 11.5 - 15.5 %   Platelets 130 (L) 150 - 400 K/uL   Neutrophils Relative % 84 %   Lymphocytes Relative 9 %   Monocytes Relative 7 %   Eosinophils Relative 0 %   Basophils Relative 0 %   Neutro Abs 5.9 1.7 - 7.7 K/uL   Lymphs Abs 0.6 (L) 0.7 - 4.0 K/uL   Monocytes Absolute 0.5 0.1 - 1.0 K/uL   Eosinophils Absolute 0.0 0.0 - 0.7 K/uL   Basophils Absolute 0.0 0.0 - 0.1 K/uL  Smear Review MORPHOLOGY UNREMARKABLE   Magnesium  Result Value Ref Range   Magnesium 0.9 (LL) 1.7 - 2.4 mg/dL  Urinalysis, Routine w reflex microscopic  Result Value Ref Range   Color, Urine YELLOW YELLOW   APPearance CLEAR CLEAR   Specific Gravity, Urine 1.008 1.005 - 1.030   pH 7.0 5.0 - 8.0   Glucose, UA 50 (A) NEGATIVE mg/dL   Hgb urine dipstick SMALL (A) NEGATIVE   Bilirubin Urine NEGATIVE NEGATIVE   Ketones, ur NEGATIVE NEGATIVE mg/dL   Protein, ur NEGATIVE NEGATIVE mg/dL   Nitrite NEGATIVE NEGATIVE   Leukocytes, UA NEGATIVE NEGATIVE   RBC / HPF 0-5 0 - 5 RBC/hpf   WBC, UA 0-5 0 - 5 WBC/hpf   Bacteria, UA RARE (A) NONE SEEN   Squamous Epithelial / LPF NONE SEEN NONE SEEN   Mucus PRESENT   Ethanol/ETOH  Result Value Ref Range   Alcohol, Ethyl (B) <10 <10 mg/dL  Comprehensive metabolic panel  Result Value Ref Range   Sodium 130 (L) 135 - 145 mmol/L   Potassium 3.3 (L) 3.5 - 5.1 mmol/L   Chloride 86 (L) 101 - 111 mmol/L   CO2 26 22 - 32 mmol/L   Glucose, Bld 137 (H) 65 - 99 mg/dL   BUN <5 (L) 6 - 20 mg/dL   Creatinine, Ser 0.54 (L) 0.61 - 1.24 mg/dL   Calcium 8.3 (L) 8.9  - 10.3 mg/dL   Total Protein 7.0 6.5 - 8.1 g/dL   Albumin 3.8 3.5 - 5.0 g/dL   AST 104 (H) 15 - 41 U/L   ALT 27 17 - 63 U/L   Alkaline Phosphatase 108 38 - 126 U/L   Total Bilirubin 2.6 (H) 0.3 - 1.2 mg/dL   GFR calc non Af Amer >60 >60 mL/min   GFR calc Af Amer >60 >60 mL/min   Anion gap 18 (H) 5 - 15  CBG monitoring, ED  Result Value Ref Range   Glucose-Capillary 182 (H) 65 - 99 mg/dL   EKG  EKG Interpretation  Date/Time:  Monday November 28 2016 11:53:17 EST Ventricular Rate:  88 PR Interval:    QRS Duration: 94 QT Interval:  428 QTC Calculation: 518 R Axis:   14 Text Interpretation:  Sinus rhythm Minimal ST depression, anterolateral leads Prolonged QT interval , increased since last tracing Since last tracing rate slower Confirmed by Dorie Rank [phone]) on 11/28/2016 12:20:59 PM       Radiology Ct Head Wo Contrast  Result Date: 11/28/2016 CLINICAL DATA:  46 y/o male found down. Un-witnessed seizure. Postictal on EMS arrival. EXAM: CT HEAD WITHOUT CONTRAST TECHNIQUE: Contiguous axial images were obtained from the base of the skull through the vertex without intravenous contrast. COMPARISON:  Head CT 09/04/2012 and earlier. FINDINGS: Brain: Stable cerebral volume. No midline shift, ventriculomegaly, mass effect, evidence of mass lesion, intracranial hemorrhage or evidence of cortically based acute infarction. Gray-white matter differentiation is within normal limits throughout the brain. Vascular: Calcified atherosclerosis at the skull base. No suspicious intracranial vascular hyperdensity. Skull: Stable.  No skull fracture identified. Sinuses/Orbits: Chronic but increased partially visible right maxillary sinus opacification. Improved right sphenoid sinus aeration. Other visible paranasal sinuses and mastoids are stable and well pneumatized. Other: Left posterosuperior scalp hematoma or contusion measuring up to 8 mm in thickness. Otherwise negative Visualized orbits and scalp soft  tissues are within normal limits. IMPRESSION: 1. Left superior scalp soft tissue injury without underlying fracture. 2. No acute intracranial abnormality. Chronic brain volume  loss but stable and otherwise negative noncontrast CT appearance of the brain. Electronically Signed   By: Genevie Ann M.D.   On: 11/28/2016 13:27    Procedures Procedures (including critical care time)  Medications Ordered in ED Medications  potassium chloride 10 mEq in 100 mL IVPB (not administered)  potassium chloride SA (K-DUR,KLOR-CON) CR tablet 20 mEq (not administered)  magnesium sulfate IVPB 1 g 100 mL (not administered)  sodium chloride 0.9 % bolus 1,000 mL (1,000 mLs Intravenous New Bag/Given 11/28/16 1256)     Initial Impression / Assessment and Plan / ED Course  I have reviewed the triage vital signs and the nursing notes.  Pertinent labs & imaging results that were available during my care of the patient were reviewed by me and considered in my medical decision making (see chart for details).   Patient arrives via EMS status post unwitnessed loss of consciousness prior to arrival.  Per EMS patient was thought to have had a seizure, was not witnessed, patient does not recall the event.  Upon arrival to the ED patient is alert and oriented with tachycardia, otherwise stable vital signs.  Only complaint is his baseline dizziness described as being off balance.  During initial evaluation tried to ambulate patient he was very unsteady- patient relayed the ambulation caused dizziness to be worse than baseline.  Given unwitnessed event with head trauma in combination with unsteady gait CT scan was ordered-negative for any acute intracranial abnormality therefore doubt mass or bleed.  EKG ordered given patient with potential syncope and intermittent tachycardia-normal sinus rhythm, QTc 518-this is increased since last EKG, magnesium level was added.   CBC with macrocytic anemia, consistent with previous labs, slightly  worse, this is likely related to patient's alcohol abuse.  CMP revealed multiple electrolyte abnormalities including critical magnesium level of 0.9 and potassium of 3.3. Treating patient with PO and IV repletion as well as IV fluids.   15:40: Re-eval: patient feeling somewhat better, feels he would like to go home. Skin tear on LUE was cleansed with normal saline and non-adherent dressing was applied.   16:05: Discussed case with supervising physician Dr. Wilson Singer, recommended an additional 1g of Magnesium sulfate IV and 800mg  magnesium oxide PO prior to discharge home pending ambulation.   16:40: Patient receiving infusion at this time- discussed patient case including presentation and evaluation with Domenic Moras, PA-C. Discussed that once patient's infusion is complete- he will need repeat orthostatics and to be ambulated. If able to ambulate and repeat orthostatic vitals are improved he is able to be discharged home. Patient signed out at this time.    Final Clinical Impressions(s) / ED Diagnoses   Final diagnoses:  Hypomagnesemia  Hypokalemia  Macrocytic anemia    ED Discharge Orders    None       Amaryllis Dyke, PA-C 11/28/16 1647    Virgel Manifold, MD 11/29/16 1109

## 2016-11-28 NOTE — ED Notes (Addendum)
Per EDP and RN, writer was to ambulate patient after changing patient's clothing.  Pt is very shaky and unstable.  Pt unable to maintain balance while standing on side of bed to pull up and down pants.  Pt unable to stand on own.  Gait unsteady.  O2 ranges 85%-94% while standing.  Based on these findings, writer is unable to ambulate pt.  EDP and RN notified.

## 2016-11-28 NOTE — ED Provider Notes (Signed)
Received signout at the beginning of shift.  Patient with history of heavy alcohol use presenting today after having a seizure episode.  His labs remarkable for hypomagnesemia with a magnesium of 0.9.  EKG shows prolonged QT.  furthermore, he was orthostatic and having difficulty walking.  He was monitored throughout the ER visit but continues to be tremulous and showing signs of withdrawal.  He have received several boluses of IV fluid with minimal improvement.  Ativan given for his withdrawal symptoms.  Will initiate CIWA protocol and will have patient admitted for further management.  10:07 PM Appreciate consultation from [facility] Dr. Blaine Hamper who agrees to admit pt for further management of his alcohol withdrawal seizure.  Pt is aware and agrees with plan.    BP 135/88   Pulse (!) 107   Temp 98.7 F (37.1 C) (Oral)   Resp (!) 23   SpO2 90%   Results for orders placed or performed during the hospital encounter of 11/28/16  CBC WITH DIFFERENTIAL  Result Value Ref Range   WBC 7.0 4.0 - 10.5 K/uL   RBC 2.86 (L) 4.22 - 5.81 MIL/uL   Hemoglobin 11.4 (L) 13.0 - 17.0 g/dL   HCT 31.6 (L) 39.0 - 52.0 %   MCV 110.5 (H) 78.0 - 100.0 fL   MCH 39.9 (H) 26.0 - 34.0 pg   MCHC 36.1 (H) 30.0 - 36.0 g/dL   RDW 12.8 11.5 - 15.5 %   Platelets 130 (L) 150 - 400 K/uL   Neutrophils Relative % 84 %   Lymphocytes Relative 9 %   Monocytes Relative 7 %   Eosinophils Relative 0 %   Basophils Relative 0 %   Neutro Abs 5.9 1.7 - 7.7 K/uL   Lymphs Abs 0.6 (L) 0.7 - 4.0 K/uL   Monocytes Absolute 0.5 0.1 - 1.0 K/uL   Eosinophils Absolute 0.0 0.0 - 0.7 K/uL   Basophils Absolute 0.0 0.0 - 0.1 K/uL   Smear Review MORPHOLOGY UNREMARKABLE   Magnesium  Result Value Ref Range   Magnesium 0.9 (LL) 1.7 - 2.4 mg/dL  Urinalysis, Routine w reflex microscopic  Result Value Ref Range   Color, Urine YELLOW YELLOW   APPearance CLEAR CLEAR   Specific Gravity, Urine 1.008 1.005 - 1.030   pH 7.0 5.0 - 8.0   Glucose,  UA 50 (A) NEGATIVE mg/dL   Hgb urine dipstick SMALL (A) NEGATIVE   Bilirubin Urine NEGATIVE NEGATIVE   Ketones, ur NEGATIVE NEGATIVE mg/dL   Protein, ur NEGATIVE NEGATIVE mg/dL   Nitrite NEGATIVE NEGATIVE   Leukocytes, UA NEGATIVE NEGATIVE   RBC / HPF 0-5 0 - 5 RBC/hpf   WBC, UA 0-5 0 - 5 WBC/hpf   Bacteria, UA RARE (A) NONE SEEN   Squamous Epithelial / LPF NONE SEEN NONE SEEN   Mucus PRESENT   Ethanol/ETOH  Result Value Ref Range   Alcohol, Ethyl (B) <10 <10 mg/dL  Comprehensive metabolic panel  Result Value Ref Range   Sodium 130 (L) 135 - 145 mmol/L   Potassium 3.3 (L) 3.5 - 5.1 mmol/L   Chloride 86 (L) 101 - 111 mmol/L   CO2 26 22 - 32 mmol/L   Glucose, Bld 137 (H) 65 - 99 mg/dL   BUN <5 (L) 6 - 20 mg/dL   Creatinine, Ser 0.54 (L) 0.61 - 1.24 mg/dL   Calcium 8.3 (L) 8.9 - 10.3 mg/dL   Total Protein 7.0 6.5 - 8.1 g/dL   Albumin 3.8 3.5 - 5.0 g/dL  AST 104 (H) 15 - 41 U/L   ALT 27 17 - 63 U/L   Alkaline Phosphatase 108 38 - 126 U/L   Total Bilirubin 2.6 (H) 0.3 - 1.2 mg/dL   GFR calc non Af Amer >60 >60 mL/min   GFR calc Af Amer >60 >60 mL/min   Anion gap 18 (H) 5 - 15  CBG monitoring, ED  Result Value Ref Range   Glucose-Capillary 182 (H) 65 - 99 mg/dL   Ct Head Wo Contrast  Result Date: 11/28/2016 CLINICAL DATA:  46 y/o male found down. Un-witnessed seizure. Postictal on EMS arrival. EXAM: CT HEAD WITHOUT CONTRAST TECHNIQUE: Contiguous axial images were obtained from the base of the skull through the vertex without intravenous contrast. COMPARISON:  Head CT 09/04/2012 and earlier. FINDINGS: Brain: Stable cerebral volume. No midline shift, ventriculomegaly, mass effect, evidence of mass lesion, intracranial hemorrhage or evidence of cortically based acute infarction. Gray-white matter differentiation is within normal limits throughout the brain. Vascular: Calcified atherosclerosis at the skull base. No suspicious intracranial vascular hyperdensity. Skull: Stable.  No  skull fracture identified. Sinuses/Orbits: Chronic but increased partially visible right maxillary sinus opacification. Improved right sphenoid sinus aeration. Other visible paranasal sinuses and mastoids are stable and well pneumatized. Other: Left posterosuperior scalp hematoma or contusion measuring up to 8 mm in thickness. Otherwise negative Visualized orbits and scalp soft tissues are within normal limits. IMPRESSION: 1. Left superior scalp soft tissue injury without underlying fracture. 2. No acute intracranial abnormality. Chronic brain volume loss but stable and otherwise negative noncontrast CT appearance of the brain. Electronically Signed   By: Genevie Ann M.D.   On: 11/28/2016 13:27       Domenic Moras, PA-C 11/28/16 2207    Duffy Bruce, MD 11/29/16 0005

## 2016-11-29 ENCOUNTER — Other Ambulatory Visit: Payer: Self-pay

## 2016-11-29 LAB — CBC
HCT: 32.9 % — ABNORMAL LOW (ref 39.0–52.0)
HEMOGLOBIN: 11.8 g/dL — AB (ref 13.0–17.0)
MCH: 40.1 pg — ABNORMAL HIGH (ref 26.0–34.0)
MCHC: 35.9 g/dL (ref 30.0–36.0)
MCV: 111.9 fL — ABNORMAL HIGH (ref 78.0–100.0)
Platelets: 96 10*3/uL — ABNORMAL LOW (ref 150–400)
RBC: 2.94 MIL/uL — ABNORMAL LOW (ref 4.22–5.81)
RDW: 12.8 % (ref 11.5–15.5)
WBC: 11.6 10*3/uL — ABNORMAL HIGH (ref 4.0–10.5)

## 2016-11-29 LAB — HIV ANTIBODY (ROUTINE TESTING W REFLEX): HIV Screen 4th Generation wRfx: NONREACTIVE

## 2016-11-29 LAB — BRAIN NATRIURETIC PEPTIDE: B Natriuretic Peptide: 912.2 pg/mL — ABNORMAL HIGH (ref 0.0–100.0)

## 2016-11-29 LAB — COMPREHENSIVE METABOLIC PANEL
ALT: 23 U/L (ref 17–63)
AST: 92 U/L — ABNORMAL HIGH (ref 15–41)
Albumin: 3.7 g/dL (ref 3.5–5.0)
Alkaline Phosphatase: 93 U/L (ref 38–126)
Anion gap: 19 — ABNORMAL HIGH (ref 5–15)
BUN: <5 mg/dL — ABNORMAL LOW (ref 6–20)
CO2: 20 mmol/L — ABNORMAL LOW (ref 22–32)
Calcium: 7.8 mg/dL — ABNORMAL LOW (ref 8.9–10.3)
Chloride: 94 mmol/L — ABNORMAL LOW (ref 101–111)
Creatinine, Ser: 0.63 mg/dL (ref 0.61–1.24)
GFR calc Af Amer: >60 mL/min (ref >60)
GFR calc non Af Amer: >60 mL/min (ref >60)
Glucose, Bld: 128 mg/dL — ABNORMAL HIGH (ref 65–99)
Potassium: 2.7 mmol/L — CL (ref 3.5–5.1)
Sodium: 133 mmol/L — ABNORMAL LOW (ref 135–145)
Total Bilirubin: 2.3 mg/dL — ABNORMAL HIGH (ref 0.3–1.2)
Total Protein: 6.7 g/dL (ref 6.5–8.1)

## 2016-11-29 LAB — MAGNESIUM: Magnesium: 1.7 mg/dL (ref 1.7–2.4)

## 2016-11-29 MED ORDER — METOPROLOL TARTRATE 5 MG/5ML IV SOLN
5.0000 mg | INTRAVENOUS | Status: DC | PRN
  Administered 2016-11-29 (×2): 5 mg via INTRAVENOUS
  Filled 2016-11-29 (×2): qty 5

## 2016-11-29 MED ORDER — SODIUM CHLORIDE 0.9 % IV BOLUS (SEPSIS)
500.0000 mL | Freq: Once | INTRAVENOUS | Status: AC
  Administered 2016-11-29: 500 mL via INTRAVENOUS

## 2016-11-29 MED ORDER — POTASSIUM CHLORIDE 10 MEQ/100ML IV SOLN
10.0000 meq | INTRAVENOUS | Status: AC
  Administered 2016-11-29 (×3): 10 meq via INTRAVENOUS
  Filled 2016-11-29 (×3): qty 100

## 2016-11-29 MED ORDER — MAGNESIUM SULFATE IN D5W 1-5 GM/100ML-% IV SOLN
1.0000 g | Freq: Once | INTRAVENOUS | Status: AC
  Administered 2016-11-29: 1 g via INTRAVENOUS
  Filled 2016-11-29: qty 100

## 2016-11-29 MED ORDER — INFLUENZA VAC SPLIT QUAD 0.5 ML IM SUSY
0.5000 mL | PREFILLED_SYRINGE | INTRAMUSCULAR | Status: AC
  Administered 2016-12-02: 0.5 mL via INTRAMUSCULAR
  Filled 2016-11-29: qty 0.5

## 2016-11-29 MED ORDER — METOPROLOL TARTRATE 25 MG PO TABS
25.0000 mg | ORAL_TABLET | Freq: Two times a day (BID) | ORAL | Status: DC
  Administered 2016-11-29 – 2016-12-01 (×4): 25 mg via ORAL
  Filled 2016-11-29 (×6): qty 1

## 2016-11-29 MED ORDER — HEPARIN SODIUM (PORCINE) 5000 UNIT/ML IJ SOLN
5000.0000 [IU] | Freq: Three times a day (TID) | INTRAMUSCULAR | Status: DC
  Administered 2016-11-29 – 2016-12-03 (×12): 5000 [IU] via SUBCUTANEOUS
  Filled 2016-11-29 (×12): qty 1

## 2016-11-29 MED ORDER — LORAZEPAM 2 MG/ML IJ SOLN
1.0000 mg | Freq: Once | INTRAMUSCULAR | Status: AC
  Administered 2016-11-29: 1 mg via INTRAVENOUS

## 2016-11-29 MED ORDER — CHLORDIAZEPOXIDE HCL 5 MG PO CAPS
15.0000 mg | ORAL_CAPSULE | Freq: Three times a day (TID) | ORAL | Status: DC
  Administered 2016-11-29 – 2016-12-01 (×9): 15 mg via ORAL
  Filled 2016-11-29 (×9): qty 3

## 2016-11-29 MED ORDER — POTASSIUM CHLORIDE CRYS ER 20 MEQ PO TBCR
40.0000 meq | EXTENDED_RELEASE_TABLET | Freq: Four times a day (QID) | ORAL | Status: AC
  Administered 2016-11-29: 40 meq via ORAL
  Filled 2016-11-29: qty 2

## 2016-11-29 MED ORDER — ORAL CARE MOUTH RINSE
15.0000 mL | Freq: Two times a day (BID) | OROMUCOSAL | Status: DC
  Administered 2016-11-29 – 2016-12-02 (×7): 15 mL via OROMUCOSAL

## 2016-11-29 MED ORDER — KCL IN DEXTROSE-NACL 40-5-0.45 MEQ/L-%-% IV SOLN
INTRAVENOUS | Status: AC
  Administered 2016-11-29 – 2016-11-30 (×2): via INTRAVENOUS
  Filled 2016-11-29 (×4): qty 1000

## 2016-11-29 MED ORDER — MAGNESIUM OXIDE 400 (241.3 MG) MG PO TABS
400.0000 mg | ORAL_TABLET | Freq: Every day | ORAL | Status: DC
  Administered 2016-11-30 – 2016-12-03 (×4): 400 mg via ORAL
  Filled 2016-11-29 (×4): qty 1

## 2016-11-29 MED ORDER — POTASSIUM CHLORIDE CRYS ER 10 MEQ PO TBCR
30.0000 meq | EXTENDED_RELEASE_TABLET | Freq: Once | ORAL | Status: AC
  Administered 2016-11-29: 03:00:00 30 meq via ORAL
  Filled 2016-11-29: qty 1

## 2016-11-29 NOTE — Progress Notes (Addendum)
Rapid Response Event Note  Overview:  RRT called to room 1404, for change in pt mentation after receiving 4mg  IV Ativan.    Initial Focused Assessment: Pt awake, generalized body tremors. Pt has nystagmus eye movement, following some commands. Pupils dilated, PERRLA. Lung sounds are clear. HR ST, BP stable (see vital signs documented in flowsheet).  According to pt RN body tremors are unchanged since admission, VS similar to that upon admission. Pt has had a change in mentation, was AOx4 upon admission. Nystagmus eye movement is new onset.   Interventions: -4L [place] applied -1mg  Ativan given in addition to the 4mg  of Ativan pt had recently been given- questioning if pt was having a seizure, per NP Ativan given -NP called to bedside to confirm if tremors, head movement, and eye movements were seizure related or withdrawal symptoms. NP determined pt symptoms were that of withdrawal.   Plan of Care (if not transferred): RN made aware of signs and symptoms of pt having a seizure. RN to consult RRT if pt begins to show signs of declined, or consult RRT for further concerns. Pt ordered to be NPO to reduce risk for aspiration.  Side rails padded, suction set-up at bedside, oxygen set-up at bedside.   Event Summary:  Eugene Jackson

## 2016-11-29 NOTE — Progress Notes (Signed)
CRITICAL VALUE ALERT  Critical Value:  K 2.7  Date & Time Notied:  11/29/16 0210  Provider Notified: Baltazar Najjar  Orders Received/Actions taken: 3 runs IV K, 30 mEq PO K ordered and administered

## 2016-11-29 NOTE — ED Notes (Signed)
When entering room to check on patient, he was attempting to get back in the bed after defecating on the floor. Pt was assisted back to bed. Call bell was within reach and pt said that he was unsure why he didn't use it. He requested to go to the restroom and was taken in a wheelchair. Pt was also changed and feces cleaned off of his leg and arm. Upon return to the room, he was reoriented to the call bell and how to use it. Pt verbalized understanding on how to use call bell. Door left open for closer monitoring.

## 2016-11-29 NOTE — ED Notes (Signed)
ED TO INPATIENT HANDOFF REPORT  Name/Age/Gender Eugene Jackson 46 y.o. male  Code Status    Code Status Orders  (From admission, onward)        Start     Ordered   11/28/16 2224  Full code  Continuous     11/28/16 2224    Code Status History    Date Active Date Inactive Code Status Order ID Comments User Context   10/10/2012 20:56 10/22/2012 16:11 Full Code [id number]  Theodis Blaze, MD Inpatient   09/03/2012 20:12 09/07/2012 15:25 Full Code [id number]  Elsie Stain, MD Inpatient      Home/SNF/Other Home  Chief Complaint Seizure  Level of Care/Admitting Diagnosis ED Disposition    ED Disposition Condition [place] [facility] Area: [facility] [[id number]]  Level of Care: Telemetry [5]  Admit to tele based on following criteria: Other see comments  Comments: Tachycardia  Diagnosis: Alcohol withdrawal seizure [facility]) [[id number]]  Admitting Physician: Ivor Costa [[id number]]  Attending Physician: Ivor Costa [phone]  Estimated length of stay: past midnight tomorrow  Certification:: I certify this patient will need inpatient services for at least 2 midnights  PT Class (Do Not Modify): Inpatient [101]  PT Acc Code (Do Not Modify): Private [1]       Medical History Past Medical History:  Diagnosis Date  . Broken femur ([place])    right  . Hypertension   . PONV (postoperative nausea and vomiting)     Allergies No Known Allergies  IV Location/Drains/Wounds Patient Lines/Drains/Airways Status   Active Line/Drains/Airways    Name:   Placement date:   Placement time:   Site:   Days:   Peripheral IV 11/28/16 Right Antecubital   11/28/16    1149    Antecubital   1          Labs/Imaging Results for orders placed or performed during the hospital encounter of 11/28/16 (from the past 48 hour(s))  CBG monitoring, ED     Status: Abnormal   Collection Time: 11/28/16 11:55 AM  Result Value Ref Range   Glucose-Capillary 182 (H) 65 - 99 mg/dL  CBC WITH  DIFFERENTIAL     Status: Abnormal   Collection Time: 11/28/16 12:30 PM  Result Value Ref Range   WBC 7.0 4.0 - 10.5 K/uL   RBC 2.86 (L) 4.22 - 5.81 MIL/uL   Hemoglobin 11.4 (L) 13.0 - 17.0 g/dL   HCT 31.6 (L) 39.0 - 52.0 %   MCV 110.5 (H) 78.0 - 100.0 fL   MCH 39.9 (H) 26.0 - 34.0 pg   MCHC 36.1 (H) 30.0 - 36.0 g/dL   RDW 12.8 11.5 - 15.5 %   Platelets 130 (L) 150 - 400 K/uL   Neutrophils Relative % 84 %   Lymphocytes Relative 9 %   Monocytes Relative 7 %   Eosinophils Relative 0 %   Basophils Relative 0 %   Neutro Abs 5.9 1.7 - 7.7 K/uL   Lymphs Abs 0.6 (L) 0.7 - 4.0 K/uL   Monocytes Absolute 0.5 0.1 - 1.0 K/uL   Eosinophils Absolute 0.0 0.0 - 0.7 K/uL   Basophils Absolute 0.0 0.0 - 0.1 K/uL   Smear Review MORPHOLOGY UNREMARKABLE   Magnesium     Status: Abnormal   Collection Time: 11/28/16 12:30 PM  Result Value Ref Range   Magnesium 0.9 (LL) 1.7 - 2.4 mg/dL    Comment: CRITICAL RESULT CALLED TO, READ BACK BY AND VERIFIED WITH: A.SCHULMAN RN [id number]  [id number] A.QUIZON   Ethanol/ETOH     Status: None   Collection Time: 11/28/16 12:30 PM  Result Value Ref Range   Alcohol, Ethyl (B) <10 <10 mg/dL    Comment:        LOWEST DETECTABLE LIMIT FOR SERUM ALCOHOL IS 10 mg/dL FOR MEDICAL PURPOSES ONLY   Comprehensive metabolic panel     Status: Abnormal   Collection Time: 11/28/16 12:30 PM  Result Value Ref Range   Sodium 130 (L) 135 - 145 mmol/L   Potassium 3.3 (L) 3.5 - 5.1 mmol/L   Chloride 86 (L) 101 - 111 mmol/L   CO2 26 22 - 32 mmol/L   Glucose, Bld 137 (H) 65 - 99 mg/dL   BUN <5 (L) 6 - 20 mg/dL   Creatinine, Ser 0.54 (L) 0.61 - 1.24 mg/dL   Calcium 8.3 (L) 8.9 - 10.3 mg/dL   Total Protein 7.0 6.5 - 8.1 g/dL   Albumin 3.8 3.5 - 5.0 g/dL   AST 104 (H) 15 - 41 U/L   ALT 27 17 - 63 U/L   Alkaline Phosphatase 108 38 - 126 U/L   Total Bilirubin 2.6 (H) 0.3 - 1.2 mg/dL   GFR calc non Af Amer >60 >60 mL/min   GFR calc Af Amer >60 >60 mL/min    Comment: (NOTE) The eGFR has  been calculated using the CKD EPI equation. This calculation has not been validated in all clinical situations. eGFR's persistently <60 mL/min signify possible Chronic Kidney Disease.    Anion gap 18 (H) 5 - 15  Urinalysis, Routine w reflex microscopic     Status: Abnormal   Collection Time: 11/28/16 12:56 PM  Result Value Ref Range   Color, Urine YELLOW YELLOW   APPearance CLEAR CLEAR   Specific Gravity, Urine 1.008 1.005 - 1.030   pH 7.0 5.0 - 8.0   Glucose, UA 50 (A) NEGATIVE mg/dL   Hgb urine dipstick SMALL (A) NEGATIVE   Bilirubin Urine NEGATIVE NEGATIVE   Ketones, ur NEGATIVE NEGATIVE mg/dL   Protein, ur NEGATIVE NEGATIVE mg/dL   Nitrite NEGATIVE NEGATIVE   Leukocytes, UA NEGATIVE NEGATIVE   RBC / HPF 0-5 0 - 5 RBC/hpf   WBC, UA 0-5 0 - 5 WBC/hpf   Bacteria, UA RARE (A) NONE SEEN   Squamous Epithelial / LPF NONE SEEN NONE SEEN   Mucus PRESENT    Ct Head Wo Contrast  Result Date: 11/28/2016 CLINICAL DATA:  46 y/o male found down. Un-witnessed seizure. Postictal on EMS arrival. EXAM: CT HEAD WITHOUT CONTRAST TECHNIQUE: Contiguous axial images were obtained from the base of the skull through the vertex without intravenous contrast. COMPARISON:  Head CT 09/04/2012 and earlier. FINDINGS: Brain: Stable cerebral volume. No midline shift, ventriculomegaly, mass effect, evidence of mass lesion, intracranial hemorrhage or evidence of cortically based acute infarction. Gray-white matter differentiation is within normal limits throughout the brain. Vascular: Calcified atherosclerosis at the skull base. No suspicious intracranial vascular hyperdensity. Skull: Stable.  No skull fracture identified. Sinuses/Orbits: Chronic but increased partially visible right maxillary sinus opacification. Improved right sphenoid sinus aeration. Other visible paranasal sinuses and mastoids are stable and well pneumatized. Other: Left posterosuperior scalp hematoma or contusion measuring up to 8 mm in thickness.  Otherwise negative Visualized orbits and scalp soft tissues are within normal limits. IMPRESSION: 1. Left superior scalp soft tissue injury without underlying fracture. 2. No acute intracranial abnormality. Chronic brain volume loss but stable and otherwise negative noncontrast CT appearance of the brain. Electronically  Signed   By: Genevie Ann M.D.   On: 11/28/2016 13:27   Dg Chest Port 1 View  Result Date: 11/28/2016 CLINICAL DATA:  Seizure and cough EXAM: PORTABLE CHEST 1 VIEW COMPARISON:  11/26/2012 FINDINGS: Mild left pleural and parenchymal scarring at the left lung base. Right lung is clear. No acute consolidation. Normal cardiomediastinal silhouette with atherosclerosis. No pneumothorax. IMPRESSION: Left basilar pleural and parenchymal scarring. No acute infiltrate or edema. Electronically Signed   By: Donavan Foil M.D.   On: 11/28/2016 22:51    Pending Labs Unresulted Labs (From admission, onward)   Start     Ordered   11/29/16 0500  Brain natriuretic peptide  Tomorrow morning,   R     11/28/16 2209   11/29/16 0500  Comprehensive metabolic panel  Tomorrow morning,   R     11/28/16 2224   11/29/16 0500  CBC  Tomorrow morning,   R     11/28/16 2224   11/28/16 2223  HIV antibody (Routine Testing)  Once,   R     11/28/16 2224   11/28/16 2222  Hepatitis panel, acute  Once,   R     11/28/16 2221      Vitals/Pain Today's Vitals   11/28/16 1900 11/28/16 1915 11/28/16 1930 11/28/16 2352  BP: (!) 140/100  135/88 (!) 132/99  Pulse: 97 (!) 101 (!) 107 (!) 132  Resp: (!) 21 14 (!) 23 20  Temp:      TempSrc:      SpO2: 96% 96% 90% 99%  PainSc:        Isolation Precautions No active isolations  Medications Medications  folic acid (FOLVITE) tablet 1 mg (1 mg Oral Given 11/28/16 2157)  cloNIDine (CATAPRES) tablet 0.1 mg (0.1 mg Oral Given 11/28/16 2355)  chlordiazePOXIDE (LIBRIUM) capsule 5 mg (5 mg Oral Given 11/28/16 2354)  magnesium sulfate IVPB 1 g 100 mL (1 g Intravenous Not  Given 11/28/16 2259)  LORazepam (ATIVAN) injection 1 mg (not administered)  0.9 %  sodium chloride infusion ( Intravenous New Bag/Given 11/29/16 0017)  nicotine (NICODERM CQ - dosed in mg/24 hours) patch 21 mg (not administered)  LORazepam (ATIVAN) tablet 1 mg (not administered)    Or  LORazepam (ATIVAN) injection 1 mg (not administered)  thiamine (VITAMIN B-1) tablet 100 mg (not administered)    Or  thiamine (B-1) injection 100 mg (not administered)  folic acid (FOLVITE) tablet 1 mg (not administered)  multivitamin with minerals tablet 1 tablet (not administered)  LORazepam (ATIVAN) injection 0-4 mg (2 mg Intravenous Given 11/29/16 0016)    Followed by  LORazepam (ATIVAN) injection 0-4 mg (not administered)  hydrOXYzine (ATARAX/VISTARIL) tablet 10 mg (not administered)  zolpidem (AMBIEN) tablet 5 mg (not administered)  hydrALAZINE (APRESOLINE) injection 5 mg (not administered)  dextromethorphan-guaiFENesin (MUCINEX DM) 30-600 MG per 12 hr tablet 1 tablet (not administered)  sodium chloride 0.9 % bolus 1,000 mL (0 mLs Intravenous Stopped 11/28/16 1729)  potassium chloride 10 mEq in 100 mL IVPB (0 mEq Intravenous Stopped 11/28/16 1729)  potassium chloride SA (K-DUR,KLOR-CON) CR tablet 20 mEq (20 mEq Oral Given 11/28/16 1414)  magnesium sulfate IVPB 1 g 100 mL (0 g Intravenous Stopped 11/28/16 1534)  sodium chloride 0.9 % bolus 1,000 mL (0 mLs Intravenous Stopped 11/28/16 1857)  magnesium oxide (MAG-OX) tablet 800 mg (800 mg Oral Given 11/28/16 1729)  magnesium sulfate IVPB 1 g 100 mL (0 g Intravenous Stopped 11/28/16 2043)  LORazepam (ATIVAN) injection 1 mg (1 mg  Intravenous Given 11/28/16 2044)    Mobility Walks normally, needs assist at this time

## 2016-11-29 NOTE — Plan of Care (Signed)
  Progressing Education: Knowledge of General Education information will improve 11/29/2016 0204 - Progressing by Talbert Forest, RN Clinical Measurements: Respiratory complications will improve 11/29/2016 0204 - Progressing by Talbert Forest, RN Cardiovascular complication will be avoided 11/29/2016 0204 - Progressing by Talbert Forest, RN Elimination: Will not experience complications related to urinary retention 11/29/2016 0204 - Progressing by Talbert Forest, RN Pain Managment: General experience of comfort will improve 11/29/2016 0204 - Progressing by Talbert Forest, RN Safety: Ability to remain free from injury will improve 11/29/2016 0204 - Progressing by Talbert Forest, RN Physical Regulation: Complications related to the disease process, condition or treatment will be avoided or minimized 11/29/2016 0204 - Progressing by Talbert Forest, RN Safety: Ability to remain free from injury will improve 11/29/2016 0204 - Progressing by Talbert Forest, RN

## 2016-11-29 NOTE — Consult Note (Signed)
[place] Nurse wound consult note Reason for Consult: Reported right elbow abrasion and left forearm laceration.  None found on examination by night shift RN, day shift RN, NT or this Probation officer. Patient's head is examined through hair and no laceration or contusion is seen. Skin care to be performed per house routine during bathing using skin cleanser, moisturizer and as needed, moisture barrier.  An external male catheter is in place. Patient is unable to assist with assessment.  [place] nursing team will not follow, but will remain available to this patient, the nursing and medical teams.  Please re-consult if needed. Thanks, Maudie Flakes, MSN, RN, [place], Arther Abbott  Pager# [phone]

## 2016-11-29 NOTE — Progress Notes (Signed)
RN, Wilburn Cornelia, called because she couldn't tell if pt was having a seizure or just withdrawal sx. Pt is tremulous with head and extremities, but is responsive, although lethargic. Just received ordered Ativan. Ativan 1mg  IV additional given without relief of sx. NP to bedside. S: pt can not participate in ROS due to mental status.  O: Disheveled appearing WM in mild distress. BP 107. HR 120s. RR 18 with normal effort. O2 sat is normal. He opens his eyes spontaneously and mumbles. He can tell NP his name. Speech is garbled, but primary RN says this has been true all night. He does not follow commands. He has calm moments alternating with tremulous activity of extremities and head. Head is straight. PERRL. 80mm, round.  A/P: 1. This is a 46 yo WM admitted yesterday after been found down due to likely ETOH withdrawal seizure. Has had in the past. Continue Ativan as ordered. NP believes this NOT to be active seizure activity and just sx of ETOH withdrawal. CT on admit without acute defect. Elevate HOB in case of vomiting. Siderail pads on and sz precautions. Will follow.   Total critical care time: 25 minutes Critical care time was exclusive of separately billable procedures and treating other patients. Critical care was necessary to treat or prevent imminent or life-threatening deterioration. Critical care was time spent personally by me on the following activities: development of treatment plan with patient and/or surrogate as well as nursing, discussions with consultants, evaluation of patient's response to treatment, examination of patient, obtaining history from patient or surrogate, ordering and performing treatments and interventions, ordering and review of laboratory studies, ordering and review of radiographic studies, pulse oximetry and re-evaluation of patient's condition. KJKG, NP [facility]

## 2016-11-29 NOTE — Progress Notes (Signed)
@IPLOG @        PROGRESS NOTE                                                                                                                                                                                                             Patient Demographics:    Eugene Jackson, is a 46 y.o. male, DOB - 1970/09/30, JTT:[id number]  Admit date - 11/28/2016   Admitting Physician Ivor Costa, MD  Outpatient Primary MD for the patient is [place], Marshall Cork., MD  LOS - 1  Chief Complaint  Patient presents with  . Seizures       Brief Narrative  Eugene Jackson is a 46 y.o. male with medical history significant of alcohol abuse, alcohol withdrawal seizure, tobacco abuse, hypertension, who presents with seizure. Per his daughter, pt had an unwitnessed seizure and found on the ground at about 10:20 AM, was admitted for Alcohol withdrawal seizures.   Subjective:    Eugene Jackson today is delirious but able to answer basic questions, No headache, No chest pain, No abdominal pain - No Nausea, No new weakness tingling or numbness, No Cough - SOB.     Assessment  & Plan :    Alcohol withdrawal seizure ([place]): Tells me he drinks " A lot", last drin Friday, in DTs, pleased on Librium + CIWA, head CT nehative, no headache on FN deficits, start IVF including D5, NPO except meds, monitor closely. Wound care consult for skin laceration.  Essential hypertension: Blood pressure 158/92. Patient not taking the medications at home.  Start clonidine 0.1 mg 3 times a day for hypertension  + PRN Lopressor and Hydralazine.  Tobacco abuse and Alcohol abuse: counseled, Patch.   Hypokalemia and hypomagnesemia:  -replated both and recheck.  thrombocytopenia ([place]): platelet 130. No bleeding tendency. Most likely due to alcohol abuse. Monitor.  Abnormal liver function tests: AST 104, ALT 27, normal ALP. Total BR is 2.6. Most likely due to alcohol abuse. Pending HIV ab and hepatitis panel, trend stable, this is  Alcohol related.    Diet : Diet NPO time specified Except for: Sips with Meds, Ice Chips    Family Communication  :  none  Code Status :  Full  Disposition Plan  :  TBD   Consults  :  None  Procedures  :  Head CT - negative  DVT Prophylaxis  :  Heparin   Lab Results  Component Value Date   PLT 96 (L) 11/29/2016    Inpatient Medications  Scheduled Meds: .  chlordiazePOXIDE  15 mg Oral TID  . cloNIDine  0.1 mg Oral TID  . folic acid  1 mg Oral Daily  . [START ON 11/30/2016] Influenza vac split quadrivalent PF  0.5 mL Intramuscular Tomorrow-1000  . LORazepam  0-4 mg Intravenous Q6H   Followed by  . [START ON 11/30/2016] LORazepam  0-4 mg Intravenous Q12H  . magnesium oxide  400 mg Oral Daily  . mouth rinse  15 mL Mouth Rinse BID  . multivitamin with minerals  1 tablet Oral Daily  . nicotine  21 mg Transdermal Daily  . potassium chloride  40 mEq Oral Q6H  . thiamine  100 mg Oral Daily   Or  . thiamine  100 mg Intravenous Daily   Continuous Infusions: . dextrose 5 % and 0.45 % NaCl with KCl 40 mEq/L 50 mL/hr at 11/29/16 0915  . sodium chloride     PRN Meds:.dextromethorphan-guaiFENesin, hydrALAZINE, hydrOXYzine, LORazepam, LORazepam **OR** LORazepam, zolpidem  Antibiotics  :    Anti-infectives (From admission, onward)   None         Objective:   Vitals:   11/29/16 0415 11/29/16 0425 11/29/16 0556 11/29/16 1100  BP:  107/82 94/71 114/85  Pulse:   (!) 117 (!) 125  Resp: (!) 21 (!) 23 (!) 24   Temp:   98.5 F (36.9 C)   TempSrc:   Axillary   SpO2: 98% 95% 100%   Weight:      Height:        Wt Readings from Last 3 Encounters:  11/29/16 65.1 kg (143 lb 8.3 oz)  11/26/12 70.9 kg (156 lb 3.2 oz)  11/21/12 71 kg (156 lb 8 oz)     Intake/Output Summary (Last 24 hours) at 11/29/2016 1203 Last data filed at 11/29/2016 0600 Gross per 24 hour  Intake 1912.5 ml  Output 1125 ml  Net 787.5 ml     Physical Exam  Delirious but follows basic commands,  moves all 4 extremities to command [place].AT,PERRAL Supple Neck,No JVD, No cervical lymphadenopathy appriciated.  Symmetrical Chest wall movement, Good air movement bilaterally, CTAB RRR,No Gallops,Rubs or new Murmurs, No Parasternal Heave +ve B.Sounds, Abd Soft, No tenderness, No organomegaly appriciated, No rebound - guarding or rigidity. No Cyanosis, Clubbing or edema, No new Rash or bruise    Data Review:    CBC Recent Labs  Lab 11/28/16 1230 11/29/16 0125  WBC 7.0 11.6*  HGB 11.4* 11.8*  HCT 31.6* 32.9*  PLT 130* 96*  MCV 110.5* 111.9*  MCH 39.9* 40.1*  MCHC 36.1* 35.9  RDW 12.8 12.8  LYMPHSABS 0.6*  --   MONOABS 0.5  --   EOSABS 0.0  --   BASOSABS 0.0  --     Chemistries  Recent Labs  Lab 11/28/16 1230 11/29/16 0125 11/29/16 0230  NA 130* 133*  --   K 3.3* 2.7*  --   CL 86* 94*  --   CO2 26 20*  --   GLUCOSE 137* 128*  --   BUN <5* <5*  --   CREATININE 0.54* 0.63  --   CALCIUM 8.3* 7.8*  --   MG 0.9*  --  1.7  AST 104* 92*  --   ALT 27 23  --   ALKPHOS 108 93  --   BILITOT 2.6* 2.3*  --    ------------------------------------------------------------------------------------------------------------------ No results for input(s): CHOL, HDL, LDLCALC, TRIG, CHOLHDL, LDLDIRECT in the last 72 hours.  No results found for: HGBA1C ------------------------------------------------------------------------------------------------------------------ No results for  input(s): TSH, T4TOTAL, T3FREE, THYROIDAB in the last 72 hours.  Invalid input(s): FREET3 ------------------------------------------------------------------------------------------------------------------ No results for input(s): VITAMINB12, FOLATE, FERRITIN, TIBC, IRON, RETICCTPCT in the last 72 hours.  Coagulation profile No results for input(s): INR, PROTIME in the last 168 hours.  No results for input(s): DDIMER in the last 72 hours.  Cardiac Enzymes No results for input(s): CKMB, TROPONINI,  MYOGLOBIN in the last 168 hours.  Invalid input(s): CK ------------------------------------------------------------------------------------------------------------------    Component Value Date/Time   BNP 912.2 (H) 11/29/2016 0125    Micro Results  No results found for this or any previous visit (from the past 240 hour(s)).  Radiology Reports  Ct Head Wo Contrast  Result Date: 11/28/2016 CLINICAL DATA:  46 y/o male found down. Un-witnessed seizure. Postictal on EMS arrival. EXAM: CT HEAD WITHOUT CONTRAST TECHNIQUE: Contiguous axial images were obtained from the base of the skull through the vertex without intravenous contrast. COMPARISON:  Head CT 09/04/2012 and earlier. FINDINGS: Brain: Stable cerebral volume. No midline shift, ventriculomegaly, mass effect, evidence of mass lesion, intracranial hemorrhage or evidence of cortically based acute infarction. Gray-white matter differentiation is within normal limits throughout the brain. Vascular: Calcified atherosclerosis at the skull base. No suspicious intracranial vascular hyperdensity. Skull: Stable.  No skull fracture identified. Sinuses/Orbits: Chronic but increased partially visible right maxillary sinus opacification. Improved right sphenoid sinus aeration. Other visible paranasal sinuses and mastoids are stable and well pneumatized. Other: Left posterosuperior scalp hematoma or contusion measuring up to 8 mm in thickness. Otherwise negative Visualized orbits and scalp soft tissues are within normal limits. IMPRESSION: 1. Left superior scalp soft tissue injury without underlying fracture. 2. No acute intracranial abnormality. Chronic brain volume loss but stable and otherwise negative noncontrast CT appearance of the brain. Electronically Signed   By: Genevie Ann M.D.   On: 11/28/2016 13:27   Dg Chest Port 1 View  Result Date: 11/28/2016 CLINICAL DATA:  Seizure and cough EXAM: PORTABLE CHEST 1 VIEW COMPARISON:  11/26/2012 FINDINGS: Mild left  pleural and parenchymal scarring at the left lung base. Right lung is clear. No acute consolidation. Normal cardiomediastinal silhouette with atherosclerosis. No pneumothorax. IMPRESSION: Left basilar pleural and parenchymal scarring. No acute infiltrate or edema. Electronically Signed   By: Donavan Foil M.D.   On: 11/28/2016 22:51    Time Spent in minutes  30   Lala Lund M.D on 11/29/2016 at 12:03 PM  Between 7am to 7pm - Pager - [phone] ( page via [email or web address], text pages only, please mention full 10 digit call back number). After 7pm go to [email or web address] - password [facility]

## 2016-11-30 ENCOUNTER — Inpatient Hospital Stay (HOSPITAL_COMMUNITY): Payer: PRIVATE HEALTH INSURANCE

## 2016-11-30 DIAGNOSIS — R509 Fever, unspecified: Secondary | ICD-10-CM

## 2016-11-30 DIAGNOSIS — J189 Pneumonia, unspecified organism: Secondary | ICD-10-CM | POA: Clinically undetermined

## 2016-11-30 DIAGNOSIS — N390 Urinary tract infection, site not specified: Secondary | ICD-10-CM | POA: Diagnosis present

## 2016-11-30 LAB — COMPREHENSIVE METABOLIC PANEL
ALK PHOS: 81 U/L (ref 38–126)
ALT: 23 U/L (ref 17–63)
AST: 98 U/L — AB (ref 15–41)
Albumin: 3.5 g/dL (ref 3.5–5.0)
Anion gap: 10 (ref 5–15)
BILIRUBIN TOTAL: 2.4 mg/dL — AB (ref 0.3–1.2)
BUN: 8 mg/dL (ref 6–20)
CO2: 22 mmol/L (ref 22–32)
CREATININE: 0.75 mg/dL (ref 0.61–1.24)
Calcium: 7.8 mg/dL — ABNORMAL LOW (ref 8.9–10.3)
Chloride: 108 mmol/L (ref 101–111)
GFR calc non Af Amer: >60 mL/min (ref >60)
Glucose, Bld: 124 mg/dL — ABNORMAL HIGH (ref 65–99)
Potassium: 3.2 mmol/L — ABNORMAL LOW (ref 3.5–5.1)
SODIUM: 140 mmol/L (ref 135–145)
Total Protein: 6.2 g/dL — ABNORMAL LOW (ref 6.5–8.1)

## 2016-11-30 LAB — URINALYSIS, ROUTINE W REFLEX MICROSCOPIC
Glucose, UA: NEGATIVE mg/dL
KETONES UR: 5 mg/dL — AB
Leukocytes, UA: NEGATIVE
Nitrite: POSITIVE — AB
Protein, ur: >=300 mg/dL — AB
Specific Gravity, Urine: 1.02 (ref 1.005–1.030)
pH: 7 (ref 5.0–8.0)

## 2016-11-30 LAB — CBC WITH DIFFERENTIAL/PLATELET
BASOS PCT: 0 %
Basophils Absolute: 0 10*3/uL (ref 0.0–0.1)
EOS PCT: 0 %
Eosinophils Absolute: 0 10*3/uL (ref 0.0–0.7)
HEMATOCRIT: 30.9 % — AB (ref 39.0–52.0)
HEMOGLOBIN: 10.6 g/dL — AB (ref 13.0–17.0)
LYMPHS PCT: 11 %
Lymphs Abs: 1 10*3/uL (ref 0.7–4.0)
MCH: 39.6 pg — AB (ref 26.0–34.0)
MCHC: 34.3 g/dL (ref 30.0–36.0)
MCV: 115.3 fL — AB (ref 78.0–100.0)
Monocytes Absolute: 0.5 10*3/uL (ref 0.1–1.0)
Monocytes Relative: 5 %
NEUTROS PCT: 84 %
Neutro Abs: 8 10*3/uL — ABNORMAL HIGH (ref 1.7–7.7)
Platelets: 82 10*3/uL — ABNORMAL LOW (ref 150–400)
RBC: 2.68 MIL/uL — ABNORMAL LOW (ref 4.22–5.81)
RDW: 12.9 % (ref 11.5–15.5)
WBC: 9.5 10*3/uL (ref 4.0–10.5)

## 2016-11-30 LAB — LACTIC ACID, PLASMA
Lactic Acid, Venous: 2.5 mmol/L (ref 0.5–1.9)
Lactic Acid, Venous: 2.3 mmol/L (ref 0.5–1.9)

## 2016-11-30 LAB — HEPATITIS PANEL, ACUTE
HEP A IGM: NEGATIVE
HEP B S AG: NEGATIVE
Hep B C IgM: NEGATIVE

## 2016-11-30 LAB — INFLUENZA PANEL BY PCR (TYPE A & B)
Influenza A By PCR: NEGATIVE
Influenza B By PCR: NEGATIVE

## 2016-11-30 LAB — MAGNESIUM: MAGNESIUM: 1.6 mg/dL — AB (ref 1.7–2.4)

## 2016-11-30 LAB — STREP PNEUMONIAE URINARY ANTIGEN: Strep Pneumo Urinary Antigen: NEGATIVE

## 2016-11-30 LAB — GLUCOSE, CAPILLARY: GLUCOSE-CAPILLARY: 106 mg/dL — AB (ref 65–99)

## 2016-11-30 MED ORDER — SODIUM CHLORIDE 0.9 % IV BOLUS (SEPSIS)
500.0000 mL | Freq: Once | INTRAVENOUS | Status: AC
  Administered 2016-11-30: 500 mL via INTRAVENOUS

## 2016-11-30 MED ORDER — ACETAMINOPHEN 325 MG PO TABS
650.0000 mg | ORAL_TABLET | Freq: Once | ORAL | Status: AC
  Administered 2016-11-30: 650 mg via ORAL
  Filled 2016-11-30: qty 2

## 2016-11-30 MED ORDER — METOPROLOL TARTRATE 5 MG/5ML IV SOLN: 5.0000 mg | INTRAVENOUS | Status: DC | PRN

## 2016-11-30 MED ORDER — DEXTROSE 5 % IV SOLN
1.0000 g | INTRAVENOUS | Status: DC
  Administered 2016-11-30: 1 g via INTRAVENOUS
  Filled 2016-11-30: qty 10

## 2016-11-30 MED ORDER — VITAMINS A & D EX OINT
TOPICAL_OINTMENT | CUTANEOUS | Status: AC
  Administered 2016-11-30: 10:00:00
  Filled 2016-11-30: qty 5

## 2016-11-30 MED ORDER — LORAZEPAM 1 MG PO TABS
2.0000 mg | ORAL_TABLET | Freq: Once | ORAL | Status: AC
  Administered 2016-11-30: 2 mg via SUBLINGUAL
  Filled 2016-11-30: qty 2

## 2016-11-30 MED ORDER — MAGNESIUM SULFATE IN D5W 1-5 GM/100ML-% IV SOLN
1.0000 g | Freq: Once | INTRAVENOUS | Status: AC
  Administered 2016-11-30: 1 g via INTRAVENOUS
  Filled 2016-11-30: qty 100

## 2016-11-30 MED ORDER — MAGNESIUM SULFATE 4 GM/100ML IV SOLN
4.0000 g | Freq: Once | INTRAVENOUS | Status: DC
  Filled 2016-11-30: qty 100

## 2016-11-30 MED ORDER — SODIUM CHLORIDE 0.9 % IV SOLN
3.0000 g | Freq: Three times a day (TID) | INTRAVENOUS | Status: DC
  Administered 2016-11-30 – 2016-12-02 (×6): 3 g via INTRAVENOUS
  Filled 2016-11-30 (×7): qty 3

## 2016-11-30 MED ORDER — POTASSIUM CHLORIDE 10 MEQ/100ML IV SOLN
10.0000 meq | INTRAVENOUS | Status: AC
  Administered 2016-11-30 (×2): 10 meq via INTRAVENOUS
  Filled 2016-11-30 (×2): qty 100

## 2016-11-30 MED ORDER — POTASSIUM CHLORIDE CRYS ER 20 MEQ PO TBCR
40.0000 meq | EXTENDED_RELEASE_TABLET | ORAL | Status: AC
  Administered 2016-11-30: 40 meq via ORAL
  Filled 2016-11-30: qty 2

## 2016-11-30 MED ORDER — MAGNESIUM SULFATE 50 % IJ SOLN
3.0000 g | Freq: Once | INTRAVENOUS | Status: AC
  Administered 2016-11-30: 3 g via INTRAVENOUS
  Filled 2016-11-30: qty 6

## 2016-11-30 MED ORDER — DEXTROSE 5 % IV SOLN
500.0000 mg | INTRAVENOUS | Status: DC
  Filled 2016-11-30: qty 500

## 2016-11-30 NOTE — Progress Notes (Signed)
CRITICAL VALUE ALERT  Critical Value:  Lactic acid 2.5   Date & Time Notied:  11/30/16 at 5/30  Provider Notified: K.Kirby-NP  Orders Received/Actions taken: Yes, bolus and more.

## 2016-11-30 NOTE — Progress Notes (Signed)
Patient with elevated temp 101-102. Notified [name]. Orders given for tylenol, CXR, UA, lactic acid and blood culture. Will continue to assess patient,

## 2016-11-30 NOTE — Evaluation (Signed)
Clinical/Bedside Swallow Evaluation Patient Details  Name: Eugene Jackson MRN: [id number] Date of Birth: [birth year]  Today's Date: 11/30/2016 Time: SLP Start Time (ACUTE ONLY): 1250 SLP Stop Time (ACUTE ONLY): 1310 SLP Time Calculation (min) (ACUTE ONLY): 20 min  Past Medical History:  Past Medical History:  Diagnosis Date  . Broken femur ([place])    right  . Hypertension   . PONV (postoperative nausea and vomiting)    Past Surgical History:  Past Surgical History:  Procedure Laterality Date  . JOINT REPLACEMENT     bilateral hip replacement  . WRIST SURGERY     HPI:  46 yo male adm to [facility] with alcohol withdrawl induced seizure, PMH + for ETOH use/abuse, tremors.  Pt is dyarthric per chart review. Pt also had nystagmus after IV ativan given 11/29/16 per note.  Swallow evaluation ordered.    Assessment / Plan / Recommendation Clinical Impression  Pt presents with cognitive/mental status based dysphagia.  He did accept small amounts of water via tsp, cup and straw.  Suspect delayed swallow but no consistent indications of airway compromise.  Multiple sub-swallows noted which SLP suspects is due to piecemealing.  Pt did have cough response with larger bolus of thin.  Suspect resolution of dysphagia with improved mentation/resolution of ETOH withdrawls.  Recommend to continue clear liquid diet with precautions.  Will follow for dietary advancement readiness.  SLP Visit Diagnosis: Dysphagia, oropharyngeal phase (R13.12);Dysphagia, unspecified (R13.10)    Aspiration Risk  Moderate aspiration risk    Diet Recommendation Thin liquid(clear liquids)   Liquid Administration via: Cup;Straw Medication Administration: Whole meds with puree Supervision: Full supervision/cueing for compensatory strategies Compensations: Minimize environmental distractions;Slow rate;Small sips/bites Postural Changes: Seated upright at 90 degrees;Remain upright for at least 30 minutes after po intake     Other  Recommendations Oral Care Recommendations: Oral care BID   Follow up Recommendations        Frequency and Duration min 1 x/week  1 week       Prognosis Prognosis for Safe Diet Advancement: Fair Barriers to Reach Goals: Severity of deficits      Swallow Study   General Date of Onset: 11/30/16 HPI: 46 yo male adm to [facility] with alcohol withdrawl induced seizure, PMH + for ETOH use/abuse, tremors.  Pt is dyarthric per chart review. Pt also had nystagmus after IV ativan given 11/29/16 per note.  Swallow evaluation ordered.  Type of Study: Bedside Swallow Evaluation Diet Prior to this Study: Thin liquids(clears) Temperature Spikes Noted: No Respiratory Status: Room air History of Recent Intubation: No Behavior/Cognition: Alert;Other (Comment)(pt not following directions, pushes SLP away, dysarthric speech) Oral Cavity Assessment: Excessive secretions;Other (comment)(posterior oral cavity, requested RN provide oral care after SLP session) Oral Care Completed by SLP: No Oral Cavity - Dentition: Poor condition Vision: Functional for self-feeding Self-Feeding Abilities: Able to feed self Patient Positioning: Partially reclined(pt does not allow hob fully upright) Baseline Vocal Quality: Low vocal intensity Volitional Cough: Cognitively unable to elicit Volitional Swallow: Unable to elicit    Oral/Motor/Sensory Function Overall Oral Motor/Sensory Function: Generalized oral weakness(pt did not follow directions)   Ice Chips Ice chips: Not tested   Thin Liquid Thin Liquid: Impaired Presentation: Straw;Spoon;Cup Oral Phase Impairments: Reduced lingual movement/coordination;Reduced labial seal Oral Phase Functional Implications: Right anterior spillage Pharyngeal  Phase Impairments: Multiple swallows;Cough - Delayed Other Comments: delayed subtle cough x1 with larger bolus intake    Nectar Thick Nectar Thick Liquid: Not tested   Honey Thick Honey Thick Liquid:  Impaired  Presentation: Cup Oral Phase Impairments: Reduced labial seal;Reduced lingual movement/coordination Oral Phase Functional Implications: Prolonged oral transit Pharyngeal Phase Impairments: Suspected delayed Swallow   Puree Puree: Not tested Other Comments: pt pulling away from rn trying to give him medicine with applesauce   Solid   GO   Solid: Not tested Other Comments: pt's mentation does not allow intake of solids at this time        Luanna Salk, [place] [facility] [phone]

## 2016-11-30 NOTE — Plan of Care (Signed)
  Progressing Clinical Measurements: Ability to maintain clinical measurements within normal limits will improve 11/30/2016 2348 - Progressing by Talbert Forest, RN Will remain free from infection 11/30/2016 2348 - Progressing by Talbert Forest, RN Diagnostic test results will improve 11/30/2016 2348 - Progressing by Talbert Forest, RN Respiratory complications will improve 11/30/2016 2348 - Progressing by Talbert Forest, RN Cardiovascular complication will be avoided 11/30/2016 2348 - Progressing by Talbert Forest, RN Coping: Level of anxiety will decrease 11/30/2016 2348 - Progressing by Talbert Forest, RN Elimination: Will not experience complications related to bowel motility 11/30/2016 2348 - Progressing by Talbert Forest, RN Will not experience complications related to urinary retention 11/30/2016 2348 - Progressing by Talbert Forest, RN Pain Managment: General experience of comfort will improve 11/30/2016 2348 - Progressing by Talbert Forest, RN Safety: Ability to remain free from injury will improve 11/30/2016 2348 - Progressing by Talbert Forest, RN Skin Integrity: Risk for impaired skin integrity will decrease 11/30/2016 2348 - Progressing by Talbert Forest, RN

## 2016-11-30 NOTE — Progress Notes (Signed)
PROGRESS NOTE    Eugene Jackson  ZCH:[id number] DOB: 1970-09-11 DOA: 11/28/2016 PCP: [name], MD    Brief Narrative:  Eugene Jackson a 46 y.o.malewith medical history significant ofalcohol abuse, alcohol withdrawal seizure, tobacco abuse, hypertension, who presented with seizure. Per his daughter, pthad anunwitnessed seizure and found on the groundat about 10:20 AM, was admitted for Alcohol withdrawal seizures. Patient noted on the night of 11/29/2016 to spike fevers as high as 102.4. Patient pancultured.     Assessment & Plan:   Principal Problem:   Alcohol withdrawal seizure ([place]) Active Problems:   Alcohol abuse   Hypokalemia   Thrombocytopenia ([facility])   Abnormal liver function tests   Smoker   Hypomagnesemia   Essential hypertension   Acute lower UTI: Probable    PNA (pneumonia): Probable  #1 alcohol withdrawal seizure Patient with significant alcohol intake and told Dr. Candiss Norse. that his last drink was on the Friday prior to admission and noted to be in DTs. Patient somewhat drowsy. CT head negative for any acute abnormalities. Continue the Librium protocol/Ativan withdrawal protocol, IV fluids, supportive care. Continue thiamine, folic acid, multivitamin.  #2 fever Questionable etiology. Repeat chest x-ray done concerning for possible infiltrate noted in the right base. Urinalysis is also done leukocytes negative, positive nitrites, 6-30 WBCs. Urine strep pneumococcus antigen negative. Urine Legionella antigen pending. Blood cultures pending. Check urine cultures. Placed empirically on IV Unasyn to treat for probable aspiration pneumonia and UTI. Speech therapy consultation pending.  #3 probable pneumonia Per chest x-ray. Patient with history of significant alcohol abuse currently in alcohol withdrawal. Patient noted to have a fever. Urine strep pneumococcus antigen negative. Urine Legionella antigen pending. Blood cultures pending. Placed empirically  on IV Unasyn. Follow.  #4 probable UTI Check urine cultures. IV Unasyn.  #5 hypomagnesemia/hypokalemia Secondary to alcohol use, replete.  #6 hypertension Continue metoprolol.  #7 tobacco abuse Tobacco cessation. Continue nicotine patch.  #8 thrombocytopenia. Likely secondary to history of alcohol abuse. No bleeding noted. Follow.  #9 transaminitis Likely secondary to chronic alcohol abuse. Follow.   DVT prophylaxis: Heparin Code Status: Full Family Communication: No family at bedside. Disposition Plan: Pending hospitalization, to be determined.   Consultants:   None  Procedures:   CT head 11/28/2016.  Chest x-ray 11/29/2078, 11/30/2016  Antimicrobials:   IV Unasyn 11/30/2016   Subjective: Patient sleeping however easily arousable. Patient with some confusion. Patient with some tremors. Patient states some minimal improvement since admission.  Objective: Vitals:   11/29/16 2100 11/30/16 0405 11/30/16 0407 11/30/16 0609  BP: (!) 145/73 99/66  109/63  Pulse: (!) 118 (!) 114  (!) 107  Resp: 20 20  20   Temp: 98.5 F (36.9 C) (!) 101.9 F (38.8 C) (!) 102.4 F (39.1 C) (!) 100.6 F (38.1 C)  TempSrc: Oral Axillary Axillary Axillary  SpO2: 97% 100%  100%  Weight:      Height:        Intake/Output Summary (Last 24 hours) at 11/30/2016 1152 Last data filed at 11/30/2016 0612 Gross per 24 hour  Intake 1959.16 ml  Output 475 ml  Net 1484.16 ml   Filed Weights   11/29/16 0105  Weight: 65.1 kg (143 lb 8.3 oz)    Examination:  General exam: Profuse. In mittens. Tremors. Respiratory system: Clear to auscultation anterior lung fields. No wheezing, no crackles, no rhonchi. Respiratory effort normal. Cardiovascular system: S1 & S2 heard, tachycardia. No JVD, murmurs, rubs, gallops or clicks. No pedal edema. Gastrointestinal system: Abdomen  is nondistended, soft and nontender. No organomegaly or masses felt. Normal bowel sounds heard. Central nervous  system: Easily arousable. Moving extremities spontaneously. No focal deficits.  Extremities: Symmetric 5 x 5 power. Skin: No rashes, lesions or ulcers Psychiatry: Judgement and insight appear poor. Mood & affect appropriate.     Data Reviewed: I have personally reviewed following labs and imaging studies  CBC: Recent Labs  Lab 11/28/16 1230 11/29/16 0125 11/30/16 0457  WBC 7.0 11.6* 9.5  NEUTROABS 5.9  --  8.0*  HGB 11.4* 11.8* 10.6*  HCT 31.6* 32.9* 30.9*  MCV 110.5* 111.9* 115.3*  PLT 130* 96* 82*   Basic Metabolic Panel: Recent Labs  Lab 11/28/16 1230 11/29/16 0125 11/29/16 0230 11/30/16 0457  NA 130* 133*  --  140  K 3.3* 2.7*  --  3.2*  CL 86* 94*  --  108  CO2 26 20*  --  22  GLUCOSE 137* 128*  --  124*  BUN <5* <5*  --  8  CREATININE 0.54* 0.63  --  0.75  CALCIUM 8.3* 7.8*  --  7.8*  MG 0.9*  --  1.7 1.6*   GFR: Estimated Creatinine Clearance: 104 mL/min (by C-G formula based on SCr of 0.75 mg/dL). Liver Function Tests: Recent Labs  Lab 11/28/16 1230 11/29/16 0125 11/30/16 0457  AST 104* 92* 98*  ALT 27 23 23   ALKPHOS 108 93 81  BILITOT 2.6* 2.3* 2.4*  PROT 7.0 6.7 6.2*  ALBUMIN 3.8 3.7 3.5   No results for input(s): LIPASE, AMYLASE in the last 168 hours. No results for input(s): AMMONIA in the last 168 hours. Coagulation Profile: No results for input(s): INR, PROTIME in the last 168 hours. Cardiac Enzymes: No results for input(s): CKTOTAL, CKMB, CKMBINDEX, TROPONINI in the last 168 hours. BNP (last 3 results) No results for input(s): PROBNP in the last 8760 hours. HbA1C: No results for input(s): HGBA1C in the last 72 hours. CBG: Recent Labs  Lab 11/28/16 1155 11/30/16 0747  GLUCAP 182* 106*   Lipid Profile: No results for input(s): CHOL, HDL, LDLCALC, TRIG, CHOLHDL, LDLDIRECT in the last 72 hours. Thyroid Function Tests: No results for input(s): TSH, T4TOTAL, FREET4, T3FREE, THYROIDAB in the last 72 hours. Anemia Panel: No results  for input(s): VITAMINB12, FOLATE, FERRITIN, TIBC, IRON, RETICCTPCT in the last 72 hours. Sepsis Labs: Recent Labs  Lab 11/30/16 0454 11/30/16 0740  LATICACIDVEN 2.5* 2.3*    No results found for this or any previous visit (from the past 240 hour(s)).       Radiology Studies: Ct Head Wo Contrast  Result Date: 11/28/2016 CLINICAL DATA:  46 y/o male found down. Un-witnessed seizure. Postictal on EMS arrival. EXAM: CT HEAD WITHOUT CONTRAST TECHNIQUE: Contiguous axial images were obtained from the base of the skull through the vertex without intravenous contrast. COMPARISON:  Head CT 09/04/2012 and earlier. FINDINGS: Brain: Stable cerebral volume. No midline shift, ventriculomegaly, mass effect, evidence of mass lesion, intracranial hemorrhage or evidence of cortically based acute infarction. Gray-white matter differentiation is within normal limits throughout the brain. Vascular: Calcified atherosclerosis at the skull base. No suspicious intracranial vascular hyperdensity. Skull: Stable.  No skull fracture identified. Sinuses/Orbits: Chronic but increased partially visible right maxillary sinus opacification. Improved right sphenoid sinus aeration. Other visible paranasal sinuses and mastoids are stable and well pneumatized. Other: Left posterosuperior scalp hematoma or contusion measuring up to 8 mm in thickness. Otherwise negative Visualized orbits and scalp soft tissues are within normal limits. IMPRESSION: 1. Left superior  scalp soft tissue injury without underlying fracture. 2. No acute intracranial abnormality. Chronic brain volume loss but stable and otherwise negative noncontrast CT appearance of the brain. Electronically Signed   By: Genevie Ann M.D.   On: 11/28/2016 13:27   Dg Chest Port 1 View  Result Date: 11/30/2016 CLINICAL DATA:  Fever.  Confusion. EXAM: PORTABLE CHEST 1 VIEW COMPARISON:  Radiographs 02/29/2016, more remote exam 11/26/2012 FINDINGS: Developing patchy bibasilar airspace  disease. Left lung base scarring again seen. Normal heart size and mediastinal contours. Prominent central pulmonary vasculature without pulmonary edema. No pneumothorax or large pleural effusion. IMPRESSION: Developing patchy bibasilar airspace disease, atelectasis, pneumonia, or aspiration. Prominence of central pulmonary vasculature without overt edema. Left lung base scarring again seen. Electronically Signed   By: Jeb Levering M.D.   On: 11/30/2016 04:48   Dg Chest Port 1 View  Result Date: 11/28/2016 CLINICAL DATA:  Seizure and cough EXAM: PORTABLE CHEST 1 VIEW COMPARISON:  11/26/2012 FINDINGS: Mild left pleural and parenchymal scarring at the left lung base. Right lung is clear. No acute consolidation. Normal cardiomediastinal silhouette with atherosclerosis. No pneumothorax. IMPRESSION: Left basilar pleural and parenchymal scarring. No acute infiltrate or edema. Electronically Signed   By: Donavan Foil M.D.   On: 11/28/2016 22:51        Scheduled Meds: . vitamin A & D      . chlordiazePOXIDE  15 mg Oral TID  . folic acid  1 mg Oral Daily  . heparin subcutaneous  5,000 Units Subcutaneous Q8H  . Influenza vac split quadrivalent PF  0.5 mL Intramuscular Tomorrow-1000  . LORazepam  0-4 mg Intravenous Q6H   Followed by  . LORazepam  0-4 mg Intravenous Q12H  . magnesium oxide  400 mg Oral Daily  . mouth rinse  15 mL Mouth Rinse BID  . metoprolol tartrate  25 mg Oral BID  . multivitamin with minerals  1 tablet Oral Daily  . nicotine  21 mg Transdermal Daily  . potassium chloride  40 mEq Oral Q4H  . thiamine  100 mg Oral Daily   Or  . thiamine  100 mg Intravenous Daily   Continuous Infusions: . ampicillin-sulbactam (UNASYN) IV    . dextrose 5 % and 0.45 % NaCl with KCl 40 mEq/L 100 mL/hr at 11/30/16 0253  . magnesium sulfate 1 - 4 g bolus IVPB       LOS: 2 days    Time spent: 35 minutes    Irine Seal, MD [facility] Pager [phone]  If 7PM-7AM,  please contact night-coverage [email or web address] Password [facility] 11/30/2016, 11:52 AM

## 2016-11-30 NOTE — Progress Notes (Signed)
Patient is very restless, difficult to start IV. RN to put consult backl when patient calm down.

## 2016-11-30 NOTE — Progress Notes (Signed)
CRITICAL VALUE ALERT  Critical Value:  Lactic Acid = 2.3  Date & Time Notied:  11/28 @ 0920  Provider Notified: Dr Grandville Silos, MD  Orders Received/Actions taken: YES

## 2016-12-01 LAB — BLOOD CULTURE ID PANEL (REFLEXED)
Acinetobacter baumannii: NOT DETECTED
CANDIDA ALBICANS: NOT DETECTED
CANDIDA GLABRATA: NOT DETECTED
Candida krusei: NOT DETECTED
Candida parapsilosis: NOT DETECTED
Candida tropicalis: NOT DETECTED
ENTEROBACTER CLOACAE COMPLEX: NOT DETECTED
ENTEROBACTERIACEAE SPECIES: NOT DETECTED
ENTEROCOCCUS SPECIES: NOT DETECTED
Escherichia coli: NOT DETECTED
Haemophilus influenzae: NOT DETECTED
Klebsiella oxytoca: NOT DETECTED
Klebsiella pneumoniae: NOT DETECTED
LISTERIA MONOCYTOGENES: NOT DETECTED
Methicillin resistance: NOT DETECTED
NEISSERIA MENINGITIDIS: NOT DETECTED
PSEUDOMONAS AERUGINOSA: NOT DETECTED
Proteus species: NOT DETECTED
STREPTOCOCCUS AGALACTIAE: NOT DETECTED
STREPTOCOCCUS PYOGENES: NOT DETECTED
STREPTOCOCCUS SPECIES: NOT DETECTED
Serratia marcescens: NOT DETECTED
Staphylococcus aureus (BCID): NOT DETECTED
Staphylococcus species: DETECTED — AB
Streptococcus pneumoniae: NOT DETECTED

## 2016-12-01 LAB — BASIC METABOLIC PANEL
ANION GAP: 8 (ref 5–15)
BUN: 11 mg/dL (ref 6–20)
CALCIUM: 7.5 mg/dL — AB (ref 8.9–10.3)
CHLORIDE: 112 mmol/L — AB (ref 101–111)
CO2: 23 mmol/L (ref 22–32)
CREATININE: 0.64 mg/dL (ref 0.61–1.24)
GFR calc non Af Amer: >60 mL/min (ref >60)
Glucose, Bld: 96 mg/dL (ref 65–99)
Potassium: 3.2 mmol/L — ABNORMAL LOW (ref 3.5–5.1)
SODIUM: 143 mmol/L (ref 135–145)

## 2016-12-01 LAB — HIV ANTIBODY (ROUTINE TESTING W REFLEX): HIV Screen 4th Generation wRfx: NONREACTIVE

## 2016-12-01 LAB — CBC
HCT: 27.5 % — ABNORMAL LOW (ref 39.0–52.0)
Hemoglobin: 9.3 g/dL — ABNORMAL LOW (ref 13.0–17.0)
MCH: 39.9 pg — ABNORMAL HIGH (ref 26.0–34.0)
MCHC: 33.8 g/dL (ref 30.0–36.0)
MCV: 118 fL — ABNORMAL HIGH (ref 78.0–100.0)
PLATELETS: 99 10*3/uL — AB (ref 150–400)
RBC: 2.33 MIL/uL — AB (ref 4.22–5.81)
RDW: 13.5 % (ref 11.5–15.5)
WBC: 7.5 10*3/uL (ref 4.0–10.5)

## 2016-12-01 LAB — GLUCOSE, CAPILLARY: GLUCOSE-CAPILLARY: 84 mg/dL (ref 65–99)

## 2016-12-01 LAB — LACTIC ACID, PLASMA: Lactic Acid, Venous: 1.3 mmol/L (ref 0.5–1.9)

## 2016-12-01 LAB — MAGNESIUM: Magnesium: 2.2 mg/dL (ref 1.7–2.4)

## 2016-12-01 MED ORDER — POTASSIUM CHLORIDE 10 MEQ/100ML IV SOLN
10.0000 meq | INTRAVENOUS | Status: AC
  Administered 2016-12-01 (×5): 10 meq via INTRAVENOUS
  Filled 2016-12-01 (×5): qty 100

## 2016-12-01 NOTE — Progress Notes (Signed)
PROGRESS NOTE    Eugene Jackson  YBW:[id number] DOB: 02/24/1970 DOA: 11/28/2016 PCP: [name], MD    Brief Narrative:  Eugene Jackson a 46 y.o.malewith medical history significant ofalcohol abuse, alcohol withdrawal seizure, tobacco abuse, hypertension, who presented with seizure. Per his daughter, pthad anunwitnessed seizure and found on the groundat about 10:20 AM, was admitted for Alcohol withdrawal seizures. Patient noted on the night of 11/29/2016 to spike fevers as high as 102.4. Patient pancultured.     Assessment & Plan:   Principal Problem:   Alcohol withdrawal seizure ([place]) Active Problems:   Alcohol abuse   Hypokalemia   Thrombocytopenia ([facility])   Abnormal liver function tests   Smoker   Hypomagnesemia   Essential hypertension   Acute lower UTI: Probable    PNA (pneumonia): Probable   Fever  #1 alcohol withdrawal seizure Patient with significant alcohol intake and told Dr. Candiss Norse. that his last drink was on the Friday prior to admission and noted to be in DTs. Patient more alert today and sitting up and communicating. CT head negative for any acute abnormalities. Continue the Librium protocol/Ativan withdrawal protocol, IV fluids, supportive care. Continue thiamine, folic acid, multivitamin.  #2 fever Questionable etiology. Repeat chest x-ray done concerning for possible infiltrate noted in the right base. Urinalysis is also done leukocytes negative, positive nitrites, 6-30 WBCs. Urine strep pneumococcus antigen negative. Urine Legionella antigen pending. Blood cultures pending.  Urine cultures pending.  Continue empiric IV Unasyn to treat for probable aspiration pneumonia and UTI. Speech therapy consultation ff.  #3 probable pneumonia Per chest x-ray. Patient with history of significant alcohol abuse currently in alcohol withdrawal. Patient noted to have a fever. Urine strep pneumococcus antigen negative. Urine Legionella antigen pending. Blood  cultures pending.  Continue empiric IV Unasyn. Follow.  #4 probable UTI Urine culture is pending. Continue IV Unasyn.    #5 hypomagnesemia/hypokalemia Replete potassium.  Keep magnesium greater than 2.  #6 hypertension Continue metoprolol.  #7 tobacco abuse Tobacco cessation. Continue nicotine patch.  #8 thrombocytopenia. Likely secondary to history of alcohol abuse. No bleeding. Follow.  #9 transaminitis Likely secondary to chronic alcohol abuse. Follow.   DVT prophylaxis: Heparin Code Status: Full Family Communication: No family at bedside. Disposition Plan: Pending hospitalization, to be determined.   Consultants:   None  Procedures:   CT head 11/28/2016.  Chest x-ray 11/29/2078, 11/30/2016  Antimicrobials:   IV Unasyn 11/30/2016   Subjective: Patient sitting up in chair.  Patient with some confusion however slowly improving since admission.  No chest pain.  No shortness of breath.  Trying to order some food.  Tolerating clears.    Objective: Vitals:   11/30/16 2244 12/01/16 0547 12/01/16 0549 12/01/16 0925  BP: 123/76 94/65 94/65  102/68  Pulse: 100 91 93 94  Resp: 20 (!) 22 (!) 21   Temp: 98.7 F (37.1 C) 99.5 F (37.5 C) 99.5 F (37.5 C)   TempSrc: Oral Oral    SpO2: 97% 100% 100%   Weight:      Height:        Intake/Output Summary (Last 24 hours) at 12/01/2016 1127 Last data filed at 12/01/2016 1109 Gross per 24 hour  Intake 720 ml  Output 400 ml  Net 320 ml   Filed Weights   11/29/16 0105  Weight: 65.1 kg (143 lb 8.3 oz)    Examination:  General exam: Sitting up in chair.Tremors. Respiratory system: Some scattered crackles noted.  No wheezing.  No rhonchi. Respiratory effort  normal. Cardiovascular system: S1 & S2 heard, tachycardia. No JVD, murmurs, rubs, gallops or clicks. No pedal edema. Gastrointestinal system: Abdomen is nondistended, soft and nontender. No organomegaly or masses felt. Normal bowel sounds heard. Central nervous  system: Sitting up in chair.  Alert.  Moving extremities spontaneously.  No focal deficits.  Extremities: Symmetric 5 x 5 power. Skin: No rashes, lesions or ulcers Psychiatry: Judgement and insight appear poor. Mood & affect appropriate.     Data Reviewed: I have personally reviewed following labs and imaging studies  CBC: Recent Labs  Lab 11/28/16 1230 11/29/16 0125 11/30/16 0457 12/01/16 0456  WBC 7.0 11.6* 9.5 7.5  NEUTROABS 5.9  --  8.0*  --   HGB 11.4* 11.8* 10.6* 9.3*  HCT 31.6* 32.9* 30.9* 27.5*  MCV 110.5* 111.9* 115.3* 118.0*  PLT 130* 96* 82* 99*   Basic Metabolic Panel: Recent Labs  Lab 11/28/16 1230 11/29/16 0125 11/29/16 0230 11/30/16 0457 12/01/16 0456  NA 130* 133*  --  140 143  K 3.3* 2.7*  --  3.2* 3.2*  CL 86* 94*  --  108 112*  CO2 26 20*  --  22 23  GLUCOSE 137* 128*  --  124* 96  BUN <5* <5*  --  8 11  CREATININE 0.54* 0.63  --  0.75 0.64  CALCIUM 8.3* 7.8*  --  7.8* 7.5*  MG 0.9*  --  1.7 1.6* 2.2   GFR: Estimated Creatinine Clearance: 104 mL/min (by C-G formula based on SCr of 0.64 mg/dL). Liver Function Tests: Recent Labs  Lab 11/28/16 1230 11/29/16 0125 11/30/16 0457  AST 104* 92* 98*  ALT 27 23 23   ALKPHOS 108 93 81  BILITOT 2.6* 2.3* 2.4*  PROT 7.0 6.7 6.2*  ALBUMIN 3.8 3.7 3.5   No results for input(s): LIPASE, AMYLASE in the last 168 hours. No results for input(s): AMMONIA in the last 168 hours. Coagulation Profile: No results for input(s): INR, PROTIME in the last 168 hours. Cardiac Enzymes: No results for input(s): CKTOTAL, CKMB, CKMBINDEX, TROPONINI in the last 168 hours. BNP (last 3 results) No results for input(s): PROBNP in the last 8760 hours. HbA1C: No results for input(s): HGBA1C in the last 72 hours. CBG: Recent Labs  Lab 11/28/16 1155 11/30/16 0747 12/01/16 0800  GLUCAP 182* 106* 84   Lipid Profile: No results for input(s): CHOL, HDL, LDLCALC, TRIG, CHOLHDL, LDLDIRECT in the last 72 hours. Thyroid  Function Tests: No results for input(s): TSH, T4TOTAL, FREET4, T3FREE, THYROIDAB in the last 72 hours. Anemia Panel: No results for input(s): VITAMINB12, FOLATE, FERRITIN, TIBC, IRON, RETICCTPCT in the last 72 hours. Sepsis Labs: Recent Labs  Lab 11/30/16 0454 11/30/16 0740 12/01/16 0859  LATICACIDVEN 2.5* 2.3* 1.3    Recent Results (from the past 240 hour(s))  Culture, blood (routine x 2)     Status: None (Preliminary result)   Collection Time: 11/30/16  4:46 AM  Result Value Ref Range Status   Specimen Description BLOOD RIGHT ANTECUBITAL  Final   Special Requests IN PEDIATRIC BOTTLE Blood Culture adequate volume  Final   Culture  Setup Time   Final    GRAM POSITIVE COCCI IN CLUSTERS IN PEDIATRIC BOTTLE Organism ID to follow CRITICAL RESULT CALLED TO, READ BACK BY AND VERIFIED WITH: B GREEN PHARMD [id number] 12/01/16 A BROWNING Performed at [facility], [place] [address], [place], [place] [address]    Culture PENDING  Incomplete   Report Status PENDING  Incomplete  Blood Culture ID  Panel (Reflexed)     Status: Abnormal   Collection Time: 11/30/16  4:46 AM  Result Value Ref Range Status   Enterococcus species NOT DETECTED NOT DETECTED Final   Listeria monocytogenes NOT DETECTED NOT DETECTED Final   Staphylococcus species DETECTED (A) NOT DETECTED Final    Comment: Methicillin (oxacillin) susceptible coagulase negative staphylococcus. Possible blood culture contaminant (unless isolated from more than one blood culture draw or clinical case suggests pathogenicity). No antibiotic treatment is indicated for blood  culture contaminants. CRITICAL RESULT CALLED TO, READ BACK BY AND VERIFIED WITH: B GREEN PHARMD [id number] 12/01/16 A BROWNING    Staphylococcus aureus NOT DETECTED NOT DETECTED Final   Methicillin resistance NOT DETECTED NOT DETECTED Final   Streptococcus species NOT DETECTED NOT DETECTED Final   Streptococcus agalactiae NOT DETECTED NOT DETECTED Final   Streptococcus  pneumoniae NOT DETECTED NOT DETECTED Final   Streptococcus pyogenes NOT DETECTED NOT DETECTED Final   Acinetobacter baumannii NOT DETECTED NOT DETECTED Final   Enterobacteriaceae species NOT DETECTED NOT DETECTED Final   Enterobacter cloacae complex NOT DETECTED NOT DETECTED Final   Escherichia coli NOT DETECTED NOT DETECTED Final   Klebsiella oxytoca NOT DETECTED NOT DETECTED Final   Klebsiella pneumoniae NOT DETECTED NOT DETECTED Final   Proteus species NOT DETECTED NOT DETECTED Final   Serratia marcescens NOT DETECTED NOT DETECTED Final   Haemophilus influenzae NOT DETECTED NOT DETECTED Final   Neisseria meningitidis NOT DETECTED NOT DETECTED Final   Pseudomonas aeruginosa NOT DETECTED NOT DETECTED Final   Candida albicans NOT DETECTED NOT DETECTED Final   Candida glabrata NOT DETECTED NOT DETECTED Final   Candida krusei NOT DETECTED NOT DETECTED Final   Candida parapsilosis NOT DETECTED NOT DETECTED Final   Candida tropicalis NOT DETECTED NOT DETECTED Final    Comment: Performed at [facility], [place] [address], [place], [place] [address]         Radiology Studies: Dg Chest Port 1 View  Result Date: 11/30/2016 CLINICAL DATA:  Fever.  Confusion. EXAM: PORTABLE CHEST 1 VIEW COMPARISON:  Radiographs 02/29/2016, more remote exam 11/26/2012 FINDINGS: Developing patchy bibasilar airspace disease. Left lung base scarring again seen. Normal heart size and mediastinal contours. Prominent central pulmonary vasculature without pulmonary edema. No pneumothorax or large pleural effusion. IMPRESSION: Developing patchy bibasilar airspace disease, atelectasis, pneumonia, or aspiration. Prominence of central pulmonary vasculature without overt edema. Left lung base scarring again seen. Electronically Signed   By: Jeb Levering M.D.   On: 11/30/2016 04:48        Scheduled Meds: . chlordiazePOXIDE  15 mg Oral TID  . folic acid  1 mg Oral Daily  . heparin subcutaneous  5,000 Units  Subcutaneous Q8H  . Influenza vac split quadrivalent PF  0.5 mL Intramuscular Tomorrow-1000  . LORazepam  0-4 mg Intravenous Q12H  . magnesium oxide  400 mg Oral Daily  . mouth rinse  15 mL Mouth Rinse BID  . metoprolol tartrate  25 mg Oral BID  . multivitamin with minerals  1 tablet Oral Daily  . nicotine  21 mg Transdermal Daily  . thiamine  100 mg Oral Daily   Or  . thiamine  100 mg Intravenous Daily   Continuous Infusions: . ampicillin-sulbactam (UNASYN) IV Stopped (12/01/16 0343)  . potassium chloride Stopped (12/01/16 1109)     LOS: 3 days    Time spent: 35 minutes    Irine Seal, MD [facility] Pager [phone]  If 7PM-7AM, please contact night-coverage [email or web address] Password [email or web address]  12/01/2016, 11:27 AM

## 2016-12-01 NOTE — Progress Notes (Signed)
PHARMACY - PHYSICIAN COMMUNICATION CRITICAL VALUE ALERT - BLOOD CULTURE IDENTIFICATION (BCID)  Eugene Jackson is an 46 y.o. male who presented to [facility] on 11/28/2016 with a chief complaint of PNA or aspiration  WBCs=9.5, T=98.7 Gm+ cocci in clusters > staph species in 1 bottle.   Name of physician (or Provider) Contacted: Tylene Fantasia NP  Current antibiotics: Unasyn   Changes to prescribed antibiotics recommended:  Likely contaminant- no change needed at this time  Results for orders placed or performed during the hospital encounter of 11/28/16  Blood Culture ID Panel (Reflexed) (Collected: 11/30/2016  4:46 AM)  Result Value Ref Range   Enterococcus species NOT DETECTED NOT DETECTED   Listeria monocytogenes NOT DETECTED NOT DETECTED   Staphylococcus species DETECTED (A) NOT DETECTED   Staphylococcus aureus NOT DETECTED NOT DETECTED   Methicillin resistance NOT DETECTED NOT DETECTED   Streptococcus species NOT DETECTED NOT DETECTED   Streptococcus agalactiae NOT DETECTED NOT DETECTED   Streptococcus pneumoniae NOT DETECTED NOT DETECTED   Streptococcus pyogenes NOT DETECTED NOT DETECTED   Acinetobacter baumannii NOT DETECTED NOT DETECTED   Enterobacteriaceae species NOT DETECTED NOT DETECTED   Enterobacter cloacae complex NOT DETECTED NOT DETECTED   Escherichia coli NOT DETECTED NOT DETECTED   Klebsiella oxytoca NOT DETECTED NOT DETECTED   Klebsiella pneumoniae NOT DETECTED NOT DETECTED   Proteus species NOT DETECTED NOT DETECTED   Serratia marcescens NOT DETECTED NOT DETECTED   Haemophilus influenzae NOT DETECTED NOT DETECTED   Neisseria meningitidis NOT DETECTED NOT DETECTED   Pseudomonas aeruginosa NOT DETECTED NOT DETECTED   Candida albicans NOT DETECTED NOT DETECTED   Candida glabrata NOT DETECTED NOT DETECTED   Candida krusei NOT DETECTED NOT DETECTED   Candida parapsilosis NOT DETECTED NOT DETECTED   Candida tropicalis NOT DETECTED NOT DETECTED    Dorrene German 12/01/2016  3:17 AM

## 2016-12-02 DIAGNOSIS — D649 Anemia, unspecified: Secondary | ICD-10-CM | POA: Diagnosis present

## 2016-12-02 LAB — CBC WITH DIFFERENTIAL/PLATELET
BASOS ABS: 0 10*3/uL (ref 0.0–0.1)
Basophils Relative: 0 %
EOS ABS: 0.3 10*3/uL (ref 0.0–0.7)
Eosinophils Relative: 5 %
HCT: 27 % — ABNORMAL LOW (ref 39.0–52.0)
HEMOGLOBIN: 9.1 g/dL — AB (ref 13.0–17.0)
LYMPHS PCT: 24 %
Lymphs Abs: 1.6 10*3/uL (ref 0.7–4.0)
MCH: 39.6 pg — ABNORMAL HIGH (ref 26.0–34.0)
MCHC: 33.7 g/dL (ref 30.0–36.0)
MCV: 117.4 fL — ABNORMAL HIGH (ref 78.0–100.0)
Monocytes Absolute: 1.2 10*3/uL — ABNORMAL HIGH (ref 0.1–1.0)
Monocytes Relative: 17 %
NEUTROS ABS: 3.7 10*3/uL (ref 1.7–7.7)
NEUTROS PCT: 54 %
PLATELETS: 114 10*3/uL — AB (ref 150–400)
RBC: 2.3 MIL/uL — ABNORMAL LOW (ref 4.22–5.81)
RDW: 13.2 % (ref 11.5–15.5)
WBC: 6.8 10*3/uL (ref 4.0–10.5)

## 2016-12-02 LAB — MAGNESIUM: MAGNESIUM: 2.1 mg/dL (ref 1.7–2.4)

## 2016-12-02 LAB — URINE CULTURE

## 2016-12-02 LAB — LEGIONELLA PNEUMOPHILA SEROGP 1 UR AG: L. PNEUMOPHILA SEROGP 1 UR AG: NEGATIVE

## 2016-12-02 LAB — COMPREHENSIVE METABOLIC PANEL
ALBUMIN: 2.9 g/dL — AB (ref 3.5–5.0)
ALK PHOS: 74 U/L (ref 38–126)
ALT: 20 U/L (ref 17–63)
ANION GAP: 7 (ref 5–15)
AST: 57 U/L — ABNORMAL HIGH (ref 15–41)
BUN: 9 mg/dL (ref 6–20)
CALCIUM: 7.6 mg/dL — AB (ref 8.9–10.3)
CHLORIDE: 106 mmol/L (ref 101–111)
CO2: 25 mmol/L (ref 22–32)
Creatinine, Ser: 0.57 mg/dL — ABNORMAL LOW (ref 0.61–1.24)
GFR calc Af Amer: >60 mL/min (ref >60)
GFR calc non Af Amer: >60 mL/min (ref >60)
GLUCOSE: 112 mg/dL — AB (ref 65–99)
POTASSIUM: 2.9 mmol/L — AB (ref 3.5–5.1)
SODIUM: 138 mmol/L (ref 135–145)
Total Bilirubin: 1.5 mg/dL — ABNORMAL HIGH (ref 0.3–1.2)
Total Protein: 5.9 g/dL — ABNORMAL LOW (ref 6.5–8.1)

## 2016-12-02 LAB — IRON AND TIBC
IRON: 26 ug/dL — AB (ref 45–182)
SATURATION RATIOS: 13 % — AB (ref 17.9–39.5)
TIBC: 197 ug/dL — AB (ref 250–450)
UIBC: 171 ug/dL

## 2016-12-02 LAB — CULTURE, BLOOD (ROUTINE X 2): Special Requests: ADEQUATE

## 2016-12-02 LAB — FOLATE: FOLATE: 11 ng/mL (ref >5.9)

## 2016-12-02 LAB — GLUCOSE, CAPILLARY: Glucose-Capillary: 96 mg/dL (ref 65–99)

## 2016-12-02 LAB — VITAMIN B12: VITAMIN B 12: 263 pg/mL (ref 180–914)

## 2016-12-02 LAB — RETICULOCYTES
RBC.: 2.25 MIL/uL — ABNORMAL LOW (ref 4.22–5.81)
Retic Count, Absolute: 92.3 10*3/uL (ref 19.0–186.0)
Retic Ct Pct: 4.1 % — ABNORMAL HIGH (ref 0.4–3.1)

## 2016-12-02 LAB — FERRITIN: Ferritin: 519 ng/mL — ABNORMAL HIGH (ref 24–336)

## 2016-12-02 MED ORDER — CHLORDIAZEPOXIDE HCL 5 MG PO CAPS
15.0000 mg | ORAL_CAPSULE | Freq: Two times a day (BID) | ORAL | Status: AC
  Administered 2016-12-02 – 2016-12-03 (×3): 15 mg via ORAL
  Filled 2016-12-02 (×3): qty 3

## 2016-12-02 MED ORDER — POTASSIUM CHLORIDE CRYS ER 20 MEQ PO TBCR
40.0000 meq | EXTENDED_RELEASE_TABLET | ORAL | Status: AC
  Administered 2016-12-02 (×3): 40 meq via ORAL
  Filled 2016-12-02 (×3): qty 2

## 2016-12-02 MED ORDER — CHLORDIAZEPOXIDE HCL 5 MG PO CAPS: 15.0000 mg | ORAL_CAPSULE | Freq: Two times a day (BID) | ORAL | Status: DC

## 2016-12-02 MED ORDER — AMOXICILLIN-POT CLAVULANATE 875-125 MG PO TABS
1.0000 | ORAL_TABLET | Freq: Two times a day (BID) | ORAL | Status: DC
  Administered 2016-12-02 – 2016-12-03 (×2): 1 via ORAL
  Filled 2016-12-02 (×3): qty 1

## 2016-12-02 NOTE — Progress Notes (Signed)
PROGRESS NOTE    Eugene Jackson  BZJ:[id number] DOB: Oct 07, 1970 DOA: 11/28/2016 PCP: [name], MD    Brief Narrative:  Eugene Jackson medical history significant ofalcohol abuse, alcohol withdrawal seizure, tobacco abuse, hypertension, who presented with seizure. Per his daughter, pthad anunwitnessed seizure and found on the groundat about 10:20 AM, was admitted for Alcohol withdrawal seizures. Patient noted on the night of 11/29/2016 to spike fevers as high as 102.4. Patient pancultured.     Assessment & Plan:   Principal Problem:   Alcohol withdrawal seizure ([place]) Active Problems:   Alcohol abuse   Hypokalemia   Thrombocytopenia ([facility])   Abnormal liver function tests   Smoker   Hypomagnesemia   Essential hypertension   Acute lower UTI: Probable    PNA (pneumonia): Probable   Fever   Anemia  #1 alcohol withdrawal seizure Patient with significant alcohol intake and told Dr. Candiss Norse. that his last drink was on the Friday prior to admission and noted to be in DTs. Patient more alert today and sitting up and communicating.  Patient tolerating diet.  CT head negative for any acute abnormalities. Continue the Librium protocol/Ativan withdrawal protocol, IV fluids, supportive care. Continue thiamine, folic acid, multivitamin.  #2 fever Questionable etiology. Repeat chest x-ray done concerning for possible infiltrate noted in the right base. Urinalysis is also done leukocytes negative, positive nitrites, 6-30 WBCs. Urine strep pneumococcus antigen negative. Urine Legionella antigen negative.1/2 blood cultures with coagulase-negative staph likely contaminant.  Urine cultures with multiple species.  Fever curve has trended down.  Patient improved clinically.  Change IV Unasyn to oral Augmentin to complete course of antibiotic treatment.  #3 probable pneumonia Per chest x-ray. Patient with history of significant alcohol abuse currently in alcohol  withdrawal. Patient noted to have a fever. Urine strep pneumococcus antigen negative. Urine Legionella antigen negative. 1/2 Blood cultures with coagulase-negative staph likely contaminant.  Will change IV Unasyn to oral Augmentin to complete a course of antibiotic treatment.    #4  Bacteria in urine Urine culture is with multiple species present.  Patient on antibiotics.    #5 hypomagnesemia/hypokalemia Replete potassium.  Keep magnesium greater than 2.  #6 hypertension Pressure borderline.  Discontinue metoprolol.    #7 tobacco abuse Tobacco cessation. Continue nicotine patch.  #8 thrombocytopenia. Likely secondary to history of alcohol abuse. No bleeding. Follow.  #9 transaminitis Likely secondary to chronic alcohol abuse. Follow.  10 1/2 blood cultures with coagulase negative staph Likely contaminant.   DVT prophylaxis: Heparin Code Status: Full Family Communication: No family at bedside. Disposition Plan: If continued improvement likely home in the next 24-48 hours.    Consultants:   None  Procedures:   CT head 11/28/2016.  Chest x-ray 11/29/2078, 11/30/2016  Antimicrobials:   IV Unasyn 11/30/2016>>>>> 12/02/2016  Augmentin 12/02/2016   Subjective: Patient sitting up in chair.  Patient states ambulated in hallway feels better.  Confusion improved.  No chest pain.  No shortness of breath.  Tolerating current diet.    Objective: Vitals:   12/01/16 2300 12/01/16 2354 12/02/16 0427 12/02/16 1336  BP:  94/64 (!) 91/59 97/68  Pulse:  75 75 87  Resp:   16 18  Temp: 99.6 F (37.6 C)  98.1 F (36.7 C) 99.4 F (37.4 C)  TempSrc: Oral  Oral Oral  SpO2:   100% 100%  Weight:      Height:        Intake/Output Summary (Last 24 hours) at 12/02/2016 1955  Last data filed at 12/02/2016 1739 Gross per 24 hour  Intake 760 ml  Output 600 ml  Net 160 ml   Filed Weights   11/29/16 0105  Weight: 65.1 kg (143 lb 8.3 oz)    Examination:  General exam: Sitting  up in chair.  Tremors improved Respiratory system: Clear to auscultation bilaterally.  No wheezes, no crackles, no rhonchi.  Respiratory effort normal. Cardiovascular system: Regular rate and rhythm no murmurs rubs or gallops.  No JVD.  No pedal edema.   Gastrointestinal system: Abdomen is soft, nontender, nondistended, positive bowel sounds.  Central nervous system: Sitting up in chair.  Alert.  Moving extremities spontaneously.  No focal deficits.  Extremities: Symmetric 5 x 5 power. Skin: No rashes, lesions or ulcers Psychiatry: Judgement and insight appear fair. Mood & affect appropriate.     Data Reviewed: I have personally reviewed following labs and imaging studies  CBC: Recent Labs  Lab 11/28/16 1230 11/29/16 0125 11/30/16 0457 12/01/16 0456 12/02/16 0512  WBC 7.0 11.6* 9.5 7.5 6.8  NEUTROABS 5.9  --  8.0*  --  3.7  HGB 11.4* 11.8* 10.6* 9.3* 9.1*  HCT 31.6* 32.9* 30.9* 27.5* 27.0*  MCV 110.5* 111.9* 115.3* 118.0* 117.4*  PLT 130* 96* 82* 99* 027*   Basic Metabolic Panel: Recent Labs  Lab 11/28/16 1230 11/29/16 0125 11/29/16 0230 11/30/16 0457 12/01/16 0456 12/02/16 0512  NA 130* 133*  --  140 143 138  K 3.3* 2.7*  --  3.2* 3.2* 2.9*  CL 86* 94*  --  108 112* 106  CO2 26 20*  --  22 23 25   GLUCOSE 137* 128*  --  124* 96 112*  BUN <5* <5*  --  8 11 9   CREATININE 0.54* 0.63  --  0.75 0.64 0.57*  CALCIUM 8.3* 7.8*  --  7.8* 7.5* 7.6*  MG 0.9*  --  1.7 1.6* 2.2 2.1   GFR: Estimated Creatinine Clearance: 104 mL/min (A) (by C-G formula based on SCr of 0.57 mg/dL (L)). Liver Function Tests: Recent Labs  Lab 11/28/16 1230 11/29/16 0125 11/30/16 0457 12/02/16 0512  AST 104* 92* 98* 57*  ALT 27 23 23 20   ALKPHOS 108 93 81 74  BILITOT 2.6* 2.3* 2.4* 1.5*  PROT 7.0 6.7 6.2* 5.9*  ALBUMIN 3.8 3.7 3.5 2.9*   No results for input(s): LIPASE, AMYLASE in the last 168 hours. No results for input(s): AMMONIA in the last 168 hours. Coagulation Profile: No results  for input(s): INR, PROTIME in the last 168 hours. Cardiac Enzymes: No results for input(s): CKTOTAL, CKMB, CKMBINDEX, TROPONINI in the last 168 hours. BNP (last 3 results) No results for input(s): PROBNP in the last 8760 hours. HbA1C: No results for input(s): HGBA1C in the last 72 hours. CBG: Recent Labs  Lab 11/28/16 1155 11/30/16 0747 12/01/16 0800 12/02/16 0743  GLUCAP 182* 106* 84 96   Lipid Profile: No results for input(s): CHOL, HDL, LDLCALC, TRIG, CHOLHDL, LDLDIRECT in the last 72 hours. Thyroid Function Tests: No results for input(s): TSH, T4TOTAL, FREET4, T3FREE, THYROIDAB in the last 72 hours. Anemia Panel: Recent Labs    12/02/16 0512 12/02/16 1000  VITAMINB12  --  263  FOLATE  --  11.0  FERRITIN  --  519*  TIBC  --  197*  IRON  --  26*  RETICCTPCT 4.1*  --    Sepsis Labs: Recent Labs  Lab 11/30/16 0454 11/30/16 0740 12/01/16 0859  LATICACIDVEN 2.5* 2.3* 1.3  Recent Results (from the past 240 hour(s))  Culture, blood (routine x 2)     Status: Abnormal   Collection Time: 11/30/16  4:46 AM  Result Value Ref Range Status   Specimen Description BLOOD RIGHT ANTECUBITAL  Final   Special Requests IN PEDIATRIC BOTTLE Blood Culture adequate volume  Final   Culture  Setup Time   Final    GRAM POSITIVE COCCI IN CLUSTERS IN PEDIATRIC BOTTLE CRITICAL RESULT CALLED TO, READ BACK BY AND VERIFIED WITH: B GREEN PHARMD 0314 12/01/16 A BROWNING    Culture (A)  Final    STAPHYLOCOCCUS SPECIES (COAGULASE NEGATIVE) THE SIGNIFICANCE OF ISOLATING THIS ORGANISM FROM A SINGLE SET OF BLOOD CULTURES WHEN MULTIPLE SETS ARE DRAWN IS UNCERTAIN. PLEASE NOTIFY THE MICROBIOLOGY DEPARTMENT WITHIN ONE WEEK IF SPECIATION AND SENSITIVITIES ARE REQUIRED. Performed at [facility], [place] [address] [place], [place] [address]    Report Status 12/02/2016 FINAL  Final  Blood Culture ID Panel (Reflexed)     Status: Abnormal   Collection Time: 11/30/16  4:46 AM  Result Value Ref  Range Status   Enterococcus species NOT DETECTED NOT DETECTED Final   Listeria monocytogenes NOT DETECTED NOT DETECTED Final   Staphylococcus species DETECTED (A) NOT DETECTED Final    Comment: Methicillin (oxacillin) susceptible coagulase negative staphylococcus. Possible blood culture contaminant (unless isolated from more than one blood culture draw or clinical case suggests pathogenicity). No antibiotic treatment is indicated for blood  culture contaminants. CRITICAL RESULT CALLED TO, READ BACK BY AND VERIFIED WITH: B GREEN PHARMD 0314 12/01/16 A BROWNING    Staphylococcus aureus NOT DETECTED NOT DETECTED Final   Methicillin resistance NOT DETECTED NOT DETECTED Final   Streptococcus species NOT DETECTED NOT DETECTED Final   Streptococcus agalactiae NOT DETECTED NOT DETECTED Final   Streptococcus pneumoniae NOT DETECTED NOT DETECTED Final   Streptococcus pyogenes NOT DETECTED NOT DETECTED Final   Acinetobacter baumannii NOT DETECTED NOT DETECTED Final   Enterobacteriaceae species NOT DETECTED NOT DETECTED Final   Enterobacter cloacae complex NOT DETECTED NOT DETECTED Final   Escherichia coli NOT DETECTED NOT DETECTED Final   Klebsiella oxytoca NOT DETECTED NOT DETECTED Final   Klebsiella pneumoniae NOT DETECTED NOT DETECTED Final   Proteus species NOT DETECTED NOT DETECTED Final   Serratia marcescens NOT DETECTED NOT DETECTED Final   Haemophilus influenzae NOT DETECTED NOT DETECTED Final   Neisseria meningitidis NOT DETECTED NOT DETECTED Final   Pseudomonas aeruginosa NOT DETECTED NOT DETECTED Final   Candida albicans NOT DETECTED NOT DETECTED Final   Candida glabrata NOT DETECTED NOT DETECTED Final   Candida krusei NOT DETECTED NOT DETECTED Final   Candida parapsilosis NOT DETECTED NOT DETECTED Final   Candida tropicalis NOT DETECTED NOT DETECTED Final    Comment: Performed at [facility], [place] [address] [place], [place] [address]  Culture, blood (routine x 2)     Status:  None (Preliminary result)   Collection Time: 11/30/16  4:57 AM  Result Value Ref Range Status   Specimen Description BLOOD LEFT FOREARM  Final   Special Requests   Final    BOTTLES DRAWN AEROBIC AND ANAEROBIC Blood Culture adequate volume   Culture   Final    NO GROWTH 2 DAYS Performed at [facility], [place] [address] [place], [place] [address]    Report Status PENDING  Incomplete  Culture, Urine     Status: Abnormal   Collection Time: 11/30/16 10:59 AM  Result Value Ref Range  Status   Specimen Description URINE, RANDOM  Final   Special Requests NONE  Final   Culture MULTIPLE SPECIES PRESENT, SUGGEST RECOLLECTION (A)  Final   Report Status 12/02/2016 FINAL  Final         Radiology Studies: No results found.      Scheduled Meds: . amoxicillin-clavulanate  1 tablet Oral Q12H  . chlordiazePOXIDE  15 mg Oral BID  . folic acid  1 mg Oral Daily  . heparin subcutaneous  5,000 Units Subcutaneous Q8H  . LORazepam  0-4 mg Intravenous Q12H  . magnesium oxide  400 mg Oral Daily  . mouth rinse  15 mL Mouth Rinse BID  . multivitamin with minerals  1 tablet Oral Daily  . nicotine  21 mg Transdermal Daily  . thiamine  100 mg Oral Daily   Continuous Infusions:    LOS: 4 days    Time spent: 35 minutes    Irine Seal, MD [facility] Pager [phone]  If 7PM-7AM, please contact night-coverage [email or web address] Password [email or web address] 12/02/2016, 7:55 PM

## 2016-12-02 NOTE — Progress Notes (Signed)
OT Evaluation  PTA, pt lived alone and was independent with ADL and mobility. Pt currently requires min A with ADL and mobility @ RW level. Pt most likey to progress to DC home with intermittent S. Will follow acutely to facilitate safe DC home.    12/02/16 1600  OT Visit Information  Last OT Received On 12/16/16  Assistance Needed +1  History of Present Illness REXTON GREULICH is a 46 y.o. male with medical history significant of alcohol abuse, alcohol withdrawal seizure, tobacco abuse, hypertension, who presented with seizure.  Precautions  Precautions Fall  Restrictions  Weight Bearing Restrictions No  Home Living  Family/patient expects to be discharged to: Private residence  Living Arrangements Alone  Available Help at Discharge Available PRN/intermittently  Type of [place] to enter  Entrance Stairs-Number of Steps 4  Entrance Stairs-Rails [place] One level  Bathroom Shower/Tub Tub/shower unit;Curtain  Corporate treasurer Yes  How Accessible Accessible via walker  [place] None  Prior Function  Level of Independence Independent  Comments works as a Tax inspector No difficulties  Pain Assessment  Pain Assessment 0-10  Faces Pain Scale 2  Pain Location bil LEs  Pain Descriptors / Indicators Sore  Pain Intervention(s) Limited activity within patient's tolerance  Cognition  Arousal/Alertness Awake/alert  Behavior During Therapy WFL for tasks assessed/performed  Overall Cognitive Status (improving; will further assess)  Upper Extremity Assessment  Upper Extremity Assessment Overall WFL for tasks assessed  Lower Extremity Assessment  Lower Extremity Assessment Defer to PT evaluation  Cervical / Trunk Assessment  Cervical / Trunk Assessment Normal  ADL  Overall ADL's  Needs assistance/impaired  Grooming Min guard;Standing  Upper Body Bathing Set up;Sitting   Lower Body Bathing Min guard;Sit to/from stand  Upper Body Dressing  Set up;Sitting  Lower Body Dressing Min guard;Sit to/from Environmental education officer guard;RW;Ambulation  Toileting- Water quality scientist and Hygiene Supervision/safety  Functional mobility during ADLs Min guard;Rolling walker (unsteady and high fall risk without RW)  General ADL Comments May benefit from [place]- History  Baseline Vision/History Wears glasses  Bed Mobility  General bed mobility comments NT-in chair  Transfers  Overall transfer level Needs assistance  Transfers Sit to/from Stand  Sit to Stand Min guard  Balance  Overall balance assessment History of Falls;Needs assistance  Sitting balance-Leahy Scale Good  Standing balance support During functional activity;Bilateral upper extremity supported  Standing balance-Leahy Scale Poor  Standing balance comment reliant on UEs   OT - End of Session  Equipment Utilized During Treatment Gait belt;Rolling walker  Activity Tolerance Patient tolerated treatment well  Patient left in chair;with call bell/phone within reach;with chair alarm set;with family/visitor present  Nurse Communication Mobility status  OT Assessment  OT Recommendation/Assessment Patient needs continued OT Services  OT Visit Diagnosis Unsteadiness on feet (R26.81);Muscle weakness (generalized) (M62.81);History of falling (Z91.81);Pain;Other symptoms and signs involving cognitive function  Pain - part of body Hip  OT Problem List Decreased activity tolerance;Impaired balance (sitting and/or standing);Decreased safety awareness;Decreased cognition;Pain  OT Plan  OT Frequency (ACUTE ONLY) Min 2X/week  OT Treatment/Interventions (ACUTE ONLY) Self-care/ADL training;Therapeutic exercise;DME and/or AE instruction;Therapeutic activities;Balance training;Patient/family education;Cognitive remediation/compensation  AM-PAC OT "6 Clicks" Daily Activity Outcome Measure  Help from another person  eating meals? 4  Help from another person taking care of personal grooming? 3  Help from another person toileting, which includes using toliet, bedpan, or urinal? 3  Help from another person bathing (including washing, rinsing, drying)? 3  Help from another person to put on and taking off regular upper body clothing? 4  Help from another person to put on and taking off regular lower body clothing? 3  6 Click Score 20  ADL G Code Conversion CJ  OT Recommendation  Follow Up Recommendations Supervision - Intermittent  OT Equipment 3 in 1 bedside commode;Other (comment) (RW)  Individuals Consulted  Consulted and Agree with Results and Recommendations Patient  Acute Rehab OT Goals  Patient Stated Goal home soon  OT Goal Formulation With patient  Time For Goal Achievement 12/16/16  Potential to Achieve Goals Good  OT Time Calculation  OT Start Time (ACUTE ONLY) 1118  OT Stop Time (ACUTE ONLY) 1131  OT Time Calculation (min) 13 min  OT General Charges  $OT Visit 1 Visit  OT Evaluation  $OT Eval Low Complexity 1 Low  Written Expression  Dominant Hand Right  [facility], OT/L  [phone] 12/02/2016

## 2016-12-02 NOTE — Evaluation (Signed)
Physical Therapy Evaluation Patient Details Name: Eugene Jackson MRN: [id number] DOB: 1970/07/23 Today's Date: 12/02/2016   History of Present Illness  Eugene Jackson is a 46 y.o. male with medical history significant of alcohol abuse, alcohol withdrawal seizure, tobacco abuse, hypertension, who presented with seizure.  Clinical Impression  Pt admitted with above diagnosis. Pt currently with functional limitations due to the deficits listed below (see PT Problem List).  Pt will benefit from skilled PT to increase their independence and safety with mobility to allow discharge to the venue listed below.      Follow Up Recommendations No PT follow up    Equipment Recommendations  None recommended by PT    Recommendations for Other Services       Precautions / Restrictions Precautions Precautions: Fall Restrictions Weight Bearing Restrictions: No      Mobility  Bed Mobility               General bed mobility comments: NT-in chair  Transfers Overall transfer level: Needs assistance Equipment used: Rolling walker (2 wheeled) Transfers: Sit to/from Stand           General transfer comment: sit to stand x2 d/t LOB on initial attempt, cues for hand placement and safety  Ambulation/Gait Ambulation/Gait assistance: Min guard Ambulation Distance (Feet): 220 Feet Assistive device: Rolling walker (2 wheeled) Gait Pattern/deviations: Step-through pattern;Decreased stride length;Trunk flexed     General Gait Details: cues for RW safety, LOB x1 with min assist to recover  Stairs            Wheelchair Mobility    Modified Rankin (Stroke Patients Only)       Balance Overall balance assessment: History of Falls;Needs assistance   Sitting balance-Leahy Scale: Good     Standing balance support: During functional activity;Bilateral upper extremity supported Standing balance-Leahy Scale: Poor Standing balance comment: reliant on UEs                              Pertinent Vitals/Pain Pain Assessment: Faces Faces Pain Scale: Hurts a little bit Pain Location: bil LEs Pain Descriptors / Indicators: Sore Pain Intervention(s): Monitored during session    Home Living Family/patient expects to be discharged to:: Private residence Living Arrangements: Alone   Type of Home: Mobile home Home Access: Stairs to enter   Technical brewer of Steps: 4 Home Layout: One level Home Equipment: Environmental consultant - 2 wheels      Prior Function Level of Independence: Independent         Comments: works as a Comptroller        Extremity/Trunk Assessment   Upper Extremity Assessment Upper Extremity Assessment: Overall WFL for tasks assessed    Lower Extremity Assessment Lower Extremity Assessment: Overall WFL for tasks assessed       Communication   Communication: No difficulties  Cognition Arousal/Alertness: Awake/alert Behavior During Therapy: WFL for tasks assessed/performed Overall Cognitive Status: Within Functional Limits for tasks assessed                                 General Comments: confused on admission, seems to be clearing mentally, A&O x3      General Comments      Exercises     Assessment/Plan    PT Assessment Patient needs continued PT services  PT Problem List Decreased mobility;Decreased activity  tolerance;Decreased balance       PT Treatment Interventions DME instruction;Therapeutic activities;Gait training;Functional mobility training;Therapeutic exercise;Patient/family education    PT Goals (Current goals can be found in the Care Plan section)  Acute Rehab PT Goals Patient Stated Goal: home soon PT Goal Formulation: With patient Potential to Achieve Goals: Good    Frequency Min 3X/week   Barriers to discharge        Co-evaluation               AM-PAC PT "6 Clicks" Daily Activity  Outcome Measure Difficulty turning over in bed (including  adjusting bedclothes, sheets and blankets)?: A Little Difficulty moving from lying on back to sitting on the side of the bed? : A Little Difficulty sitting down on and standing up from a chair with arms (e.g., wheelchair, bedside commode, etc,.)?: Unable Help needed moving to and from a bed to chair (including a wheelchair)?: A Little Help needed walking in hospital room?: A Little Help needed climbing 3-5 steps with a railing? : A Little 6 Click Score: 16    End of Session Equipment Utilized During Treatment: Gait belt Activity Tolerance: Patient tolerated treatment well Patient left: in chair;with call bell/phone within reach;with chair alarm set   PT Visit Diagnosis: Other abnormalities of gait and mobility (R26.89)    Time: 3013-1438 PT Time Calculation (min) (ACUTE ONLY): 20 min   Charges:   PT Evaluation $PT Eval Low Complexity: 1 Low     PT G Codes:          Cotina Freedman Dec 09, 2016, 10:03 AM

## 2016-12-02 NOTE — Progress Notes (Signed)
Received report from previou sRN. Pts assessment is unchanged from this mornings assessment. Will continue to monitor.

## 2016-12-03 DIAGNOSIS — F10231 Alcohol dependence with withdrawal delirium: Principal | ICD-10-CM

## 2016-12-03 DIAGNOSIS — E538 Deficiency of other specified B group vitamins: Secondary | ICD-10-CM | POA: Diagnosis present

## 2016-12-03 DIAGNOSIS — R945 Abnormal results of liver function studies: Secondary | ICD-10-CM

## 2016-12-03 DIAGNOSIS — D539 Nutritional anemia, unspecified: Secondary | ICD-10-CM

## 2016-12-03 DIAGNOSIS — E876 Hypokalemia: Secondary | ICD-10-CM

## 2016-12-03 DIAGNOSIS — I10 Essential (primary) hypertension: Secondary | ICD-10-CM

## 2016-12-03 DIAGNOSIS — D696 Thrombocytopenia, unspecified: Secondary | ICD-10-CM

## 2016-12-03 LAB — CBC
HCT: 27.7 % — ABNORMAL LOW (ref 39.0–52.0)
Hemoglobin: 9.3 g/dL — ABNORMAL LOW (ref 13.0–17.0)
MCH: 39.2 pg — ABNORMAL HIGH (ref 26.0–34.0)
MCHC: 33.6 g/dL (ref 30.0–36.0)
MCV: 116.9 fL — ABNORMAL HIGH (ref 78.0–100.0)
Platelets: 189 10*3/uL (ref 150–400)
RBC: 2.37 MIL/uL — ABNORMAL LOW (ref 4.22–5.81)
RDW: 13.3 % (ref 11.5–15.5)
WBC: 6.1 10*3/uL (ref 4.0–10.5)

## 2016-12-03 LAB — BASIC METABOLIC PANEL
Anion gap: 7 (ref 5–15)
BUN: 5 mg/dL — AB (ref 6–20)
CALCIUM: 8.3 mg/dL — AB (ref 8.9–10.3)
CO2: 21 mmol/L — ABNORMAL LOW (ref 22–32)
CREATININE: 0.56 mg/dL — AB (ref 0.61–1.24)
Chloride: 107 mmol/L (ref 101–111)
GFR calc Af Amer: >60 mL/min (ref >60)
GLUCOSE: 92 mg/dL (ref 65–99)
Potassium: 4.1 mmol/L (ref 3.5–5.1)
SODIUM: 135 mmol/L (ref 135–145)

## 2016-12-03 LAB — MAGNESIUM: MAGNESIUM: 1.5 mg/dL — AB (ref 1.7–2.4)

## 2016-12-03 LAB — GLUCOSE, CAPILLARY: Glucose-Capillary: 95 mg/dL (ref 65–99)

## 2016-12-03 MED ORDER — THIAMINE HCL 100 MG PO TABS: 100.0000 mg | ORAL_TABLET | Freq: Every day | ORAL | Status: DC

## 2016-12-03 MED ORDER — CYANOCOBALAMIN 1000 MCG PO TABS: 1000.0000 ug | ORAL_TABLET | Freq: Every day | ORAL | 0 refills | Status: DC

## 2016-12-03 MED ORDER — MAGNESIUM OXIDE 400 (241.3 MG) MG PO TABS: 400.0000 mg | ORAL_TABLET | Freq: Two times a day (BID) | ORAL | 0 refills | Status: DC

## 2016-12-03 MED ORDER — MAGNESIUM SULFATE 4 GM/100ML IV SOLN
4.0000 g | Freq: Once | INTRAVENOUS | Status: AC
  Administered 2016-12-03: 4 g via INTRAVENOUS
  Filled 2016-12-03: qty 100

## 2016-12-03 MED ORDER — CYANOCOBALAMIN 1000 MCG/ML IJ SOLN
1000.0000 ug | Freq: Once | INTRAMUSCULAR | Status: AC
  Administered 2016-12-03: 1000 ug via INTRAMUSCULAR
  Filled 2016-12-03: qty 1

## 2016-12-03 MED ORDER — AMOXICILLIN-POT CLAVULANATE 875-125 MG PO TABS: 1.0000 | ORAL_TABLET | Freq: Two times a day (BID) | ORAL | 0 refills | Status: AC

## 2016-12-03 MED ORDER — VITAMIN B-12 1000 MCG PO TABS: 1000.0000 ug | ORAL_TABLET | Freq: Every day | ORAL | Status: DC

## 2016-12-03 MED ORDER — FOLIC ACID 1 MG PO TABS: 1.0000 mg | ORAL_TABLET | Freq: Every day | ORAL | Status: DC

## 2016-12-03 MED ORDER — MAGNESIUM OXIDE 400 (241.3 MG) MG PO TABS: 400.0000 mg | ORAL_TABLET | Freq: Two times a day (BID) | ORAL | Status: DC

## 2016-12-03 MED ORDER — NICOTINE 21 MG/24HR TD PT24: 21.0000 mg | MEDICATED_PATCH | Freq: Every day | TRANSDERMAL | 0 refills | Status: DC

## 2016-12-03 MED ORDER — HYDROXYZINE HCL 10 MG PO TABS: 10.0000 mg | ORAL_TABLET | Freq: Three times a day (TID) | ORAL | 0 refills | Status: DC | PRN

## 2016-12-03 NOTE — Clinical Social Work Note (Signed)
Clinical Social Work Assessment  Patient Details  Name: Eugene Jackson MRN: [id number] Date of Birth: [birth year]  Date of referral:  12/03/16               Reason for consult:  Substance Use/ETOH Abuse                Permission sought to share information with:    Permission granted to share information::     Name::        Agency::     Relationship::     Contact Information:     Housing/Transportation Living arrangements for the past 2 months:  Single Family Home Source of Information:  Patient Patient Interpreter Needed:  None Criminal Activity/Legal Involvement Pertinent to Current Situation/Hospitalization:  No - Comment as needed Significant Relationships:  Adult Children, Parents Lives with:  Self Do you feel safe going back to the place where you live?  Yes Need for family participation in patient care:  No (Coment)  Care giving concerns:  Patient reported none. Patient from home alone and employed full time. CSW consulted for patient's etoh use, patient reports alcohol use and reports minimal impact from alcohol use.    Social Worker assessment / plan:  CSW spoke with patient at bedside regarding consult for etoh use. Patient accompanied by daughter and father, patient verbalized permission for both to stay in room during assessment. CSW and patient discussed patient's alcohol use, patient reported that he has done it so Shelia Kingsberry it's just something that he does. CSW inquired about patient's previous treatment history and his current interest in treatment. Patient reported that he has been at residential treatment before and that he does not want to go back. Patient reported that he was at [facility] and that after treatment he remained sober for 3 weeks. Patient reported that he has been to [facility] in the past and it was okay. CSW inquired about how patient felt while he was sober, patient reported that he felt different in a good way. CSW inquired about patient's current  motivation for treatment. Patient reported none, CSW collaborated with patient discuss protective factors and how his drinking impacts his protective factors. Patient reported that his job and daughter are protective factors and reported minimal impacts through his drinking for both. CSW provided patient with outpatient SA resources and encouraged patient to follow up.  Patient declined residential SA treatment and accepted outpatient SA resources. CSW signing off, no other needs identified at this time.   Employment status:  Therapist, music:  Managed Care PT Recommendations:  No Follow Up Information / Referral to community resources:  Residential Substance Abuse Treatment Options, Outpatient Substance Abuse Treatment Options  Patient/Family's Response to care:  Patient accepted outpatient SA resources but didn't verbalize plan to follow up. Patient initially hesitant about speaking to CSW about etoh use then started speaking with CSW and remained engaged throughout assessment.   Patient/Family's Understanding of and Emotional Response to Diagnosis, Current Treatment, and Prognosis:  Patient presented calm and became more and more engaged throughout assessment. Patient verbalize little insight into his drinking impacting his health and other aspects of his life. Patient appeared to be in pre-contemplation stage and accepted resources but verbalized no plan to follow up. CSW provided active listening and encouraged patient to follow up with resources and to stay engaged with support system.   Emotional Assessment Appearance:  Appears stated age Attitude/Demeanor/Rapport:  Other(Open) Affect (typically observed):  Calm Orientation:  Oriented to Self,  Oriented to Place, Oriented to  Time, Oriented to Situation Alcohol / Substance use:  Alcohol Use Psych involvement (Current and /or in the community):  No (Comment)  Discharge Needs  Concerns to be addressed:  No discharge needs  identified Readmission within the last 30 days:  No Current discharge risk:  None Barriers to Discharge:  Continued Medical Work up   [employer], LCSW 12/03/2016, 1:04 PM

## 2016-12-03 NOTE — Discharge Instructions (Signed)
You were seen in the emergency department today following a possible seizure.  Your head CT did not show any new abnormalities.  Your lab work showed that your magnesium, potassium, sodium, and chloride were all low.  These were replaced in the emergency department.  You will need to have repeat labs done within 3-5 days to re check these lab values. It is important that you stay well hydrated and try to decrease your alcohol intake slowly.  Be sure to eat a well balanced diet with plenty of fruits and vegetables. These factors are contributing to your lab abnormalities.   Follow up with your primary care provider. Return to the emergency department for any new/worsening symptoms including but not limited to a seizure, passing out, chest pain, difficulty breathing, or feeling as though your heart is racing or beating differently.      Hypomagnesemia Hypomagnesemia is a condition in which the level of magnesium in the blood is low. Magnesium is a mineral that is found in many foods. It is used in many different processes in the body. Hypomagnesemia can affect every organ in the body. It can cause life-threatening problems. What are the causes? Causes of hypomagnesemia include:  Not getting enough magnesium in your diet.  Malnutrition.  Problems with absorbing magnesium from the intestines.  Dehydration.  Alcohol abuse.  Vomiting.  Severe diarrhea.  Some medicines, including medicines that make you urinate more.  Certain diseases, such as kidney disease, diabetes, and overactive thyroid.  What are the signs or symptoms?  Involuntary shaking or trembling of a body part (tremor).  Confusion.  Muscle weakness.  Sensitivity to light, sound, and touch.  Psychiatric issues, such as depression, irritability, or psychosis.  Sudden tightening of muscles (muscle spasms).  Tingling in the arms and legs.  A feeling of fluttering of the heart. These symptoms are more severe if  magnesium levels drop suddenly. How is this diagnosed? To make a diagnosis, your health care provider will do a physical exam and order blood and urine tests. How is this treated? Treatment will depend on the cause and the severity of your condition. It may involve:  A magnesium supplement. This can be taken in pill form. It can also be given through an IV tube. This is usually done if the condition is severe.  Changes to your diet. You may be directed to eat foods that have a lot of magnesium, such as green leafy vegetables, peas, beans, and nuts.  Eliminating alcohol from your diet.  Follow these instructions at home:  Include foods with magnesium in your diet. Foods that are rich in magnesium include green vegetables, beans, nuts and seeds, and whole grains.  Take medicines only as directed by your health care provider.  Take magnesium supplements if your health care provider instructs you to do that. Take them as directed.  Have your magnesium levels monitored as directed by your health care provider.  When you are active, drink fluids that contain electrolytes.  Keep all follow-up visits as directed by your health care provider. This is important. Contact a health care provider if:  You get worse instead of better.  Your symptoms return. Get help right away if:  Your symptoms are severe. This information is not intended to replace advice given to you by your health care provider. Make sure you discuss any questions you have with your health care provider. Document Released: 09/15/2004 Document Revised: 05/28/2015 Document Reviewed: 08/05/2013 Elsevier Interactive Patient Education  2018 [employer].  Hypokalemia Hypokalemia means that the amount of potassium in the blood is lower than normal.Potassium is a chemical that helps regulate the amount of fluid in the body (electrolyte). It also stimulates muscle tightening (contraction) and helps nerves work  properly.Normally, most of the bodys potassium is inside of cells, and only a very small amount is in the blood. Because the amount in the blood is so small, minor changes to potassium levels in the blood can be life-threatening. What are the causes? This condition may be caused by:  Antibiotic medicine.  Diarrhea or vomiting. Taking too much of a medicine that helps you have a bowel movement (laxative) can cause diarrhea and lead to hypokalemia.  Chronic kidney disease (CKD).  Medicines that help the body get rid of excess fluid (diuretics).  Eating disorders, such as bulimia.  Low magnesium levels in the body.  Sweating a lot.  What are the signs or symptoms? Symptoms of this condition include:  Weakness.  Constipation.  Fatigue.  Muscle cramps.  Mental confusion.  Skipped heartbeats or irregular heartbeat (palpitations).  Tingling or numbness.  How is this diagnosed? This condition is diagnosed with a blood test. How is this treated? Hypokalemia can be treated by taking potassium supplements by mouth or adjusting the medicines that you take. Treatment may also include eating more foods that contain a lot of potassium. If your potassium level is very low, you may need to get potassium through an IV tube in one of your veins and be monitored in the hospital. Follow these instructions at home:  Take over-the-counter and prescription medicines only as told by your health care provider. This includes vitamins and supplements.  Eat a healthy diet. A healthy diet includes fresh fruits and vegetables, whole grains, healthy fats, and lean proteins.  If instructed, eat more foods that contain a lot of potassium, such as: ? Nuts, such as peanuts and pistachios. ? Seeds, such as sunflower seeds and pumpkin seeds. ? Peas, lentils, and lima beans. ? Whole grain and bran cereals and breads. ? Fresh fruits and vegetables, such as apricots, avocado, bananas, cantaloupe, kiwi,  oranges, tomatoes, asparagus, and potatoes. ? Orange juice. ? Tomato juice. ? Red meats. ? Yogurt.  Keep all follow-up visits as told by your health care provider. This is important. Contact a health care provider if:  You have weakness that gets worse.  You feel your heart pounding or racing.  You vomit.  You have diarrhea.  You have diabetes (diabetes mellitus) and you have trouble keeping your blood sugar (glucose) in your target range. Get help right away if:  You have chest pain.  You have shortness of breath.  You have vomiting or diarrhea that lasts for more than 2 days.  You faint. This information is not intended to replace advice given to you by your health care provider. Make sure you discuss any questions you have with your health care provider. Document Released: 12/20/2004 Document Revised: 08/08/2015 Document Reviewed: 08/08/2015 Elsevier Interactive Patient Education  2018 [employer].

## 2016-12-03 NOTE — Discharge Summary (Signed)
Physician Discharge Summary  Eugene Jackson MPN:[id number] DOB: [birth year] DOA: 11/28/2016  PCP: [name], MD  Admit date: 11/28/2016 Discharge date: 12/03/2016  Time spent: 60 minutes  Recommendations for Outpatient Follow-up:  1. Follow up with [name], MD 1-2 weeks.  On follow-up patient's vitamin B12 levels will need to be checked as patient was noted to have low vitamin B12 level during the hospitalization and discharged on supplementation.  Patient's blood pressure need to be reassessed as patient's antihypertensive medications were discontinued due to borderline blood pressures.  Patient will need a basic metabolic profile checked to follow-up on electrolytes renal function.  Patient also need a magnesium level checked. 2.    Discharge Diagnoses:  Principal Problem:   Alcohol withdrawal seizure ([place]) Active Problems:   Alcohol abuse   Hypokalemia   Thrombocytopenia ([facility])   Abnormal liver function tests   Smoker   Hypomagnesemia   Essential hypertension   Acute lower UTI: Probable    PNA (pneumonia): Probable   Fever   Anemia   Low vitamin B12 level   Macrocytic anemia   Discharge Condition: stable and improved  Diet recommendation: Regular  Filed Weights   11/29/16 0105  Weight: 65.1 kg (143 lb 8.3 oz)    History of present illness:  Per Dr Alesia Morin is a 46 y.o. male with medical history significant of alcohol abuse, alcohol withdrawal seizure, tobacco abuse, hypertension, who presented with seizure.  Per his daughter, pt had an unwitnessed seizure and found on the ground at about 10:20 AM. Unsure how long he was down. Pt was noted to have left sided posterior hematoma to the head, right elbow abrasion, and left forearm laceration. When he was seen in ED, he was oriented 3. He was tremulous, but no active seizure. Patient denied unilateral weakness, numbness or tingliness extremities. No facial droop, hearing loss, vision  change. Patient denied chest pain. He stated that he has cough with thick mucus production, but no shortness of breath, fever or chills. Denied nausea, vomiting, diarrhea or abdominal pain. No symptoms of UTI. Hereported that his last drink was last night between 19:00 and 20:00. He had withdrawal seizure previously, last one was 3 years ago.He typically drinks 1 gallon of vodka per week.  ED Course: pt was found to have WBC 7.0, potassium 3.3, Tylenol level less than 10, negative urinalysis, potassium 3.3, magnesium level 0.9, kidney functioning okay, tachycardia, tachypnea, oxygen saturation 88-99% on room air. CT head is negative for acute intracranial abnormalities, but showed left superior scalp soft tissue injury. Patient was admitted to telemetry bed as inpatient    Hospital Course:  #1 alcohol withdrawal seizure Patient with significant alcohol intake and told Dr. Candiss Norse. that his last drink was on the Friday prior to admission and noted to be in DTs. Patient was placed on a Librium withdrawal protocol as well as the Ativan withdrawal protocol and monitored.  Patient improved slowly during the hospitalization.  Patient's tremors improved and had resolved by day of discharge.  Patient improved clinically became more alert communicative and was ambulating in the hallway but with good oxygen saturations.  CT head negative for any acute abnormalities.  Patient's diet was advanced and patient tolerated a regular diet by day of discharge.  She was hydrated with IV fluids, placed on supportive care, thiamine, folic acid, multivitamin.  Patient had no seizures during the hospitalization and patient was discharged in stable and improved condition.    #2 fever  Questionable etiology. Repeat chest x-ray done concerning for possible infiltrate noted in the right base. Urinalysis is also done leukocytes negative, positive nitrites, 6-30 WBCs. Urine strep pneumococcus antigen negative. Urine Legionella  antigen negative.1/2 blood cultures with coagulase-negative staph likely contaminant.  Urine cultures with multiple species.  She was started empirically on IV Unasyn.  Fever curve trended down.  Patient improved clinically.  Patient was subsequently transitioned from IV Unasyn to oral Augmentin which he tolerated.  Patient was discharged home on 4 more days of oral Augmentin to complete a course of antibiotic treatment.  #3 probable pneumonia Per chest x-ray. Patient with history of significant alcohol abuse currently in alcohol withdrawal. Patient noted to have a fever. Urine strep pneumococcus antigen negative. Urine Legionella antigen negative. 1/2 Blood cultures with coagulase-negative staph likely contaminant.    Was initially placed empirically on IV Unasyn.  As patient improved clinically patient was transitioned to oral Augmentin and patient was discharged home on 4 more days of oral Augmentin to complete a course of antibiotic treatment.  Outpatient follow-up.   #4  Bacteria in urine Urine culture is with multiple species present.    #5 hypomagnesemia/hypokalemia She is noted to be hypomagnesemic and hypokalemic during the hospitalization.  Patient's electrolytes were repleted.  Patient be discharged home on oral magnesium oxide.    #6 hypertension Pressure borderline.  Discontinued metoprolol and Norvasc.  Blood pressure improved.  Outpatient follow-up.  #7 tobacco abuse Tobacco cessation.  Patient was placed on nicotine patch.  #8 thrombocytopenia. Likely secondary to history of alcohol abuse. No bleeding.  Improved during the hospitalization.  #9 transaminitis Likely secondary to chronic alcohol abuse.  Outpatient follow-up.  #10 1/2 blood cultures with coagulase negative staph Likely contaminant.  #11 low vitamin B12 levels Patient was noted to have low vitamin B12 levels during the hospitalization.  Patient was discharged on vitamin B12 supplementation.  Outpatient  follow-up.     Procedures:  CT head 11/28/2016.  Chest x-ray 11/29/2078, 11/30/2016      Consultations:  None  Discharge Exam: Vitals:   12/02/16 2140 12/03/16 0438  BP: 99/70 105/77  Pulse: 95 91  Resp: 18 18  Temp: 98.2 F (36.8 C) 99.2 F (37.3 C)  SpO2: 100% 100%    General: NAD Cardiovascular: RRR Respiratory: CTAB  Discharge Instructions   Discharge Instructions    Diet general   Complete by:  As directed    Increase activity slowly   Complete by:  As directed      Allergies as of 12/03/2016   No Known Allergies     Medication List    STOP taking these medications   amLODipine 10 MG tablet Commonly known as:  NORVASC     TAKE these medications   amoxicillin-clavulanate 875-125 MG tablet Commonly known as:  AUGMENTIN Take 1 tablet by mouth every 12 (twelve) hours for 4 days.   cyanocobalamin 1000 MCG tablet Take 1 tablet (1,000 mcg total) by mouth daily. Start taking on:  94/08/5460   folic acid 1 MG tablet Commonly known as:  FOLVITE Take 1 tablet (1 mg total) by mouth daily. Start taking on:  12/04/2016   hydrOXYzine 10 MG tablet Commonly known as:  ATARAX/VISTARIL Take 1 tablet (10 mg total) by mouth 3 (three) times daily as needed for nausea.   magnesium oxide 400 (241.3 Mg) MG tablet Commonly known as:  MAG-OX Take 1 tablet (400 mg total) by mouth 2 (two) times daily.   multivitamin with minerals Tabs  tablet Take 1 tablet by mouth daily.   nicotine 21 mg/24hr patch Commonly known as:  NICODERM CQ - dosed in mg/24 hours Place 1 patch (21 mg total) onto the skin daily. Start taking on:  12/04/2016   thiamine 100 MG tablet Take 1 tablet (100 mg total) by mouth daily. Start taking on:  12/04/2016      No Known Allergies Follow-up Information    Slatosky, Marshall Cork., MD. Schedule an appointment as soon as possible for a visit in 1 week(s).   Specialty:  Family Medicine Why:  f/u in 1-2 weeks. Contact information: [address]  [place] [address] [phone]            The results of significant diagnostics from this hospitalization (including imaging, microbiology, ancillary and laboratory) are listed below for reference.    Significant Diagnostic Studies: Ct Head Wo Contrast  Result Date: 11/28/2016 CLINICAL DATA:  46 y/o male found down. Un-witnessed seizure. Postictal on EMS arrival. EXAM: CT HEAD WITHOUT CONTRAST TECHNIQUE: Contiguous axial images were obtained from the base of the skull through the vertex without intravenous contrast. COMPARISON:  Head CT 09/04/2012 and earlier. FINDINGS: Brain: Stable cerebral volume. No midline shift, ventriculomegaly, mass effect, evidence of mass lesion, intracranial hemorrhage or evidence of cortically based acute infarction. Gray-white matter differentiation is within normal limits throughout the brain. Vascular: Calcified atherosclerosis at the skull base. No suspicious intracranial vascular hyperdensity. Skull: Stable.  No skull fracture identified. Sinuses/Orbits: Chronic but increased partially visible right maxillary sinus opacification. Improved right sphenoid sinus aeration. Other visible paranasal sinuses and mastoids are stable and well pneumatized. Other: Left posterosuperior scalp hematoma or contusion measuring up to 8 mm in thickness. Otherwise negative Visualized orbits and scalp soft tissues are within normal limits. IMPRESSION: 1. Left superior scalp soft tissue injury without underlying fracture. 2. No acute intracranial abnormality. Chronic brain volume loss but stable and otherwise negative noncontrast CT appearance of the brain. Electronically Signed   By: Genevie Ann M.D.   On: 11/28/2016 13:27   Dg Chest Port 1 View  Result Date: 11/30/2016 CLINICAL DATA:  Fever.  Confusion. EXAM: PORTABLE CHEST 1 VIEW COMPARISON:  Radiographs 02/29/2016, more remote exam 11/26/2012 FINDINGS: Developing patchy bibasilar airspace disease. Left lung base  scarring again seen. Normal heart size and mediastinal contours. Prominent central pulmonary vasculature without pulmonary edema. No pneumothorax or large pleural effusion. IMPRESSION: Developing patchy bibasilar airspace disease, atelectasis, pneumonia, or aspiration. Prominence of central pulmonary vasculature without overt edema. Left lung base scarring again seen. Electronically Signed   By: Jeb Levering M.D.   On: 11/30/2016 04:48   Dg Chest Port 1 View  Result Date: 11/28/2016 CLINICAL DATA:  Seizure and cough EXAM: PORTABLE CHEST 1 VIEW COMPARISON:  11/26/2012 FINDINGS: Mild left pleural and parenchymal scarring at the left lung base. Right lung is clear. No acute consolidation. Normal cardiomediastinal silhouette with atherosclerosis. No pneumothorax. IMPRESSION: Left basilar pleural and parenchymal scarring. No acute infiltrate or edema. Electronically Signed   By: Donavan Foil M.D.   On: 11/28/2016 22:51    Microbiology: Recent Results (from the past 240 hour(s))  Culture, blood (routine x 2)     Status: Abnormal   Collection Time: 11/30/16  4:46 AM  Result Value Ref Range Status   Specimen Description BLOOD RIGHT ANTECUBITAL  Final   Special Requests IN PEDIATRIC BOTTLE Blood Culture adequate volume  Final   Culture  Setup Time   Final    GRAM POSITIVE  COCCI IN CLUSTERS IN PEDIATRIC BOTTLE CRITICAL RESULT CALLED TO, READ BACK BY AND VERIFIED WITH: B GREEN PHARMD 0314 12/01/16 A BROWNING    Culture (A)  Final    STAPHYLOCOCCUS SPECIES (COAGULASE NEGATIVE) THE SIGNIFICANCE OF ISOLATING THIS ORGANISM FROM A SINGLE SET OF BLOOD CULTURES WHEN MULTIPLE SETS ARE DRAWN IS UNCERTAIN. PLEASE NOTIFY THE MICROBIOLOGY DEPARTMENT WITHIN ONE WEEK IF SPECIATION AND SENSITIVITIES ARE REQUIRED. Performed at [facility], [place] [address] [place], [place] [address]    Report Status 12/02/2016 FINAL  Final  Blood Culture ID Panel (Reflexed)     Status: Abnormal   Collection Time: 11/30/16   4:46 AM  Result Value Ref Range Status   Enterococcus species NOT DETECTED NOT DETECTED Final   Listeria monocytogenes NOT DETECTED NOT DETECTED Final   Staphylococcus species DETECTED (A) NOT DETECTED Final    Comment: Methicillin (oxacillin) susceptible coagulase negative staphylococcus. Possible blood culture contaminant (unless isolated from more than one blood culture draw or clinical case suggests pathogenicity). No antibiotic treatment is indicated for blood  culture contaminants. CRITICAL RESULT CALLED TO, READ BACK BY AND VERIFIED WITH: B GREEN PHARMD 0314 12/01/16 A BROWNING    Staphylococcus aureus NOT DETECTED NOT DETECTED Final   Methicillin resistance NOT DETECTED NOT DETECTED Final   Streptococcus species NOT DETECTED NOT DETECTED Final   Streptococcus agalactiae NOT DETECTED NOT DETECTED Final   Streptococcus pneumoniae NOT DETECTED NOT DETECTED Final   Streptococcus pyogenes NOT DETECTED NOT DETECTED Final   Acinetobacter baumannii NOT DETECTED NOT DETECTED Final   Enterobacteriaceae species NOT DETECTED NOT DETECTED Final   Enterobacter cloacae complex NOT DETECTED NOT DETECTED Final   Escherichia coli NOT DETECTED NOT DETECTED Final   Klebsiella oxytoca NOT DETECTED NOT DETECTED Final   Klebsiella pneumoniae NOT DETECTED NOT DETECTED Final   Proteus species NOT DETECTED NOT DETECTED Final   Serratia marcescens NOT DETECTED NOT DETECTED Final   Haemophilus influenzae NOT DETECTED NOT DETECTED Final   Neisseria meningitidis NOT DETECTED NOT DETECTED Final   Pseudomonas aeruginosa NOT DETECTED NOT DETECTED Final   Candida albicans NOT DETECTED NOT DETECTED Final   Candida glabrata NOT DETECTED NOT DETECTED Final   Candida krusei NOT DETECTED NOT DETECTED Final   Candida parapsilosis NOT DETECTED NOT DETECTED Final   Candida tropicalis NOT DETECTED NOT DETECTED Final    Comment: Performed at [facility], [place] [address], [place], [place] [address]  Culture,  blood (routine x 2)     Status: None (Preliminary result)   Collection Time: 11/30/16  4:57 AM  Result Value Ref Range Status   Specimen Description BLOOD LEFT FOREARM  Final   Special Requests   Final    BOTTLES DRAWN AEROBIC AND ANAEROBIC Blood Culture adequate volume   Culture   Final    NO GROWTH 3 DAYS Performed at [facility], [place] [address] [place], [place] [address]    Report Status PENDING  Incomplete  Culture, Urine     Status: Abnormal   Collection Time: 11/30/16 10:59 AM  Result Value Ref Range Status   Specimen Description URINE, RANDOM  Final   Special Requests NONE  Final   Culture MULTIPLE SPECIES PRESENT, SUGGEST RECOLLECTION (A)  Final   Report Status 12/02/2016 FINAL  Final     Labs: Basic Metabolic Panel: Recent Labs  Lab 11/29/16 0125 11/29/16 0230 11/30/16 0457 12/01/16 0456 12/02/16 0512 12/03/16 0845  NA 133*  --  140 143 138 135  K 2.7*  --  3.2* 3.2* 2.9* 4.1  CL 94*  --  108 112* 106 107  CO2 20*  --  22 23 25  21*  GLUCOSE 128*  --  124* 96 112* 92  BUN <5*  --  8 11 9  5*  CREATININE 0.63  --  0.75 0.64 0.57* 0.56*  CALCIUM 7.8*  --  7.8* 7.5* 7.6* 8.3*  MG  --  1.7 1.6* 2.2 2.1 1.5*   Liver Function Tests: Recent Labs  Lab 11/28/16 1230 11/29/16 0125 11/30/16 0457 12/02/16 0512  AST 104* 92* 98* 57*  ALT 27 23 23 20   ALKPHOS 108 93 81 74  BILITOT 2.6* 2.3* 2.4* 1.5*  PROT 7.0 6.7 6.2* 5.9*  ALBUMIN 3.8 3.7 3.5 2.9*   No results for input(s): LIPASE, AMYLASE in the last 168 hours. No results for input(s): AMMONIA in the last 168 hours. CBC: Recent Labs  Lab 11/28/16 1230 11/29/16 0125 11/30/16 0457 12/01/16 0456 12/02/16 0512 12/03/16 0845  WBC 7.0 11.6* 9.5 7.5 6.8 6.1  NEUTROABS 5.9  --  8.0*  --  3.7  --   HGB 11.4* 11.8* 10.6* 9.3* 9.1* 9.3*  HCT 31.6* 32.9* 30.9* 27.5* 27.0* 27.7*  MCV 110.5* 111.9* 115.3* 118.0* 117.4* 116.9*  PLT 130* 96* 82* 99* 114* 189   Cardiac Enzymes: No results for input(s):  CKTOTAL, CKMB, CKMBINDEX, TROPONINI in the last 168 hours. BNP: BNP (last 3 results) Recent Labs    11/29/16 0125  BNP 912.2*    ProBNP (last 3 results) No results for input(s): PROBNP in the last 8760 hours.  CBG: Recent Labs  Lab 11/28/16 1155 11/30/16 0747 12/01/16 0800 12/02/16 0743 12/03/16 0745  GLUCAP 182* 106* 84 96 95       Signed:  Irine Seal MD.  [facility] 12/03/2016, 6:54 PM

## 2016-12-03 NOTE — Progress Notes (Signed)
  Speech Language Pathology Treatment: Dysphagia  Patient Details Name: Eugene Jackson MRN: [id number] DOB: Feb 29, 1970 Today's Date: 12/03/2016 Time: 2897-9150 SLP Time Calculation (min) (ACUTE ONLY): 9 min  Assessment / Plan / Recommendation Clinical Impression  Pt with resolution of dysphagia which was due to his mental status change/seizures.  Today pt fully alert, sitting upright in bed and going home.  Voice is strong and clear. He does admit to occasional issues with reflux.  Per review of chart, he is not on a PPI.  Advised him to monitor reflux symptoms and contact his PCP if worsens.   Provided him with reflux precautions to decrease reflux occurrence.  Pt denies premorbid dysphagia.  3 ounce Yale water test passed easily by pt.   No SlP follow up as SlP provided pt and father with information on heimlich maneuver, reflux precautions.  Thanks for allowing me to help with this pt's care.    HPI HPI: 46 yo male adm to [facility] with alcohol withdrawl induced seizure, PMH + for ETOH use/abuse, tremors.  Pt is dyarthric per chart review. Pt also had nystagmus after IV ativan given 11/29/16 per note.  Swallow evaluation ordered.       SLP Plan  All goals met       Recommendations  Diet recommendations: Regular;Thin liquid Liquids provided via: Cup Medication Administration: Whole meds with liquid Compensations: Minimize environmental distractions;Slow rate;Small sips/bites Postural Changes and/or Swallow Maneuvers: Seated upright 90 degrees;Upright 30-60 min after meal(reflux/esophageal precautions)                Oral Care Recommendations: Oral care BID SLP Visit Diagnosis: Dysphagia, oropharyngeal phase (R13.12);Dysphagia, unspecified (R13.10) Plan: All goals met       GO              Luanna Salk, MS [facility] SLP [phone]   Eugene Jackson 12/03/2016, 2:12 PM

## 2016-12-05 LAB — CULTURE, BLOOD (ROUTINE X 2)
Culture: NO GROWTH
Special Requests: ADEQUATE

## 2016-12-13 ENCOUNTER — Encounter (HOSPITAL_COMMUNITY): Payer: Self-pay | Admitting: Emergency Medicine

## 2016-12-13 ENCOUNTER — Emergency Department (HOSPITAL_COMMUNITY): Payer: 59

## 2016-12-13 ENCOUNTER — Emergency Department (HOSPITAL_COMMUNITY)
Admission: EM | Admit: 2016-12-13 | Discharge: 2016-12-14 | Disposition: A | Discharge status: Home / Self Care | Payer: 59 | Attending: Emergency Medicine | Admitting: Emergency Medicine

## 2016-12-13 DIAGNOSIS — R600 Localized edema: Secondary | ICD-10-CM | POA: Diagnosis present

## 2016-12-13 DIAGNOSIS — Z79899 Other long term (current) drug therapy: Secondary | ICD-10-CM | POA: Insufficient documentation

## 2016-12-13 DIAGNOSIS — F17210 Nicotine dependence, cigarettes, uncomplicated: Secondary | ICD-10-CM | POA: Insufficient documentation

## 2016-12-13 DIAGNOSIS — I10 Essential (primary) hypertension: Secondary | ICD-10-CM | POA: Insufficient documentation

## 2016-12-13 DIAGNOSIS — R609 Edema, unspecified: Secondary | ICD-10-CM

## 2016-12-13 DIAGNOSIS — M79661 Pain in right lower leg: Secondary | ICD-10-CM | POA: Diagnosis not present

## 2016-12-13 DIAGNOSIS — M79662 Pain in left lower leg: Secondary | ICD-10-CM | POA: Diagnosis not present

## 2016-12-13 HISTORY — DX: Unspecified convulsions: R56.9

## 2016-12-13 MED ORDER — LORAZEPAM 1 MG PO TABS
0.0000 mg | ORAL_TABLET | Freq: Four times a day (QID) | ORAL | Status: DC
  Administered 2016-12-13: 1 mg via ORAL
  Filled 2016-12-13: qty 1

## 2016-12-13 MED ORDER — LORAZEPAM 2 MG/ML IJ SOLN: 0.0000 mg | Freq: Two times a day (BID) | INTRAMUSCULAR | Status: DC

## 2016-12-13 MED ORDER — VITAMIN B-1 100 MG PO TABS: 100.0000 mg | ORAL_TABLET | Freq: Every day | ORAL | Status: DC

## 2016-12-13 MED ORDER — THIAMINE HCL 100 MG/ML IJ SOLN: 100.0000 mg | Freq: Every day | INTRAMUSCULAR | Status: DC

## 2016-12-13 MED ORDER — LORAZEPAM 1 MG PO TABS: 0.0000 mg | ORAL_TABLET | Freq: Two times a day (BID) | ORAL | Status: DC

## 2016-12-13 MED ORDER — LORAZEPAM 2 MG/ML IJ SOLN: 0.0000 mg | Freq: Four times a day (QID) | INTRAMUSCULAR | Status: DC

## 2016-12-13 NOTE — ED Provider Notes (Signed)
[facility] DEPT Provider Note   CSN: [id number] Arrival date & time: 12/13/16  1756     History   Chief Complaint Chief Complaint  Patient presents with  . Leg Swelling    bilat    HPI Eugene Jackson is a 46 y.o. male.  Patient presents to the ED with a chief complaint of bilateral lower extremity swelling.  He states that it is normal for him to have some swelling around his ankles, but now has some swelling up into his calves.  He reports increased pain, which brought him to the ED today.  He denies any chest pain or SOB.  He reports that his is an alcoholic and drinks daily, but did not drink anything today.  He denies any recent surgeries, immobilizations, recent long travel.  Denies hx of PE/DVT.  He denies any other associated symptoms.  Again, he is just here today because of the pain he was having today from the swelling in his legs.  Of note, patient reports recently being admitted for alcohol withdrawal seizures.   The history is provided by the patient. No language interpreter was used.    Past Medical History:  Diagnosis Date  . Broken femur ([place])    right  . Hypertension   . PONV (postoperative nausea and vomiting)   . Seizures [facility])     Patient Active Problem List   Diagnosis Date Noted  . Low vitamin B12 level 12/03/2016  . Macrocytic anemia   . Anemia 12/02/2016  . Acute lower UTI: Probable  11/30/2016  . PNA (pneumonia): Probable 11/30/2016  . Fever   . Essential hypertension 11/28/2016  . Alcohol related seizure ([place])   . Hypomagnesemia   . Iron overload 11/09/2012  . Thrombocytosis ([place]) 10/29/2012  . Severe sepsis ([place]) 10/19/2012  . Parapneumonic effusion 10/18/2012  . Hemoptysis 10/15/2012  . Smoker 10/15/2012  . Bacteremia 10/12/2012  . Acute respiratory failure ([place]) 10/10/2012  . HCAP (healthcare-associated pneumonia) 10/10/2012  . Acute alcohol intoxication ([place]) 09/05/2012  . Anemia of chronic  disease 09/05/2012  . Abnormal liver function tests 09/05/2012  . Moderate protein-calorie malnutrition ([place]) 09/05/2012  . Alcohol withdrawal seizure ([place]) 09/05/2012  . Thrombocytopenia ([place]) 08/31/2012  . DIC (disseminated intravascular coagulation) ([place]) 08/31/2012  . Alcohol abuse 08/30/2012  . Hyponatremia 08/30/2012  . Hypokalemia 08/30/2012  . Elevated lipase 08/30/2012  . Pancytopenia ([place]) 08/30/2012  . Encephalopathy acute 08/30/2012    Past Surgical History:  Procedure Laterality Date  . JOINT REPLACEMENT     bilateral hip replacement  . WRIST SURGERY         Home Medications    Prior to Admission medications   Medication Sig Start Date End Date Taking? Authorizing Provider  folic acid (FOLVITE) 1 MG tablet Take 1 tablet (1 mg total) by mouth daily. 12/04/16   Eugenie Filler, MD  hydrOXYzine (ATARAX/VISTARIL) 10 MG tablet Take 1 tablet (10 mg total) by mouth 3 (three) times daily as needed for nausea. 12/03/16   Eugenie Filler, MD  magnesium oxide (MAG-OX) 400 (241.3 Mg) MG tablet Take 1 tablet (400 mg total) by mouth 2 (two) times daily. 12/03/16   Eugenie Filler, MD  Multiple Vitamin (MULTIVITAMIN WITH MINERALS) TABS tablet Take 1 tablet by mouth daily. 09/07/12   Robbie Lis, MD  nicotine (NICODERM CQ - DOSED IN MG/24 HOURS) 21 mg/24hr patch Place 1 patch (21 mg total) onto the skin daily. 12/04/16   Irine Seal  V, MD  thiamine 100 MG tablet Take 1 tablet (100 mg total) by mouth daily. 12/04/16   Eugenie Filler, MD  vitamin B-12 1000 MCG tablet Take 1 tablet (1,000 mcg total) by mouth daily. 12/04/16   Eugenie Filler, MD    Family History Family History  Problem Relation Age of Onset  . Cancer Mother 75       breast ca  . Cancer Father     Social History Social History   Tobacco Use  . Smoking status: Current Some Day Smoker    Packs/day: 0.10    Years: 26.00    Pack years: 2.60    Types: Cigarettes  . Smokeless tobacco: Never  Used  . Tobacco comment: not really inhaling right now  Substance Use Topics  . Alcohol use: Yes    Comment: 1/5 th vodka every 2 days  . Drug use: No     Allergies   Patient has no known allergies.   Review of Systems Review of Systems  All other systems reviewed and are negative.    Physical Exam Updated Vital Signs BP (!) 144/79   Pulse (!) 117   Temp 98.1 F (36.7 C) (Oral)   Resp 18   SpO2 97%   Physical Exam  Constitutional: He is oriented to person, place, and time. He appears well-developed and well-nourished.  HENT:  Head: Normocephalic and atraumatic.  Eyes: Conjunctivae and EOM are normal. Pupils are equal, round, and reactive to light. Right eye exhibits no discharge. Left eye exhibits no discharge. No scleral icterus.  Neck: Normal range of motion. Neck supple. No JVD present.  Cardiovascular: Normal rate, regular rhythm and normal heart sounds. Exam reveals no gallop and no friction rub.  No murmur heard. Pulmonary/Chest: Effort normal and breath sounds normal. No respiratory distress. He has no wheezes. He has no rales. He exhibits no tenderness.  Abdominal: Soft. He exhibits no distension and no mass. There is no tenderness. There is no rebound and no guarding.  Musculoskeletal: Normal range of motion. He exhibits edema. He exhibits no tenderness.  1+ bilateral pitting edema to the mid-calves  Neurological: He is alert and oriented to person, place, and time.  Tremulous   Skin: Skin is warm and dry.  Dry erythematous lesions on shins which appear consistent with eczema  Psychiatric: He has a normal mood and affect. His behavior is normal. Judgment and thought content normal.  Nursing note and vitals reviewed.    ED Treatments / Results  Labs (all labs ordered are listed, but only abnormal results are displayed) Labs Reviewed  CBC WITH DIFFERENTIAL/PLATELET  COMPREHENSIVE METABOLIC PANEL    EKG  EKG Interpretation None       Radiology Dg  Chest 2 View  Result Date: 12/13/2016 CLINICAL DATA:  Wheezing and dyspnea.  Lower extremity swelling. EXAM: CHEST  2 VIEW COMPARISON:  11/30/2016, 11/28/2016 FINDINGS: Stable curvilinear scarring in the left base with chronic pleural thickening in the lateral costophrenic angle. The lungs are otherwise clear. Normal pulmonary vasculature. No pleural effusion. Hilar, mediastinal and cardiac contours are unremarkable and unchanged. IMPRESSION: Stable scarring in the left base. No acute cardiopulmonary findings. Electronically Signed   By: Andreas Newport M.D.   On: 12/13/2016 23:06    Procedures Procedures (including critical care time)  Medications Ordered in ED Medications  LORazepam (ATIVAN) injection 0-4 mg (not administered)    Or  LORazepam (ATIVAN) tablet 0-4 mg (not administered)  LORazepam (ATIVAN) injection 0-4 mg (  not administered)    Or  LORazepam (ATIVAN) tablet 0-4 mg (not administered)  thiamine (VITAMIN B-1) tablet 100 mg (not administered)    Or  thiamine (B-1) injection 100 mg (not administered)     Initial Impression / Assessment and Plan / ED Course  I have reviewed the triage vital signs and the nursing notes.  Pertinent labs & imaging results that were available during my care of the patient were reviewed by me and considered in my medical decision making (see chart for details).     Patient here for pain in legs 2/2 leg swelling.  This is not a new problem, but is worse.  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  Likely dependant edema.  No CP or SOB.  Doubt DVT, bilateral symptoms.  No sign of infection.  Does have some wheezing in his lungs, which is also not new.  Will check basic labs and CXR.  Also appears very tremulous and is a heavy drinker, but no alcohol today.  Will check CIWA and plan to give some ativan.  Labs are at or near baseline. CXR unremarkable for acute findings. Recommend compression stockings and close follow-up.  Final Clinical Impressions(s)  / ED Diagnoses   Final diagnoses:  Peripheral edema    ED Discharge Orders    None       Montine Circle, PA-C 12/14/16 0014    Isla Pence, MD 12/14/16 [phone]

## 2016-12-13 NOTE — ED Triage Notes (Signed)
Patient c/o bilat lower leg swelling over past several days.

## 2016-12-14 LAB — CBC WITH DIFFERENTIAL/PLATELET
BASOS PCT: 1 %
Basophils Absolute: 0.1 10*3/uL (ref 0.0–0.1)
Eosinophils Absolute: 0.1 10*3/uL (ref 0.0–0.7)
Eosinophils Relative: 1 %
HCT: 31.3 % — ABNORMAL LOW (ref 39.0–52.0)
HEMOGLOBIN: 10.5 g/dL — AB (ref 13.0–17.0)
LYMPHS PCT: 20 %
Lymphs Abs: 2.1 10*3/uL (ref 0.7–4.0)
MCH: 38.3 pg — ABNORMAL HIGH (ref 26.0–34.0)
MCHC: 33.5 g/dL (ref 30.0–36.0)
MCV: 114.2 fL — ABNORMAL HIGH (ref 78.0–100.0)
Monocytes Absolute: 0.9 10*3/uL (ref 0.1–1.0)
Monocytes Relative: 9 %
NEUTROS ABS: 7.3 10*3/uL (ref 1.7–7.7)
Neutrophils Relative %: 69 %
PLATELETS: 416 10*3/uL — AB (ref 150–400)
RBC: 2.74 MIL/uL — ABNORMAL LOW (ref 4.22–5.81)
RDW: 13.1 % (ref 11.5–15.5)
WBC: 10.5 10*3/uL (ref 4.0–10.5)

## 2016-12-14 LAB — COMPREHENSIVE METABOLIC PANEL
ALK PHOS: 106 U/L (ref 38–126)
ALT: 20 U/L (ref 17–63)
AST: 50 U/L — ABNORMAL HIGH (ref 15–41)
Albumin: 3.1 g/dL — ABNORMAL LOW (ref 3.5–5.0)
Anion gap: 9 (ref 5–15)
BILIRUBIN TOTAL: 0.9 mg/dL (ref 0.3–1.2)
BUN: 7 mg/dL (ref 6–20)
CALCIUM: 8.6 mg/dL — AB (ref 8.9–10.3)
CO2: 25 mmol/L (ref 22–32)
CREATININE: 0.65 mg/dL (ref 0.61–1.24)
Chloride: 105 mmol/L (ref 101–111)
GFR calc non Af Amer: >60 mL/min (ref >60)
Glucose, Bld: 100 mg/dL — ABNORMAL HIGH (ref 65–99)
Potassium: 3.3 mmol/L — ABNORMAL LOW (ref 3.5–5.1)
SODIUM: 139 mmol/L (ref 135–145)
TOTAL PROTEIN: 6.5 g/dL (ref 6.5–8.1)

## 2016-12-14 NOTE — Discharge Instructions (Signed)
Please wear your compression stockings during the daytime, especially at work and follow-up with your doctor in 1 week for re-evaluation.

## 2019-04-18 ENCOUNTER — Encounter (HOSPITAL_COMMUNITY): Payer: Self-pay

## 2019-04-18 ENCOUNTER — Inpatient Hospital Stay (HOSPITAL_COMMUNITY)
Admission: EM | Admit: 2019-04-18 | Discharge: 2019-04-23 | DRG: 897 | Disposition: A | Discharge status: Home Health | Payer: 59 | Attending: Internal Medicine | Admitting: Internal Medicine

## 2019-04-18 ENCOUNTER — Emergency Department (HOSPITAL_COMMUNITY): Payer: 59

## 2019-04-18 ENCOUNTER — Other Ambulatory Visit: Payer: Self-pay

## 2019-04-18 DIAGNOSIS — I10 Essential (primary) hypertension: Secondary | ICD-10-CM | POA: Diagnosis present

## 2019-04-18 DIAGNOSIS — E519 Thiamine deficiency, unspecified: Secondary | ICD-10-CM | POA: Diagnosis present

## 2019-04-18 DIAGNOSIS — F17210 Nicotine dependence, cigarettes, uncomplicated: Secondary | ICD-10-CM | POA: Diagnosis present

## 2019-04-18 DIAGNOSIS — R7989 Other specified abnormal findings of blood chemistry: Secondary | ICD-10-CM | POA: Diagnosis present

## 2019-04-18 DIAGNOSIS — D638 Anemia in other chronic diseases classified elsewhere: Secondary | ICD-10-CM | POA: Diagnosis present

## 2019-04-18 DIAGNOSIS — E860 Dehydration: Secondary | ICD-10-CM | POA: Diagnosis present

## 2019-04-18 DIAGNOSIS — F10239 Alcohol dependence with withdrawal, unspecified: Secondary | ICD-10-CM | POA: Diagnosis present

## 2019-04-18 DIAGNOSIS — E440 Moderate protein-calorie malnutrition: Secondary | ICD-10-CM | POA: Diagnosis present

## 2019-04-18 DIAGNOSIS — E876 Hypokalemia: Secondary | ICD-10-CM | POA: Diagnosis not present

## 2019-04-18 DIAGNOSIS — D696 Thrombocytopenia, unspecified: Secondary | ICD-10-CM | POA: Diagnosis present

## 2019-04-18 DIAGNOSIS — F10939 Alcohol use, unspecified with withdrawal, unspecified: Secondary | ICD-10-CM | POA: Diagnosis present

## 2019-04-18 DIAGNOSIS — Z803 Family history of malignant neoplasm of breast: Secondary | ICD-10-CM

## 2019-04-18 DIAGNOSIS — E538 Deficiency of other specified B group vitamins: Secondary | ICD-10-CM

## 2019-04-18 DIAGNOSIS — Z96643 Presence of artificial hip joint, bilateral: Secondary | ICD-10-CM | POA: Diagnosis present

## 2019-04-18 DIAGNOSIS — R945 Abnormal results of liver function studies: Secondary | ICD-10-CM | POA: Diagnosis not present

## 2019-04-18 DIAGNOSIS — Z20822 Contact with and (suspected) exposure to covid-19: Secondary | ICD-10-CM | POA: Diagnosis present

## 2019-04-18 DIAGNOSIS — F10230 Alcohol dependence with withdrawal, uncomplicated: Principal | ICD-10-CM | POA: Diagnosis present

## 2019-04-18 DIAGNOSIS — E871 Hypo-osmolality and hyponatremia: Secondary | ICD-10-CM | POA: Diagnosis present

## 2019-04-18 DIAGNOSIS — E872 Acidosis: Secondary | ICD-10-CM

## 2019-04-18 DIAGNOSIS — D539 Nutritional anemia, unspecified: Secondary | ICD-10-CM

## 2019-04-18 DIAGNOSIS — F1010 Alcohol abuse, uncomplicated: Secondary | ICD-10-CM | POA: Diagnosis not present

## 2019-04-18 DIAGNOSIS — I959 Hypotension, unspecified: Secondary | ICD-10-CM | POA: Diagnosis present

## 2019-04-18 DIAGNOSIS — I471 Supraventricular tachycardia: Secondary | ICD-10-CM | POA: Diagnosis present

## 2019-04-18 DIAGNOSIS — F10930 Alcohol use, unspecified with withdrawal, uncomplicated: Secondary | ICD-10-CM

## 2019-04-18 DIAGNOSIS — Z6820 Body mass index (BMI) 20.0-20.9, adult: Secondary | ICD-10-CM

## 2019-04-18 DIAGNOSIS — M7989 Other specified soft tissue disorders: Secondary | ICD-10-CM

## 2019-04-18 DIAGNOSIS — K7010 Alcoholic hepatitis without ascites: Secondary | ICD-10-CM | POA: Diagnosis present

## 2019-04-18 LAB — CBC
HCT: 34.5 % — ABNORMAL LOW (ref 39.0–52.0)
Hemoglobin: 12.2 g/dL — ABNORMAL LOW (ref 13.0–17.0)
MCH: 36.9 pg — ABNORMAL HIGH (ref 26.0–34.0)
MCHC: 35.4 g/dL (ref 30.0–36.0)
MCV: 104.2 fL — ABNORMAL HIGH (ref 80.0–100.0)
Platelets: 368 10*3/uL (ref 150–400)
RBC: 3.31 MIL/uL — ABNORMAL LOW (ref 4.22–5.81)
RDW: 12.7 % (ref 11.5–15.5)
WBC: 13.7 10*3/uL — ABNORMAL HIGH (ref 4.0–10.5)
nRBC: 0.1 % (ref 0.0–0.2)

## 2019-04-18 LAB — COMPREHENSIVE METABOLIC PANEL
ALT: 33 U/L (ref 0–44)
AST: 86 U/L — ABNORMAL HIGH (ref 15–41)
Albumin: 3.4 g/dL — ABNORMAL LOW (ref 3.5–5.0)
Alkaline Phosphatase: 139 U/L — ABNORMAL HIGH (ref 38–126)
Anion gap: 22 — ABNORMAL HIGH (ref 5–15)
BUN: 14 mg/dL (ref 6–20)
CO2: 29 mmol/L (ref 22–32)
Calcium: 8.1 mg/dL — ABNORMAL LOW (ref 8.9–10.3)
Chloride: 74 mmol/L — ABNORMAL LOW (ref 98–111)
Creatinine, Ser: 0.83 mg/dL (ref 0.61–1.24)
Glucose, Bld: 122 mg/dL — ABNORMAL HIGH (ref 70–99)
Potassium: 2.2 mmol/L — CL (ref 3.5–5.1)
Sodium: 125 mmol/L — ABNORMAL LOW (ref 135–145)
Total Bilirubin: 2.9 mg/dL — ABNORMAL HIGH (ref 0.3–1.2)
Total Protein: 7.5 g/dL (ref 6.5–8.1)

## 2019-04-18 LAB — AMMONIA: Ammonia: 64 umol/L — ABNORMAL HIGH (ref 9–35)

## 2019-04-18 LAB — ETHANOL: Alcohol, Ethyl (B): <10 mg/dL (ref <10)

## 2019-04-18 LAB — MAGNESIUM: Magnesium: 1.6 mg/dL — ABNORMAL LOW (ref 1.7–2.4)

## 2019-04-18 MED ORDER — POTASSIUM CHLORIDE 10 MEQ/100ML IV SOLN
10.0000 meq | INTRAVENOUS | Status: AC
  Administered 2019-04-19 (×3): 10 meq via INTRAVENOUS
  Filled 2019-04-18 (×3): qty 100

## 2019-04-18 MED ORDER — THIAMINE HCL 100 MG/ML IJ SOLN
100.0000 mg | Freq: Every day | INTRAMUSCULAR | Status: DC
  Administered 2019-04-19 – 2019-04-23 (×3): 100 mg via INTRAVENOUS
  Filled 2019-04-18 (×4): qty 2

## 2019-04-18 MED ORDER — LACTATED RINGERS IV BOLUS
1000.0000 mL | Freq: Once | INTRAVENOUS | Status: AC
  Administered 2019-04-18: 1000 mL via INTRAVENOUS

## 2019-04-18 MED ORDER — POTASSIUM CHLORIDE 10 MEQ/100ML IV SOLN
10.0000 meq | Freq: Once | INTRAVENOUS | Status: AC
  Administered 2019-04-18: 10 meq via INTRAVENOUS
  Filled 2019-04-18: qty 100

## 2019-04-18 MED ORDER — POTASSIUM CHLORIDE CRYS ER 20 MEQ PO TBCR
40.0000 meq | EXTENDED_RELEASE_TABLET | Freq: Once | ORAL | Status: AC
  Administered 2019-04-19: 01:00:00 40 meq via ORAL
  Filled 2019-04-18: qty 2

## 2019-04-18 MED ORDER — MAGNESIUM SULFATE IN D5W 1-5 GM/100ML-% IV SOLN
1.0000 g | Freq: Once | INTRAVENOUS | Status: AC
  Administered 2019-04-19: 1 g via INTRAVENOUS
  Filled 2019-04-18: qty 100

## 2019-04-18 NOTE — ED Triage Notes (Signed)
Pt sts here for etoh withdrawal and shaking. Pt Last drink 2 days ago and sts a half gallon of vodka a week.

## 2019-04-18 NOTE — ED Provider Notes (Signed)
[facility] DEPT Provider Note   CSN: [id number] Arrival date & time: 04/18/19  1915     History Chief Complaint  Patient presents with  . Withdrawal  Generalized weakness and dizziness.  Eugene Jackson is a 49 y.o. male.  HPI Patient presents with generalized weakness and dizziness.  States that he has been feeling bad around a month.  States he feels dizzy when he stands up.  States he has had numerous falls.  Did hit his nose.  Denies loss consciousness.  States he feels unsteady even when he stopped drinking.  He is however heavy drinker.  Drinks about half a gallon of vodka a week.  Last drink around 2 days ago.  No abdominal pain.  Has had decreased oral intake.  No nausea or vomiting.  No diarrhea.  No chest pain.    Past Medical History:  Diagnosis Date  . Broken femur ([place])    right  . Hypertension   . PONV (postoperative nausea and vomiting)   . Seizures [facility])     Patient Active Problem List   Diagnosis Date Noted  . Low vitamin B12 level 12/03/2016  . Macrocytic anemia   . Anemia 12/02/2016  . Acute lower UTI: Probable  11/30/2016  . PNA (pneumonia): Probable 11/30/2016  . Fever   . Essential hypertension 11/28/2016  . Alcohol related seizure ([place])   . Hypomagnesemia   . Iron overload 11/09/2012  . Thrombocytosis ([place]) 10/29/2012  . Severe sepsis ([place]) 10/19/2012  . Parapneumonic effusion 10/18/2012  . Hemoptysis 10/15/2012  . Smoker 10/15/2012  . Bacteremia 10/12/2012  . Acute respiratory failure ([place]) 10/10/2012  . HCAP (healthcare-associated pneumonia) 10/10/2012  . Acute alcohol intoxication ([place]) 09/05/2012  . Anemia of chronic disease 09/05/2012  . Abnormal liver function tests 09/05/2012  . Moderate protein-calorie malnutrition ([place]) 09/05/2012  . Alcohol withdrawal seizure ([place]) 09/05/2012  . Thrombocytopenia ([place]) 08/31/2012  . DIC (disseminated intravascular coagulation) ([place]) 08/31/2012  . Alcohol  abuse 08/30/2012  . Hyponatremia 08/30/2012  . Hypokalemia 08/30/2012  . Elevated lipase 08/30/2012  . Pancytopenia ([place]) 08/30/2012  . Encephalopathy acute 08/30/2012    Past Surgical History:  Procedure Laterality Date  . JOINT REPLACEMENT     bilateral hip replacement  . WRIST SURGERY         Family History  Problem Relation Age of Onset  . Cancer Mother 64       breast ca  . Cancer Father     Social History   Tobacco Use  . Smoking status: Current Some Day Smoker    Packs/day: 0.10    Years: 26.00    Pack years: 2.60    Types: Cigarettes  . Smokeless tobacco: Never Used  . Tobacco comment: not really inhaling right now  Substance Use Topics  . Alcohol use: Yes    Comment: 1/5 th vodka every 2 days  . Drug use: No    Home Medications Prior to Admission medications   Medication Sig Start Date End Date Taking? Authorizing Provider  Multiple Vitamin (MULTIVITAMIN WITH MINERALS) TABS tablet Take 1 tablet by mouth daily. 09/07/12  Yes Robbie Lis, MD  folic acid (FOLVITE) 1 MG tablet Take 1 tablet (1 mg total) by mouth daily. Patient not taking: Reported on 04/18/2019 12/04/16   Eugenie Filler, MD  hydrOXYzine (ATARAX/VISTARIL) 10 MG tablet Take 1 tablet (10 mg total) by mouth 3 (three) times daily as needed for nausea. Patient not taking: Reported on  04/18/2019 12/03/16   Eugenie Filler, MD  magnesium oxide (MAG-OX) 400 (241.3 Mg) MG tablet Take 1 tablet (400 mg total) by mouth 2 (two) times daily. Patient not taking: Reported on 04/18/2019 12/03/16   Eugenie Filler, MD  nicotine (NICODERM CQ - DOSED IN MG/24 HOURS) 21 mg/24hr patch Place 1 patch (21 mg total) onto the skin daily. Patient not taking: Reported on 04/18/2019 12/04/16   Eugenie Filler, MD  thiamine 100 MG tablet Take 1 tablet (100 mg total) by mouth daily. Patient not taking: Reported on 04/18/2019 12/04/16   Eugenie Filler, MD  vitamin B-12 1000 MCG tablet Take 1 tablet (1,000 mcg total)  by mouth daily. Patient not taking: Reported on 04/18/2019 12/04/16   Eugenie Filler, MD    Allergies    Patient has no known allergies.  Review of Systems   Review of Systems  Constitutional: Positive for appetite change and fatigue. Negative for fever.  HENT: Negative for congestion.   Cardiovascular: Negative for chest pain.  Gastrointestinal: Negative for abdominal pain.  Endocrine: Negative for polyuria.  Genitourinary: Negative for flank pain.  Skin: Positive for wound.  Neurological: Positive for weakness.  Psychiatric/Behavioral: Negative for confusion.    Physical Exam Updated Vital Signs BP 101/76   Pulse 89   Temp (!) 97.5 F (36.4 C) (Oral)   Resp 17   Ht 5\' 9"  (1.753 m)   Wt 64 kg   SpO2 100%   BMI 20.82 kg/m   Physical Exam Vitals and nursing note reviewed.  HENT:     Head:     Comments: Small laceration across bridge of nose likely from glasses.  No underlying bony tenderness.    Mouth/Throat:     Mouth: Mucous membranes are moist.  Eyes:     Extraocular Movements: Extraocular movements intact.     Pupils: Pupils are equal, round, and reactive to light.  Pulmonary:     Breath sounds: No wheezing or rhonchi.  Abdominal:     Comments: Hepatomegaly  Musculoskeletal:        General: No tenderness.     Cervical back: Neck supple.  Skin:    General: Skin is warm.     Capillary Refill: Capillary refill takes less than 2 seconds.  Neurological:     Mental Status: He is alert and oriented to person, place, and time.     Comments: No tremors to hand.  Awake and answers questions.     ED Results / Procedures / Treatments   Labs (all labs ordered are listed, but only abnormal results are displayed) Labs Reviewed  CBC - Abnormal; Notable for the following components:      Result Value   WBC 13.7 (*)    RBC 3.31 (*)    Hemoglobin 12.2 (*)    HCT 34.5 (*)    MCV 104.2 (*)    MCH 36.9 (*)    All other components within normal limits    COMPREHENSIVE METABOLIC PANEL - Abnormal; Notable for the following components:   Sodium 125 (*)    Potassium 2.2 (*)    Chloride 74 (*)    Glucose, Bld 122 (*)    Calcium 8.1 (*)    Albumin 3.4 (*)    AST 86 (*)    Alkaline Phosphatase 139 (*)    Total Bilirubin 2.9 (*)    Anion gap 22.0 (*)    All other components within normal limits  AMMONIA - Abnormal; Notable for  the following components:   Ammonia 64 (*)    All other components within normal limits  MAGNESIUM - Abnormal; Notable for the following components:   Magnesium 1.6 (*)    All other components within normal limits  SARS CORONAVIRUS 2 (TAT 6-24 HRS)  ETHANOL  RAPID URINE DRUG SCREEN, HOSP PERFORMED  PROTIME-INR    EKG EKG Interpretation  Date/Time:  Thursday April 18 2019 20:02:40 EDT Ventricular Rate:  95 PR Interval:    QRS Duration: 90 QT Interval:  384 QTC Calculation: 483 R Axis:   5 Text Interpretation: Sinus rhythm Consider right atrial enlargement Minimal ST depression, lateral leads Borderline prolonged QT interval No significant change since last tracing Confirmed by Davonna Belling [phone]) on 04/18/2019 11:04:58 PM   Radiology DG Chest Portable 1 View  Result Date: 04/18/2019 CLINICAL DATA:  Altered mental status EXAM: PORTABLE CHEST 1 VIEW COMPARISON:  Radiograph 12/13/2016 FINDINGS: Stable region of scarring seen in the left lung base with the slight blunting of the costophrenic sulcus possibly related architectural distortion rather than effusion. No convincing features of acute consolidation or edema. No pneumothorax or sizable pleural effusion is evident. Pulmonary vascularity is normally distributed. The aorta is calcified. The remaining cardiomediastinal contours are unremarkable. No acute osseous or soft tissue abnormality. Degenerative changes are present in the imaged spine and shoulders. Telemetry leads overlie the chest. IMPRESSION: Stable scarring in the left lung base. No acute  cardiopulmonary abnormality. Electronically Signed   By: Lovena Le M.D.   On: 04/18/2019 22:45    Procedures Procedures (including critical care time)  Medications Ordered in ED Medications  potassium chloride 10 mEq in 100 mL IVPB (10 mEq Intravenous New Bag/Given 04/18/19 2253)  lactated ringers bolus 1,000 mL (1,000 mLs Intravenous New Bag/Given (Non-Interop) 04/18/19 2059)    ED Course  I have reviewed the triage vital signs and the nursing notes.  Pertinent labs & imaging results that were available during my care of the patient were reviewed by me and considered in my medical decision making (see chart for details).    MDM Rules/Calculators/A&P                      Patient presents with generalized weakness.Marland Kitchen  Heavy drinker.  Ammonia mildly elevated.  Hypokalemia hypomagnesemia.  Does not have tremors however.  Hypotension improved somewhat with IV fluids.  With generalized weakness and electrolyte abnormalities feels the patient benefit from admission to hospital.  Will discuss with hospitalist.  Head CT pending. Final Clinical Impression(s) / ED Diagnoses Final diagnoses:  Alcohol withdrawal syndrome without complication ([place])  Hypokalemia  Hypomagnesemia    Rx / DC Orders ED Discharge Orders    None       Davonna Belling, MD 04/18/19 2312

## 2019-04-18 NOTE — ED Notes (Signed)
Pt is aware we need urine.  Unable to provide a sample at this time.

## 2019-04-18 NOTE — ED Notes (Signed)
Attempted to get blood work off of IV but unable to. Called phlebotomy to obtain ammonia.

## 2019-04-18 NOTE — H&P (Signed)
Eugene Jackson [id number] DOB: [birth year] DOA: 04/18/2019     PCP: [name], MD   Outpatient Specialists:   NONE    Patient arrived to ER on 04/18/19 at [place]  Patient coming from: home Lives alone,       Chief Complaint:  Chief Complaint  Patient presents with  . Withdrawal    HPI: Eugene Jackson is a 49 y.o. male with medical history significant of ETOH    Presented with generalized weakness lightheaded, have been drinking less Et OH Have been falling a lot. 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  Patient has had admissions in the past with multiple electrolyte abnormalities and a low vitamin B12 levels   Infectious risk factors:  Reports severe fatigue   Has NOt been vaccinated against COVID    in house  PCR testing  Pending  No results found for: SARSCOV2NAA   Regarding pertinent Chronic problems:   Alcohol abuse with multiple complications continues to drink  While in ER: Multiple abnormal electrolytes hypotensive in ER was given IV fluids CT head unremakble   Hospitalist was called for admission for alcohol withdrawal multiple electrolyte abnormalities  The following Work up has been ordered so far:  Orders Placed This Encounter  Procedures  . SARS CORONAVIRUS 2 (TAT 6-24 HRS) Nasopharyngeal Nasopharyngeal Swab  . CT Head Wo Contrast  . DG Chest Portable 1 View  . Ethanol  . cbc  . Rapid urine drug screen (hospital performed)  . Comprehensive metabolic panel  . Ammonia  . Magnesium  . Protime-INR  . Clinical institute withdrawal assessment  . Orthostatic vital signs  . Cardiac monitoring  . Consult to hospitalist   ALL PATIENTS BEING ADMITTED/HAVING PROCEDURES NEED COVID-19 SCREENING  . ED EKG     Following Medications were ordered in ER: Medications  potassium chloride 10 mEq in 100 mL IVPB (10 mEq Intravenous New Bag/Given 04/18/19 2253)  lactated ringers bolus 1,000 mL (1,000 mLs Intravenous New Bag/Given (Non-Interop) 04/18/19 2059)        Consult Orders  (From admission, onward)         Start     Ordered   04/18/19 2314  Consult to hospitalist  ALL PATIENTS BEING ADMITTED/HAVING PROCEDURES NEED COVID-19 SCREENING  Once    Comments: ALL PATIENTS BEING ADMITTED/HAVING PROCEDURES NEED COVID-19 SCREENING  Provider:  (Not yet assigned)  Question Answer Comment  Place call to: [facility]   Reason for Consult Admit      04/18/19 2313           Significant initial  Findings: Abnormal Labs Reviewed  CBC - Abnormal; Notable for the following components:      Result Value   WBC 13.7 (*)    RBC 3.31 (*)    Hemoglobin 12.2 (*)    HCT 34.5 (*)    MCV 104.2 (*)    MCH 36.9 (*)    All other components within normal limits  COMPREHENSIVE METABOLIC PANEL - Abnormal; Notable for the following components:   Sodium 125 (*)    Potassium 2.2 (*)    Chloride 74 (*)    Glucose, Bld 122 (*)  Calcium 8.1 (*)    Albumin 3.4 (*)    AST 86 (*)    Alkaline Phosphatase 139 (*)    Total Bilirubin 2.9 (*)    Anion gap 22.0 (*)    All other components within normal limits  AMMONIA - Abnormal; Notable for the following components:   Ammonia 64 (*)    All other components within normal limits  MAGNESIUM - Abnormal; Notable for the following components:   Magnesium 1.6 (*)    All other components within normal limits    Otherwise labs showing:    Recent Labs  Lab 04/18/19 2044 04/18/19 2101  NA 125*  --   K 2.2*  --   CO2 29  --   GLUCOSE 122*  --   BUN 14  --   CREATININE 0.83  --   CALCIUM 8.1*  --   MG  --  1.6*    Cr    Up from baseline see below Lab Results  Component  Value Date   CREATININE 0.83 04/18/2019   CREATININE 0.65 12/13/2016   CREATININE 0.56 (L) 12/03/2016    Recent Labs  Lab 04/18/19 2044  AST 86*  ALT 33  ALKPHOS 139*  BILITOT 2.9*  PROT 7.5  ALBUMIN 3.4*   Lab Results  Component Value Date   CALCIUM 8.1 (L) 04/18/2019   PHOS 3.7 10/16/2012     WBC      Component Value Date/Time   WBC 13.7 (H) 04/18/2019 1943   ANC    Component Value Date/Time   NEUTROABS 7.3 12/13/2016 2332   NEUTROABS 5.5 11/09/2012 1211     Plt: Lab Results  Component Value Date   PLT 368 04/18/2019      COVID-19 Labs  Recent Labs    04/19/19 0017  FERRITIN 303    No results found for: SARSCOV2NAA   HG/HCT  stable,      Component Value Date/Time   HGB 12.2 (L) 04/18/2019 1943   HGB 11.1 (L) 11/09/2012 1211   HCT 34.5 (L) 04/18/2019 1943   HCT 33.9 (L) 11/09/2012 1211    No results for input(s): LIPASE, AMYLASE in the last 168 hours. Recent Labs  Lab 04/18/19 2120  AMMONIA 64*    No components found for: LABALBU    ECG: Ordered Personally reviewed by me showing: HR : 95 Rhythm:  NSR,   no evidence of ischemic changes QTC 483    UA  ordered      Ordered  CT HEAD  NON acute  CXR -  NON acute     ED Triage Vitals  Enc Vitals Group     BP 04/18/19 1925 (S) 90/72     Pulse Rate 04/18/19 1925 (!) 110     Resp 04/18/19 1925 16     Temp 04/18/19 1925 (!) 97.5 F (36.4 C)     Temp Source 04/18/19 1925 Oral     SpO2 04/18/19 1925 100 %     Weight 04/18/19 1927 141 lb (64 kg)     Height 04/18/19 1927 5\' 9"  (1.753 m)     Head Circumference --      Peak Flow --      Pain Score 04/18/19 1927 0     Pain Loc --      Pain Edu? --      Excl. in [place]? --   TMAX(24)@       Latest  Blood pressure (!) 103/92, pulse 94, temperature (!) 97.5  F (36.4 C), temperature source Oral, resp. rate 16, height 5\' 9"  (1.753 m), weight 64 kg, SpO2 100 %.      Review of Systems:    Pertinent positives include:  fatigue, weight  loss   Constitutional:  No weight loss, night sweats, Fevers, chills, HEENT:  No headaches, Difficulty swallowing,Tooth/dental problems,Sore throat,  No sneezing, itching, ear ache, nasal congestion, post nasal drip,  Cardio-vascular:  No chest pain, Orthopnea, PND, anasarca, dizziness, palpitations.no Bilateral lower extremity swelling  GI:  No heartburn, indigestion, abdominal pain, nausea, vomiting, diarrhea, change in bowel habits, loss of appetite, melena, blood in stool, hematemesis Resp:  no shortness of breath at rest. No dyspnea on exertion, No excess mucus, no productive cough, No non-productive cough, No coughing up of blood.No change in color of mucus.No wheezing. Skin:  no rash or lesions. No jaundice GU:  no dysuria, change in color of urine, no urgency or frequency. No straining to urinate.  No flank pain.  Musculoskeletal:  No joint pain or no joint swelling. No decreased range of motion. No back pain.  Psych:  No change in mood or affect. No depression or anxiety. No memory loss.  Neuro: no localizing neurological complaints, no tingling, no weakness, no double vision, no gait abnormality, no slurred speech, no confusion  All systems reviewed and apart from [place] all are negative  Past Medical History:   Past Medical History:  Diagnosis Date  . Broken femur ([place])    right  . Hypertension   . PONV (postoperative nausea and vomiting)   . Seizures ([place])      Past Surgical History:  Procedure Laterality Date  . JOINT REPLACEMENT     bilateral hip replacement  . WRIST SURGERY      Social History:  Ambulatory   Independently    reports that he has been smoking cigarettes. He has a 2.60 pack-year smoking history. He has never used smokeless tobacco. He reports current alcohol use. He reports that he does not use drugs.    Family History:   Family History  Problem Relation Age of Onset  . Cancer Mother 66       breast ca  . Cancer Father      Allergies: No Known Allergies   Prior to Admission medications   Medication Sig Start Date End Date Taking? Authorizing Provider  Multiple Vitamin (MULTIVITAMIN WITH MINERALS) TABS tablet Take 1 tablet by mouth daily. 09/07/12  Yes Robbie Lis, MD  folic acid (FOLVITE) 1 MG tablet Take 1 tablet (1 mg total) by mouth daily. Patient not taking: Reported on 04/18/2019 12/04/16   Eugenie Filler, MD  hydrOXYzine (ATARAX/VISTARIL) 10 MG tablet Take 1 tablet (10 mg total) by mouth 3 (three) times daily as needed for nausea. Patient not taking: Reported on 04/18/2019 12/03/16   Eugenie Filler, MD  magnesium oxide (MAG-OX) 400 (241.3 Mg) MG tablet Take 1 tablet (400 mg total) by mouth 2 (two) times daily. Patient not taking: Reported on 04/18/2019 12/03/16   Eugenie Filler, MD  nicotine (NICODERM CQ - DOSED IN MG/24 HOURS) 21 mg/24hr patch Place 1 patch (21 mg total) onto the skin daily. Patient not taking: Reported on 04/18/2019 12/04/16   Eugenie Filler, MD  thiamine 100 MG tablet Take 1 tablet (100 mg total) by mouth daily. Patient not taking: Reported on 04/18/2019 12/04/16   Eugenie Filler, MD  vitamin B-12 1000 MCG tablet Take 1 tablet (1,000 mcg total) by mouth daily.  Patient not taking: Reported on 04/18/2019 12/04/16   Eugenie Filler, MD   Physical Exam: Blood pressure (!) 103/92, pulse 94, temperature (!) 97.5 F (36.4 C), temperature source Oral, resp. rate 16, height 5\' 9"  (1.753 m), weight 64 kg, SpO2 100 %. 1. General:  in No  Acute distress    Chronically ill -appearing 2. Psychological: Alert and  Oriented 3. Head/ENT:    Dry Mucous Membranes                          Head Non traumatic, neck supple                           Poor Dentition 4. SKIN:  decreased Skin turgor,  Skin clean Dry and intact no rash 5. Heart: Regular rate and rhythm no  Murmur, no Rub or gallop 6. Lungs:  no wheezes or crackles   7. Abdomen: Soft,   non-tender, Non distended  bowel  sounds present 8. Lower extremities: no clubbing, cyanosis, no edema 9. Neurologically Grossly intact, moving all 4 extremities equally  10. MSK: Normal range of motion   All other LABS:     Recent Labs  Lab 04/18/19 1943  WBC 13.7*  HGB 12.2*  HCT 34.5*  MCV 104.2*  PLT 368     Recent Labs  Lab 04/18/19 2044 04/18/19 2101  NA 125*  --   K 2.2*  --   CL 74*  --   CO2 29  --   GLUCOSE 122*  --   BUN 14  --   CREATININE 0.83  --   CALCIUM 8.1*  --   MG  --  1.6*     Recent Labs  Lab 04/18/19 2044  AST 86*  ALT 33  ALKPHOS 139*  BILITOT 2.9*  PROT 7.5  ALBUMIN 3.4*      Cultures:    Component Value Date/Time   SDES URINE, RANDOM 11/30/2016 1059   SPECREQUEST NONE 11/30/2016 1059   CULT MULTIPLE SPECIES PRESENT, SUGGEST RECOLLECTION (A) 11/30/2016 1059   REPTSTATUS 12/02/2016 FINAL 11/30/2016 1059     Radiological Exams on Admission: CT Head Wo Contrast  Result Date: 04/18/2019 CLINICAL DATA:  Altered mental status, ETOH withdrawal EXAM: CT HEAD WITHOUT CONTRAST TECHNIQUE: Contiguous axial images were obtained from the base of the skull through the vertex without intravenous contrast. COMPARISON:  11/28/2016 FINDINGS: Brain: No evidence of acute infarction, hemorrhage, hydrocephalus, extra-axial collection or mass lesion/mass effect. Periventricular and deep white matter hypodensity. Vascular: No hyperdense vessel or unexpected calcification. Skull: Normal. Negative for fracture or focal lesion. Sinuses/Orbits: No acute finding. Other: None. IMPRESSION: No acute intracranial pathology.  Small-vessel white matter disease. Electronically Signed   By: Eddie Candle M.D.   On: 04/18/2019 23:29   DG Chest Portable 1 View  Result Date: 04/18/2019 CLINICAL DATA:  Altered mental status EXAM: PORTABLE CHEST 1 VIEW COMPARISON:  Radiograph 12/13/2016 FINDINGS: Stable region of scarring seen in the left lung base with the slight blunting of the costophrenic sulcus possibly  related architectural distortion rather than effusion. No convincing features of acute consolidation or edema. No pneumothorax or sizable pleural effusion is evident. Pulmonary vascularity is normally distributed. The aorta is calcified. The remaining cardiomediastinal contours are unremarkable. No acute osseous or soft tissue abnormality. Degenerative changes are present in the imaged spine and shoulders. Telemetry leads overlie the chest. IMPRESSION: Stable scarring in the left  lung base. No acute cardiopulmonary abnormality. Electronically Signed   By: Lovena Le M.D.   On: 04/18/2019 22:45    Chart has been reviewed  Assessment/Plan  49 y.o. male with medical history significant of ETOH     Admitted for  alcohol withdrawal multiple electrolyte abnormalities  Present on Admission: . Alcohol abuse/. Alcohol withdrawal syndrome ([place]) -history of severe alcohol withdrawal in the past we will continue to monitor in stepdown order CIWA protocol. Order thiamine Evaluate for alcoholic ketoacidosis Check beta-hydroxybutiric acid Administer IV thiamin prior to any glucose solution   . Hyponatremia -obtain urine electrolytes likely secondary to beer hypotomania as well as dehydration. Will follow sodium levels Given hypotension we will gently rehydrate  . Hypokalemia -will replace monitor on telemetry   . Anemia of chronic disease -obtain anemia panel B12 appears to be within norm  . Abnormal liver function tests -in the setting of alcohol abuse   . Hypomagnesemia -will replace    . Hypophosphatemia -will replace  . Left arm swelling -felt to be secondary to IV fluid extravasation possible potassium present most likely 1 run of KCl in 1 L of LR.  Good capillary refill soft compartments no evidence of compartment syndrome at this time we will continue to monitor neutrophilic frequent neurovascular checks if significant changes noted may need orthopedic consult   Other plan as per  orders.  DVT prophylaxis:    Lovenox     Code Status:  FULL CODE   as per patient  I had personally discussed CODE STATUS with patient  Family Communication:   Ex-wife  at  Bedside    Disposition Plan:    To home once workup is complete and patient is stable   Following barriers for discharge:                            Electrolytes corrected                                                            white count improving                               Will need to be able to tolerate PO                            Will likely need home health,                                                Would benefit from PT/OT eval prior to DC  Ordered                      Transition of care consulted                   Nutrition    consulted  Consults called: none   Admission status:  ED Disposition    ED Disposition Condition [facility] Area: [place] [[id number]]  Level of Care: Stepdown [14]  Admit to SDU based on following criteria: Hemodynamic compromise or significant risk of instability:  Patient requiring short term acute titration and management of vasoactive drips, and invasive monitoring (i.e., CVP and Arterial line).  Admit to SDU based on following criteria: Severe physiological/psychological symptoms:  Any diagnosis requiring assessment & intervention at least every 4 hours on an ongoing basis to obtain desired patient outcomes including stability and rehabilitation  Covid Evaluation: Symptomatic Person Under Investigation (PUI)  Diagnosis: Alcohol withdrawal syndrome ([place]) [id number]  Admitting Physician: Toy Baker [[id number]  Attending Physician: Toy Baker [[id number]]       Obs      Level of care      SDU tele indefinitely please discontinue once patient no longer qualifies   Precautions: admitted as PUI  No active isolations  If Covid PCR is negative  - please DC precautions     PPE:  Used by the provider:   P100  eye Goggles,  Gloves     Mayling Aber 04/19/2019, 2:30 AM    [facility]     after 2 AM please page floor coverage PA If 7AM-7PM, please contact the day team taking care of the patient using [email or web address]   Patient was evaluated in the context of the global COVID-19 pandemic, which necessitated consideration that the patient might be at risk for infection with the SARS-CoV-2 virus that causes COVID-19. Institutional protocols and algorithms that pertain to the evaluation of patients at risk for COVID-19 are in a state of rapid change based on information released by regulatory bodies including the CDC and federal and state organizations. These policies and algorithms were followed during the patient's care.

## 2019-04-19 DIAGNOSIS — F10239 Alcohol dependence with withdrawal, unspecified: Secondary | ICD-10-CM | POA: Diagnosis present

## 2019-04-19 DIAGNOSIS — F10230 Alcohol dependence with withdrawal, uncomplicated: Secondary | ICD-10-CM | POA: Diagnosis present

## 2019-04-19 DIAGNOSIS — E876 Hypokalemia: Secondary | ICD-10-CM | POA: Diagnosis present

## 2019-04-19 DIAGNOSIS — E860 Dehydration: Secondary | ICD-10-CM | POA: Diagnosis present

## 2019-04-19 DIAGNOSIS — M7989 Other specified soft tissue disorders: Secondary | ICD-10-CM | POA: Diagnosis present

## 2019-04-19 DIAGNOSIS — I10 Essential (primary) hypertension: Secondary | ICD-10-CM | POA: Diagnosis present

## 2019-04-19 DIAGNOSIS — R945 Abnormal results of liver function studies: Secondary | ICD-10-CM | POA: Diagnosis not present

## 2019-04-19 DIAGNOSIS — I959 Hypotension, unspecified: Secondary | ICD-10-CM | POA: Diagnosis present

## 2019-04-19 DIAGNOSIS — E519 Thiamine deficiency, unspecified: Secondary | ICD-10-CM | POA: Diagnosis present

## 2019-04-19 DIAGNOSIS — D696 Thrombocytopenia, unspecified: Secondary | ICD-10-CM | POA: Diagnosis present

## 2019-04-19 DIAGNOSIS — D638 Anemia in other chronic diseases classified elsewhere: Secondary | ICD-10-CM | POA: Diagnosis present

## 2019-04-19 DIAGNOSIS — Z803 Family history of malignant neoplasm of breast: Secondary | ICD-10-CM | POA: Diagnosis not present

## 2019-04-19 DIAGNOSIS — F1010 Alcohol abuse, uncomplicated: Secondary | ICD-10-CM | POA: Diagnosis not present

## 2019-04-19 DIAGNOSIS — Z6820 Body mass index (BMI) 20.0-20.9, adult: Secondary | ICD-10-CM | POA: Diagnosis not present

## 2019-04-19 DIAGNOSIS — F17210 Nicotine dependence, cigarettes, uncomplicated: Secondary | ICD-10-CM | POA: Diagnosis present

## 2019-04-19 DIAGNOSIS — Z20822 Contact with and (suspected) exposure to covid-19: Secondary | ICD-10-CM | POA: Diagnosis present

## 2019-04-19 DIAGNOSIS — F10939 Alcohol use, unspecified with withdrawal, unspecified: Secondary | ICD-10-CM | POA: Diagnosis present

## 2019-04-19 DIAGNOSIS — Z96643 Presence of artificial hip joint, bilateral: Secondary | ICD-10-CM | POA: Diagnosis present

## 2019-04-19 DIAGNOSIS — E440 Moderate protein-calorie malnutrition: Secondary | ICD-10-CM | POA: Diagnosis present

## 2019-04-19 DIAGNOSIS — I471 Supraventricular tachycardia: Secondary | ICD-10-CM | POA: Diagnosis present

## 2019-04-19 DIAGNOSIS — K7010 Alcoholic hepatitis without ascites: Secondary | ICD-10-CM | POA: Diagnosis present

## 2019-04-19 DIAGNOSIS — E871 Hypo-osmolality and hyponatremia: Secondary | ICD-10-CM | POA: Diagnosis present

## 2019-04-19 LAB — COMPREHENSIVE METABOLIC PANEL
ALT: 34 U/L (ref 0–44)
AST: 79 U/L — ABNORMAL HIGH (ref 15–41)
Albumin: 3.6 g/dL (ref 3.5–5.0)
Alkaline Phosphatase: 145 U/L — ABNORMAL HIGH (ref 38–126)
Anion gap: 21 — ABNORMAL HIGH (ref 5–15)
BUN: 13 mg/dL (ref 6–20)
CO2: 26 mmol/L (ref 22–32)
Calcium: 8.4 mg/dL — ABNORMAL LOW (ref 8.9–10.3)
Chloride: 80 mmol/L — ABNORMAL LOW (ref 98–111)
Creatinine, Ser: 0.68 mg/dL (ref 0.61–1.24)
GFR calc Af Amer: >60 mL/min (ref >60)
GFR calc non Af Amer: >60 mL/min (ref >60)
Glucose, Bld: 106 mg/dL — ABNORMAL HIGH (ref 70–99)
Potassium: 2 mmol/L — CL (ref 3.5–5.1)
Sodium: 127 mmol/L — ABNORMAL LOW (ref 135–145)
Total Bilirubin: 2.3 mg/dL — ABNORMAL HIGH (ref 0.3–1.2)
Total Protein: 7.4 g/dL (ref 6.5–8.1)

## 2019-04-19 LAB — BASIC METABOLIC PANEL
Anion gap: 19 — ABNORMAL HIGH (ref 5–15)
Anion gap: 21 — ABNORMAL HIGH (ref 5–15)
Anion gap: 12 (ref 5–15)
BUN: 13 mg/dL (ref 6–20)
BUN: 13 mg/dL (ref 6–20)
BUN: 11 mg/dL (ref 6–20)
CO2: 26 mmol/L (ref 22–32)
CO2: 28 mmol/L (ref 22–32)
CO2: 28 mmol/L (ref 22–32)
Calcium: 8.2 mg/dL — ABNORMAL LOW (ref 8.9–10.3)
Calcium: 8.4 mg/dL — ABNORMAL LOW (ref 8.9–10.3)
Calcium: 7.3 mg/dL — ABNORMAL LOW (ref 8.9–10.3)
Chloride: 80 mmol/L — ABNORMAL LOW (ref 98–111)
Chloride: 80 mmol/L — ABNORMAL LOW (ref 98–111)
Chloride: 85 mmol/L — ABNORMAL LOW (ref 98–111)
Creatinine, Ser: 0.63 mg/dL (ref 0.61–1.24)
Creatinine, Ser: 0.69 mg/dL (ref 0.61–1.24)
Creatinine, Ser: 0.55 mg/dL — ABNORMAL LOW (ref 0.61–1.24)
GFR calc Af Amer: >60 mL/min (ref >60)
GFR calc Af Amer: >60 mL/min (ref >60)
GFR calc Af Amer: >60 mL/min (ref >60)
GFR calc non Af Amer: >60 mL/min (ref >60)
GFR calc non Af Amer: >60 mL/min (ref >60)
GFR calc non Af Amer: >60 mL/min (ref >60)
Glucose, Bld: 108 mg/dL — ABNORMAL HIGH (ref 70–99)
Glucose, Bld: 109 mg/dL — ABNORMAL HIGH (ref 70–99)
Glucose, Bld: 104 mg/dL — ABNORMAL HIGH (ref 70–99)
Potassium: 2 mmol/L — CL (ref 3.5–5.1)
Potassium: 2 mmol/L — CL (ref 3.5–5.1)
Potassium: 2.7 mmol/L — CL (ref 3.5–5.1)
Sodium: 127 mmol/L — ABNORMAL LOW (ref 135–145)
Sodium: 127 mmol/L — ABNORMAL LOW (ref 135–145)
Sodium: 125 mmol/L — ABNORMAL LOW (ref 135–145)

## 2019-04-19 LAB — HEPATITIS PANEL, ACUTE
HCV Ab: NONREACTIVE
Hep A IgM: NONREACTIVE
Hep B C IgM: NONREACTIVE
Hepatitis B Surface Ag: NONREACTIVE

## 2019-04-19 LAB — URINALYSIS, ROUTINE W REFLEX MICROSCOPIC
Bilirubin Urine: NEGATIVE
Glucose, UA: NEGATIVE mg/dL
Hgb urine dipstick: NEGATIVE
Ketones, ur: 20 mg/dL — AB
Leukocytes,Ua: NEGATIVE
Nitrite: NEGATIVE
Protein, ur: 30 mg/dL — AB
Specific Gravity, Urine: 1.014 (ref 1.005–1.030)
pH: 6 (ref 5.0–8.0)

## 2019-04-19 LAB — CBC WITH DIFFERENTIAL/PLATELET
Abs Immature Granulocytes: 0.07 10*3/uL (ref 0.00–0.07)
Basophils Absolute: 0.1 10*3/uL (ref 0.0–0.1)
Basophils Relative: 1 %
Eosinophils Absolute: 0 10*3/uL (ref 0.0–0.5)
Eosinophils Relative: 0 %
HCT: 31.9 % — ABNORMAL LOW (ref 39.0–52.0)
Hemoglobin: 11.2 g/dL — ABNORMAL LOW (ref 13.0–17.0)
Immature Granulocytes: 1 %
Lymphocytes Relative: 12 %
Lymphs Abs: 1.3 10*3/uL (ref 0.7–4.0)
MCH: 36.6 pg — ABNORMAL HIGH (ref 26.0–34.0)
MCHC: 35.1 g/dL (ref 30.0–36.0)
MCV: 104.2 fL — ABNORMAL HIGH (ref 80.0–100.0)
Monocytes Absolute: 0.8 10*3/uL (ref 0.1–1.0)
Monocytes Relative: 7 %
Neutro Abs: 8.9 10*3/uL — ABNORMAL HIGH (ref 1.7–7.7)
Neutrophils Relative %: 79 %
Platelets: 299 10*3/uL (ref 150–400)
RBC: 3.06 MIL/uL — ABNORMAL LOW (ref 4.22–5.81)
RDW: 12.6 % (ref 11.5–15.5)
WBC: 11.2 10*3/uL — ABNORMAL HIGH (ref 4.0–10.5)
nRBC: 0 % (ref 0.0–0.2)

## 2019-04-19 LAB — PHOSPHORUS
Phosphorus: 1 mg/dL — CL (ref 2.5–4.6)
Phosphorus: <1 mg/dL — CL (ref 2.5–4.6)
Phosphorus: <1 mg/dL — CL (ref 2.5–4.6)

## 2019-04-19 LAB — RAPID URINE DRUG SCREEN, HOSP PERFORMED
Amphetamines: NOT DETECTED
Barbiturates: NOT DETECTED
Benzodiazepines: NOT DETECTED
Cocaine: NOT DETECTED
Opiates: NOT DETECTED
Tetrahydrocannabinol: NOT DETECTED

## 2019-04-19 LAB — IRON AND TIBC
Iron: 58 ug/dL (ref 45–182)
Saturation Ratios: 29 % (ref 17.9–39.5)
TIBC: 200 ug/dL — ABNORMAL LOW (ref 250–450)
UIBC: 142 ug/dL

## 2019-04-19 LAB — HIV ANTIBODY (ROUTINE TESTING W REFLEX): HIV Screen 4th Generation wRfx: NONREACTIVE

## 2019-04-19 LAB — MRSA PCR SCREENING: MRSA by PCR: NEGATIVE

## 2019-04-19 LAB — FOLATE: Folate: 13.2 ng/mL (ref >5.9)

## 2019-04-19 LAB — OSMOLALITY, URINE: Osmolality, Ur: 309 mOsm/kg (ref 300–900)

## 2019-04-19 LAB — RETICULOCYTES
Immature Retic Fract: 21.8 % — ABNORMAL HIGH (ref 2.3–15.9)
RBC.: 2.9 MIL/uL — ABNORMAL LOW (ref 4.22–5.81)
Retic Count, Absolute: 126.1 10*3/uL (ref 19.0–186.0)
Retic Ct Pct: 4.4 % — ABNORMAL HIGH (ref 0.4–3.1)

## 2019-04-19 LAB — LACTIC ACID, PLASMA: Lactic Acid, Venous: 1.6 mmol/L (ref 0.5–1.9)

## 2019-04-19 LAB — CREATININE, URINE, RANDOM: Creatinine, Urine: 239.24 mg/dL

## 2019-04-19 LAB — SODIUM, URINE, RANDOM: Sodium, Ur: <10 mmol/L

## 2019-04-19 LAB — BETA-HYDROXYBUTYRIC ACID
Beta-Hydroxybutyric Acid: 4.31 mmol/L — ABNORMAL HIGH (ref 0.05–0.27)
Beta-Hydroxybutyric Acid: 3.92 mmol/L — ABNORMAL HIGH (ref 0.05–0.27)

## 2019-04-19 LAB — PROTIME-INR
INR: 1.2 (ref 0.8–1.2)
Prothrombin Time: 14.6 seconds (ref 11.4–15.2)

## 2019-04-19 LAB — FERRITIN: Ferritin: 303 ng/mL (ref 24–336)

## 2019-04-19 LAB — TSH: TSH: 3.511 u[IU]/mL (ref 0.350–4.500)

## 2019-04-19 LAB — MAGNESIUM: Magnesium: 1.7 mg/dL (ref 1.7–2.4)

## 2019-04-19 LAB — VITAMIN B12: Vitamin B-12: 644 pg/mL (ref 180–914)

## 2019-04-19 LAB — SARS CORONAVIRUS 2 (TAT 6-24 HRS): SARS Coronavirus 2: NEGATIVE

## 2019-04-19 LAB — OSMOLALITY: Osmolality: 265 mOsm/kg — ABNORMAL LOW (ref 275–295)

## 2019-04-19 MED ORDER — LORAZEPAM 2 MG/ML IJ SOLN: 1.0000 mg | INTRAMUSCULAR | Status: DC | PRN

## 2019-04-19 MED ORDER — LORAZEPAM 1 MG PO TABS: 1.0000 mg | ORAL_TABLET | ORAL | Status: DC | PRN

## 2019-04-19 MED ORDER — BOOST / RESOURCE BREEZE PO LIQD CUSTOM
1.0000 | Freq: Two times a day (BID) | ORAL | Status: DC
  Administered 2019-04-19 – 2019-04-23 (×8): 1 via ORAL

## 2019-04-19 MED ORDER — POTASSIUM CHLORIDE 10 MEQ/100ML IV SOLN
10.0000 meq | INTRAVENOUS | Status: AC
  Administered 2019-04-19 (×5): 10 meq via INTRAVENOUS
  Filled 2019-04-19 (×4): qty 100

## 2019-04-19 MED ORDER — ONDANSETRON HCL 4 MG/2ML IJ SOLN: 4.0000 mg | Freq: Four times a day (QID) | INTRAMUSCULAR | Status: DC | PRN

## 2019-04-19 MED ORDER — ENSURE ENLIVE PO LIQD
237.0000 mL | ORAL | Status: DC
  Administered 2019-04-20 – 2019-04-22 (×3): 237 mL via ORAL

## 2019-04-19 MED ORDER — SODIUM CHLORIDE 0.9 % IV SOLN: INTRAVENOUS | Status: DC

## 2019-04-19 MED ORDER — THIAMINE HCL 100 MG PO TABS
100.0000 mg | ORAL_TABLET | Freq: Every day | ORAL | Status: DC
  Administered 2019-04-19 – 2019-04-22 (×3): 100 mg via ORAL
  Filled 2019-04-19 (×3): qty 1

## 2019-04-19 MED ORDER — SODIUM CHLORIDE 0.9% FLUSH
3.0000 mL | Freq: Two times a day (BID) | INTRAVENOUS | Status: DC
  Administered 2019-04-19 – 2019-04-22 (×8): 3 mL via INTRAVENOUS

## 2019-04-19 MED ORDER — CHLORHEXIDINE GLUCONATE CLOTH 2 % EX PADS
6.0000 | MEDICATED_PAD | Freq: Every day | CUTANEOUS | Status: DC
  Administered 2019-04-19 – 2019-04-23 (×5): 6 via TOPICAL

## 2019-04-19 MED ORDER — MAGNESIUM SULFATE 2 GM/50ML IV SOLN
2.0000 g | Freq: Once | INTRAVENOUS | Status: AC
  Administered 2019-04-19: 14:00:00 2 g via INTRAVENOUS
  Filled 2019-04-19: qty 50

## 2019-04-19 MED ORDER — THIAMINE HCL 100 MG/ML IJ SOLN
100.0000 mg | Freq: Every day | INTRAMUSCULAR | Status: DC
  Administered 2019-04-21 – 2019-04-23 (×2): 100 mg via INTRAVENOUS
  Filled 2019-04-19: qty 2

## 2019-04-19 MED ORDER — POTASSIUM CHLORIDE 10 MEQ/100ML IV SOLN
10.0000 meq | INTRAVENOUS | Status: AC
  Administered 2019-04-19 (×5): 10 meq via INTRAVENOUS
  Filled 2019-04-19 (×5): qty 100

## 2019-04-19 MED ORDER — FOLIC ACID 1 MG PO TABS
1.0000 mg | ORAL_TABLET | Freq: Every day | ORAL | Status: DC
  Administered 2019-04-19 – 2019-04-23 (×5): 1 mg via ORAL
  Filled 2019-04-19 (×5): qty 1

## 2019-04-19 MED ORDER — ENSURE ENLIVE PO LIQD
237.0000 mL | Freq: Two times a day (BID) | ORAL | Status: DC
  Administered 2019-04-19: 11:00:00 237 mL via ORAL

## 2019-04-19 MED ORDER — DOCUSATE SODIUM 100 MG PO CAPS
100.0000 mg | ORAL_CAPSULE | Freq: Two times a day (BID) | ORAL | Status: DC
  Administered 2019-04-20 – 2019-04-23 (×5): 100 mg via ORAL
  Filled 2019-04-19 (×7): qty 1

## 2019-04-19 MED ORDER — POTASSIUM & SODIUM PHOSPHATES 280-160-250 MG PO PACK
1.0000 | PACK | Freq: Three times a day (TID) | ORAL | Status: DC
  Administered 2019-04-19 – 2019-04-22 (×13): 1 via ORAL
  Filled 2019-04-19 (×18): qty 1

## 2019-04-19 MED ORDER — MAGNESIUM OXIDE 400 (241.3 MG) MG PO TABS
400.0000 mg | ORAL_TABLET | Freq: Two times a day (BID) | ORAL | Status: DC
  Administered 2019-04-19 – 2019-04-23 (×9): 400 mg via ORAL
  Filled 2019-04-19 (×9): qty 1

## 2019-04-19 MED ORDER — ADULT MULTIVITAMIN W/MINERALS CH
1.0000 | ORAL_TABLET | Freq: Every day | ORAL | Status: DC
  Administered 2019-04-19 – 2019-04-23 (×5): 1 via ORAL
  Filled 2019-04-19 (×5): qty 1

## 2019-04-19 MED ORDER — ONDANSETRON HCL 4 MG PO TABS: 4.0000 mg | ORAL_TABLET | Freq: Four times a day (QID) | ORAL | Status: DC | PRN

## 2019-04-19 MED ORDER — ENOXAPARIN SODIUM 40 MG/0.4ML ~~LOC~~ SOLN
40.0000 mg | Freq: Every day | SUBCUTANEOUS | Status: DC
  Administered 2019-04-19 – 2019-04-23 (×5): 40 mg via SUBCUTANEOUS
  Filled 2019-04-19 (×5): qty 0.4

## 2019-04-19 MED ORDER — SODIUM PHOSPHATES 45 MMOLE/15ML IV SOLN
30.0000 mmol | Freq: Once | INTRAVENOUS | Status: AC
  Administered 2019-04-19: 30 mmol via INTRAVENOUS
  Filled 2019-04-19: qty 10

## 2019-04-19 MED ORDER — POTASSIUM PHOSPHATES 15 MMOLE/5ML IV SOLN
10.0000 mmol | Freq: Once | INTRAVENOUS | Status: AC
  Administered 2019-04-19: 10 mmol via INTRAVENOUS
  Filled 2019-04-19: qty 3.33

## 2019-04-19 MED ORDER — POTASSIUM CHLORIDE CRYS ER 20 MEQ PO TBCR: 40.0000 meq | EXTENDED_RELEASE_TABLET | Freq: Every day | ORAL | Status: DC

## 2019-04-19 MED ORDER — POTASSIUM CHLORIDE CRYS ER 20 MEQ PO TBCR
40.0000 meq | EXTENDED_RELEASE_TABLET | Freq: Two times a day (BID) | ORAL | Status: DC
  Administered 2019-04-19 – 2019-04-20 (×3): 40 meq via ORAL
  Filled 2019-04-19 (×3): qty 2

## 2019-04-19 MED ORDER — SODIUM CHLORIDE 0.9 % IV BOLUS
500.0000 mL | Freq: Once | INTRAVENOUS | Status: AC
  Administered 2019-04-19: 500 mL via INTRAVENOUS

## 2019-04-19 MED ORDER — DEXTROSE-NACL 5-0.45 % IV SOLN: INTRAVENOUS | Status: DC

## 2019-04-19 NOTE — Progress Notes (Signed)
Initial Nutrition Assessment  DOCUMENTATION CODES:   Non-severe (moderate) malnutrition in context of social or environmental circumstances  INTERVENTION:  - will order Ensure Enlive once/day, each supplement provides 350 kcal and 20 grams of protein. - will order Boost Breeze BID, each supplement provides 250 kcal and 9 grams of protein. - continue to encourage PO intakes.    NUTRITION DIAGNOSIS:   Moderate Malnutrition related to social / environmental circumstances(alcohol abuse) as evidenced by mild fat depletion, mild muscle depletion, moderate muscle depletion.  GOAL:   Patient will meet greater than or equal to 90% of their needs  MONITOR:   PO intake, Supplement acceptance, Labs, Weight trends  REASON FOR ASSESSMENT:   Malnutrition Screening Tool, Consult Malnutrition Eval  ASSESSMENT:   49 y.o. male with medical history significant of ETOH abuse. He presented to the ED with generalized weakness, lightheadedness, and frequent falls at home. He has not been eating well and was drinking up until 2 days PTA. He reported having a hx of severe alcohol withdrawal in the past to include seizures.  No intakes documented since admission. Patient reports eating 100% of breakfast this AM: fruit cup, [place] sausage, and scrambled eggs. He denies any chewing or swallowing difficulties. He denies any abdominal pain/pressure or nausea following breakfast or in the time PTA.  He states that he used to drink 1 gallon of vodka/week; either straight or with water. An unknown amount of time ago, he cut down to 1/2 gallon/week. He denies having any withdrawal symptoms after making this change but then states the was laid off at work d/t shakiness.   He reports PTA he was not eating well or not eating much. He would often have a breakfast biscuit sandwich and some days this would be all he ate. He does not cook and lives alone so all foods are easy to prepare/need no preparation or are  purchased at [employer].   Per chart review, weight yesterday was 141 lb and PTA the last documented weight was on 11/29/16 when he weighed 143 lb.    Labs reviewed; Na: 127 mmol/l, K: 2 mmol/l, Cl: 90 mmol/l, Ca: 8.4 mg/dl, Phos: <1 mg/dl, AST elevated. Medications reviewed; 100 mg colace BID, 1 mg folvite/day, 400 mg mag-ox BID, 1 g IV Mg sulfate x1 run 4/16, 2 g IV Mg sulfate x2 run 4/16, daily multivitamin with minerals, 1 packet Phos-nak TID, 10 mEq IV KCl x8 runs 4/16, 40 mEq Klor-Con BID, 10 mmol IV KPhos x1 run 4/16, 100 mg thiamine/day. IVF; NS @ 100 ml/hr.    NUTRITION - FOCUSED PHYSICAL EXAM:    Most Recent Value  Orbital Region  Mild depletion  Upper Arm Region  Mild depletion  Thoracic and Lumbar Region  Unable to assess  Buccal Region  Mild depletion  Temple Region  Mild depletion  Clavicle Bone Region  Moderate depletion  Clavicle and Acromion Bone Region  Moderate depletion  Scapular Bone Region  Unable to assess  Dorsal Hand  Unable to assess  Patellar Region  Unable to assess  Anterior Thigh Region  Unable to assess  Edema (RD Assessment)  Unable to assess  Hair  Reviewed  Eyes  Reviewed  Mouth  Reviewed  Skin  Reviewed  Nails  Reviewed       Diet Order:   Diet Order            Diet Heart Room service appropriate? Yes; Fluid consistency: Thin  Diet effective now  EDUCATION NEEDS:   Not appropriate for education at this time  Skin:  Skin Assessment: Reviewed RN Assessment  Last BM:  4/13  Height:   Ht Readings from Last 1 Encounters:  04/18/19 5\' 9"  (1.753 m)    Weight:   Wt Readings from Last 1 Encounters:  04/18/19 64 kg    Ideal Body Weight:  72.7 kg  BMI:  Body mass index is 20.82 kg/m.  Estimated Nutritional Needs:   Kcal:  1920-2175 kcal  Protein:  90-105 grams  Fluid:  >/= 2 L/day     Jarome Matin, MS, RD, LDN, CNSC Inpatient Clinical Dietitian RD pager # available in AMION  After hours/weekend  pager # available in [facility]

## 2019-04-19 NOTE — Progress Notes (Addendum)
PROGRESS NOTE  Eugene Jackson [id number] DOB: 07-08-1970 DOA: 04/18/2019 PCP: [name], MD   LOS: 0 days   Brief narrative: As per HPI,  Eugene Jackson is a 49 y.o. male with medical history significant of ETOH abuse  presented to the hospital with generalized weakness, lightheadedness had been falling a lot at home.  Patient has not been eating well, has been mainly drinking until 2 days ago, reports hx of severe EtOH withdrawal with seizures in the past. History of heavy alcohol abuse and noncompliance in 2014 has been hospitalized with pneumonia. Patient has had admissions in the past with multiple electrolyte abnormalities and a low vitamin B12 levels.  Patient was then admitted to the hospital for further evaluation and treatment.   Assessment/Plan:  Active Problems:   Alcohol abuse   Hyponatremia   Hypokalemia   Anemia of chronic disease   Abnormal liver function tests   Hypomagnesemia   Macrocytic anemia   Alcohol withdrawal syndrome (HCC)   Hypophosphatemia   Left arm swelling  Alcohol abuse/mild Alcohol withdrawal syndrome-patient does have history of severe alcohol withdrawal in the past.  Currently at the stepdown unit.  Has been having multiple electrolyte imbalances clearing hypokalemia, hypophosphatemia..  Continue thiamine.  Continue CIWA protocol.    Mild hypotension.  Likely volume depletion.  Will give normal saline bolus.  Continue gentle IV fluid hydration.  Reassess in a.m.  Hyponatremia -could be secondary to beer potomania.  Urine osmolality of 309.  TSH within normal limits.  Hypokalemia -severe.  We will continue to replenish with IV KCl.  Add p.o. KCl.  Check BMP in p.m. and replenish as necessary.   Anemia of chronic disease -vitamin B12 within normal limits.  Abnormal liver function tests -mildly elevated AST likely secondary to alcohol abuse.  Will monitor LFTs.   Hypomagnesemia -improved.  Magnesium level of 1.7.  Will give an  additional dose of IV magnesium sulfate 2 g.  Check magnesium level in a.m.  Hypophosphatemia -phosphate  <1.0.  Patient received sodium phosphate.  Will replenish further doses in the evening.  Check phosphate level in a.m.  Left arm swelling  will monitor, likely secondary to IV extravasation.  Supportive care.  Nicotine use disorder.  We will continue with nicotine patch.  Moderate protein calorie malnutrition.  Nutrition on board.  Continue nutritional supplements.   VTE Prophylaxis: Lovenox subcu  Code Status: Full code  Family Communication: None today  Status is: Observation  The patient will require care spanning > 2 midnights and should be moved to inpatient because: Inpatient level of care appropriate due to severity of illness, need for IV electrolytes, significant electrolyte imbalances, further monitoring for alcohol withdrawal  Dispo: The patient is from: Home              Anticipated d/c is to: Home              Anticipated d/c date is: 2 days              Patient currently is not medically stable to d/c.  Will get PT OT evaluation prior to discharge  Consultants:  None  Procedures:  None  Antibiotics:  . None  Anti-infectives (From admission, onward)   None     Subjective: Today, patient was seen and examined at bedside.  Patient denies withdrawal symptoms including hallucination.  No vomiting, shortness of breath cough or fever.  Nursing staff reported multiple electrolyte issues and low blood pressure  Objective: Vitals:   04/19/19 0900 04/19/19 1000  BP: (!) 84/63 (!) 120/99  Pulse: 87 88  Resp: (!) 24 15  Temp:    SpO2: 100% 90%    Intake/Output Summary (Last 24 hours) at 04/19/2019 1106 Last data filed at 04/19/2019 0625 Gross per 24 hour  Intake 1969.44 ml  Output 600 ml  Net 1369.44 ml   Filed Weights   04/18/19 1927  Weight: 64 kg   Body mass index is 20.82 kg/m.   Physical Exam: GENERAL: Patient is alert awake and oriented.  Not in obvious distress.  Thinly built HENT: No scleral pallor or icterus. Pupils equally reactive to light. Oral mucosa is moist NECK: is supple, no gross swelling noted. CHEST: Clear to auscultation. No crackles or wheezes.  Diminished breath sounds bilaterally. CVS: S1 and S2 heard, no murmur. Regular rate and rhythm.  ABDOMEN: Soft, non-tender, bowel sounds are present. EXTREMITIES: No edema. CNS: Cranial nerves are intact. No focal motor deficits. SKIN: warm and dry without rashes.  Data Review: I have personally reviewed the following laboratory data and studies,  CBC: Recent Labs  Lab 04/18/19 1943 04/19/19 0247  WBC 13.7* 11.2*  NEUTROABS  --  8.9*  HGB 12.2* 11.2*  HCT 34.5* 31.9*  MCV 104.2* 104.2*  PLT 368 [id number]   Basic Metabolic Panel: Recent Labs  Lab 04/18/19 2044 04/18/19 2101 04/19/19 0019 04/19/19 0247  NA 125*  --   --  127*  127*  127*  K 2.2*  --   --  2.0*  2.0*  2.0*  CL 74*  --   --  80*  80*  80*  CO2 29  --   --  26  26  28   GLUCOSE 122*  --   --  106*  109*  108*  BUN 14  --   --  13  13  13   CREATININE 0.83  --   --  0.68  0.63  0.69  CALCIUM 8.1*  --   --  8.4*  8.4*  8.2*  MG  --  1.6*  --  1.7  PHOS  --   --  1.0* <1.0*   Liver Function Tests: Recent Labs  Lab 04/18/19 2044 04/19/19 0247  AST 86* 79*  ALT 33 34  ALKPHOS 139* 145*  BILITOT 2.9* 2.3*  PROT 7.5 7.4  ALBUMIN 3.4* 3.6   No results for input(s): LIPASE, AMYLASE in the last 168 hours. Recent Labs  Lab 04/18/19 2120  AMMONIA 64*   Cardiac Enzymes: No results for input(s): CKTOTAL, CKMB, CKMBINDEX, TROPONINI in the last 168 hours. BNP (last 3 results) No results for input(s): BNP in the last 8760 hours.  ProBNP (last 3 results) No results for input(s): PROBNP in the last 8760 hours.  CBG: No results for input(s): GLUCAP in the last 168 hours. Recent Results (from the past 240 hour(s))  SARS CORONAVIRUS 2 (TAT 6-24 HRS) Nasopharyngeal  Nasopharyngeal Swab     Status: None   Collection Time: 04/18/19  9:12 PM   Specimen: Nasopharyngeal Swab  Result Value Ref Range Status   SARS Coronavirus 2 NEGATIVE NEGATIVE Final    Comment: (NOTE) SARS-CoV-2 target nucleic acids are NOT DETECTED. The SARS-CoV-2 RNA is generally detectable in upper and lower respiratory specimens during the acute phase of infection. Negative results do not preclude SARS-CoV-2 infection, do not rule out co-infections with other pathogens, and should not be used as the sole basis for treatment or other patient  management decisions. Negative results must be combined with clinical observations, patient history, and epidemiological information. The expected result is Negative. Fact Sheet for Patients: SugarRoll.be Fact Sheet for Healthcare Providers: https://www.woods-mathews.com/ This test is not yet approved or cleared by the [place] FDA and  has been authorized for detection and/or diagnosis of SARS-CoV-2 by FDA under an Emergency Use Authorization (EUA). This EUA will remain  in effect (meaning this test can be used) for the duration of the COVID-19 declaration under Section 56 4(b)(1) of the Act, 21 U.S.C. section 360bbb-3(b)(1), unless the authorization is terminated or revoked sooner. Performed at [facility], [place] [address] [place], [place] [address]   MRSA PCR Screening     Status: None   Collection Time: 04/19/19  2:02 AM   Specimen: Nasal Mucosa; Nasopharyngeal  Result Value Ref Range Status   MRSA by PCR NEGATIVE NEGATIVE Final    Comment:        The GeneXpert MRSA Assay (FDA approved for NASAL specimens only), is one component of a comprehensive MRSA colonization surveillance program. It is not intended to diagnose MRSA infection nor to guide or monitor treatment for MRSA infections. Performed at [facility], [place] [address], [place], [place] [address]       Studies: CT Head Wo Contrast  Result Date: 04/18/2019 CLINICAL DATA:  Altered mental status, ETOH withdrawal EXAM: CT HEAD WITHOUT CONTRAST TECHNIQUE: Contiguous axial images were obtained from the base of the skull through the vertex without intravenous contrast. COMPARISON:  11/28/2016 FINDINGS: Brain: No evidence of acute infarction, hemorrhage, hydrocephalus, extra-axial collection or mass lesion/mass effect. Periventricular and deep white matter hypodensity. Vascular: No hyperdense vessel or unexpected calcification. Skull: Normal. Negative for fracture or focal lesion. Sinuses/Orbits: No acute finding. Other: None. IMPRESSION: No acute intracranial pathology.  Small-vessel white matter disease. Electronically Signed   By: Eddie Candle M.D.   On: 04/18/2019 23:29   DG Chest Portable 1 View  Result Date: 04/18/2019 CLINICAL DATA:  Altered mental status EXAM: PORTABLE CHEST 1 VIEW COMPARISON:  Radiograph 12/13/2016 FINDINGS: Stable region of scarring seen in the left lung base with the slight blunting of the costophrenic sulcus possibly related architectural distortion rather than effusion. No convincing features of acute consolidation or edema. No pneumothorax or sizable pleural effusion is evident. Pulmonary vascularity is normally distributed. The aorta is calcified. The remaining cardiomediastinal contours are unremarkable. No acute osseous or soft tissue abnormality. Degenerative changes are present in the imaged spine and shoulders. Telemetry leads overlie the chest. IMPRESSION: Stable scarring in the left lung base. No acute cardiopulmonary abnormality. Electronically Signed   By: Lovena Le M.D.   On: 04/18/2019 22:45      Flora Lipps, MD  [facility] 04/19/2019

## 2019-04-19 NOTE — ED Notes (Signed)
Date and time results received: 04/19/19 1:17 AM  Test: Phosphourous Critical Value: 1.0 Name of Provider Notified:Doutova, MD  Orders Received? Or Actions Taken?:

## 2019-04-20 LAB — COMPREHENSIVE METABOLIC PANEL
ALT: 26 U/L (ref 0–44)
AST: 60 U/L — ABNORMAL HIGH (ref 15–41)
Albumin: 2.3 g/dL — ABNORMAL LOW (ref 3.5–5.0)
Alkaline Phosphatase: 97 U/L (ref 38–126)
Anion gap: 8 (ref 5–15)
BUN: 9 mg/dL (ref 6–20)
CO2: 27 mmol/L (ref 22–32)
Calcium: 6.6 mg/dL — ABNORMAL LOW (ref 8.9–10.3)
Chloride: 97 mmol/L — ABNORMAL LOW (ref 98–111)
Creatinine, Ser: 0.58 mg/dL — ABNORMAL LOW (ref 0.61–1.24)
GFR calc Af Amer: >60 mL/min (ref >60)
GFR calc non Af Amer: >60 mL/min (ref >60)
Glucose, Bld: 112 mg/dL — ABNORMAL HIGH (ref 70–99)
Potassium: 2.6 mmol/L — CL (ref 3.5–5.1)
Sodium: 132 mmol/L — ABNORMAL LOW (ref 135–145)
Total Bilirubin: 1.3 mg/dL — ABNORMAL HIGH (ref 0.3–1.2)
Total Protein: 4.9 g/dL — ABNORMAL LOW (ref 6.5–8.1)

## 2019-04-20 LAB — BASIC METABOLIC PANEL WITH GFR
Anion gap: 11 (ref 5–15)
BUN: 8 mg/dL (ref 6–20)
CO2: 23 mmol/L (ref 22–32)
Calcium: 7 mg/dL — ABNORMAL LOW (ref 8.9–10.3)
Chloride: 102 mmol/L (ref 98–111)
Creatinine, Ser: 0.57 mg/dL — ABNORMAL LOW (ref 0.61–1.24)
GFR calc Af Amer: >60 mL/min
GFR calc non Af Amer: >60 mL/min
Glucose, Bld: 92 mg/dL (ref 70–99)
Potassium: 3.8 mmol/L (ref 3.5–5.1)
Sodium: 136 mmol/L (ref 135–145)

## 2019-04-20 LAB — CBC
HCT: 24.9 % — ABNORMAL LOW (ref 39.0–52.0)
Hemoglobin: 8.5 g/dL — ABNORMAL LOW (ref 13.0–17.0)
MCH: 37.4 pg — ABNORMAL HIGH (ref 26.0–34.0)
MCHC: 34.1 g/dL (ref 30.0–36.0)
MCV: 109.7 fL — ABNORMAL HIGH (ref 80.0–100.0)
Platelets: 215 10*3/uL (ref 150–400)
RBC: 2.27 MIL/uL — ABNORMAL LOW (ref 4.22–5.81)
RDW: 13.2 % (ref 11.5–15.5)
WBC: 7.1 10*3/uL (ref 4.0–10.5)
nRBC: 0.3 % — ABNORMAL HIGH (ref 0.0–0.2)

## 2019-04-20 LAB — CBC WITH DIFFERENTIAL/PLATELET
Abs Immature Granulocytes: 0.05 10*3/uL (ref 0.00–0.07)
Basophils Absolute: 0.1 10*3/uL (ref 0.0–0.1)
Basophils Relative: 1 %
Eosinophils Absolute: 0.1 10*3/uL (ref 0.0–0.5)
Eosinophils Relative: 1 %
HCT: 27.2 % — ABNORMAL LOW (ref 39.0–52.0)
Hemoglobin: 9.1 g/dL — ABNORMAL LOW (ref 13.0–17.0)
Immature Granulocytes: 1 %
Lymphocytes Relative: 19 %
Lymphs Abs: 1.3 10*3/uL (ref 0.7–4.0)
MCH: 37.4 pg — ABNORMAL HIGH (ref 26.0–34.0)
MCHC: 33.5 g/dL (ref 30.0–36.0)
MCV: 111.9 fL — ABNORMAL HIGH (ref 80.0–100.0)
Monocytes Absolute: 0.5 10*3/uL (ref 0.1–1.0)
Monocytes Relative: 7 %
Neutro Abs: 4.8 10*3/uL (ref 1.7–7.7)
Neutrophils Relative %: 71 %
Platelets: 220 10*3/uL (ref 150–400)
RBC: 2.43 MIL/uL — ABNORMAL LOW (ref 4.22–5.81)
RDW: 13.2 % (ref 11.5–15.5)
WBC: 6.8 10*3/uL (ref 4.0–10.5)
nRBC: 0.4 % — ABNORMAL HIGH (ref 0.0–0.2)

## 2019-04-20 LAB — PHOSPHORUS
Phosphorus: 1.2 mg/dL — ABNORMAL LOW (ref 2.5–4.6)
Phosphorus: <1 mg/dL — CL (ref 2.5–4.6)

## 2019-04-20 LAB — MAGNESIUM: Magnesium: 1.7 mg/dL (ref 1.7–2.4)

## 2019-04-20 MED ORDER — SODIUM PHOSPHATES 45 MMOLE/15ML IV SOLN
30.0000 mmol | Freq: Once | INTRAVENOUS | Status: AC
  Administered 2019-04-20: 30 mmol via INTRAVENOUS
  Filled 2019-04-20: qty 10

## 2019-04-20 MED ORDER — SODIUM CHLORIDE 0.9 % IV BOLUS
1000.0000 mL | Freq: Once | INTRAVENOUS | Status: AC
  Administered 2019-04-20: 01:00:00 1000 mL via INTRAVENOUS

## 2019-04-20 MED ORDER — POTASSIUM CHLORIDE 10 MEQ/100ML IV SOLN
10.0000 meq | INTRAVENOUS | Status: AC
  Administered 2019-04-20 (×5): 10 meq via INTRAVENOUS
  Filled 2019-04-20 (×5): qty 100

## 2019-04-20 MED ORDER — SODIUM CHLORIDE 0.9 % IV BOLUS
500.0000 mL | Freq: Once | INTRAVENOUS | Status: AC
  Administered 2019-04-20: 500 mL via INTRAVENOUS

## 2019-04-20 MED ORDER — ADENOSINE 6 MG/2ML IV SOLN
INTRAVENOUS | Status: AC
  Filled 2019-04-20: qty 2

## 2019-04-20 MED ORDER — ADENOSINE 6 MG/2ML IV SOLN
6.0000 mg | Freq: Once | INTRAVENOUS | Status: AC
  Administered 2019-04-20: 6 mg via INTRAVENOUS

## 2019-04-20 MED ORDER — SODIUM PHOSPHATES 45 MMOLE/15ML IV SOLN: 30.0000 mmol | Freq: Once | INTRAVENOUS | Status: DC

## 2019-04-20 NOTE — Progress Notes (Signed)
CRITICAL VALUE ALERT  Critical Value:  K 2.6  Date & Time Notied:  4/17 @ 03:31  Provider Notified: Bodenheimer  Orders Received/Actions taken: Awaiting orders

## 2019-04-20 NOTE — Significant Event (Signed)
Paged by bedside RN regarding pt being in SVT with rates in the 150's-170's. BP soft with systolic in the [id number]. Pt is A&O x 4 and having some slight presyncope. Given pts soft BP and presyncope he was given 6mg  of Adenosine rapid IV push. Defib pads in place and pt tolerated well. Converted to ST in the low 100's. Pt still hypotensive post Adenosine and a 1L NS bolus was ordered Upon exiting the room pt resting in bed.  Arby Barrette APRN-C [facility] Pager [phone]

## 2019-04-20 NOTE — Progress Notes (Signed)
Patients HR jumped to the 160s after being in NSR for length of stay. EKG obtained and read SVT with ST abnormality unspecified. Provider paged and recommended Adenosine push followed by a fluid bolus. With provider, Bodenheimer, at bedside, pads placed and adenosine pushed. HR stabilized in the 90s and 1L NS bolus started. Patient tolerated well. RN will continue to monitor the patient closely.

## 2019-04-20 NOTE — Progress Notes (Signed)
[facility]  PROGRESS NOTE  Eugene Jackson [id number] DOB: 1970-03-22 DOA: 04/18/2019 PCP: [name], MD Admit date - 04/18/2019   Admitting Physician Toy Baker, MD  Outpatient Primary MD for the patient is [name], MD  LOS - 1 Brief Narrative   Eugene Jackson is a 49 y.o. year old male with medical history significant for alcohol abuse with history of seizures/DTs in the past who presented on 04/18/2019 with generalized weakness, lightheadedness and falls at home as well as poor appetite in setting of labs alcohol intake 2 days prior to admission and was found to have tachycardia, hypophosphatemia, hypomagnesemia, hypokalemia, hyponatremia, hypochloremia admitted with diagnosis of active alcohol withdrawal in the setting of chronic alcohol abuse severe derangements.  Hospital course complicated by persistent severe hypophosphatemia, hypokalemia poorly responsive to IV repletion.  Subjective  Today has no acute complaints.  Does state has some slight tremors.  Overnight given IV adenosine x1 for heart rate in the 150s to 170s A & P   Tachycardic episode, currently rate controlled in normal sinus rhythm.  Reported SVT overnight.  Status post adenosine 6 mg x 1.  At high risk for arrhythmia giving significantly severe electrolyte disturbances. -Continue to monitor on telemetry -Correct electrolyte disturbances -If recurs obtain EKG-continue IV fluids  Alcohol withdrawal in setting of chronic alcohol abuse mild.  Reports tremors, none seen on exam, no visual/auditory hallucinations.  Still has multiple electrolyte imbalances.  Reports last drink greater than 48 hours ago.  Reports prior history of DTs/seizures -Closely monitor stepdown unit -Continue CIWA protocol, with Ativan -Folic acid, thiamine  Severe hypokalemia/hypomagnesemia/hypophosphatemia in setting of chronic alcohol abuse.  Likely deficiencies related to poor nutritional intake related  to alcohol abuse.  Potassium remains low at 2.7, phosphorus still barely above 1 and magnesium is close to normal at 1.7.  Electrolytes remain severely low despite IV repletion -Correct magnesium with IV repletion, continue daily oral magnesium -Correct hypophosphatemia with IV phosphate, continue scheduled phosphate -IV potassium, scheduled oral potassium as well -Closely monitor, at high risk for refeeding syndrome -Repeat BMP this pm  Nonsevere/moderate malnutrition eating of poor nutritional intake related to alcohol abuse. -Continue Ensure, boost breeze as recommended by nutrition  Anemia of chronic disease.  Labile baseline hemoglobin.  Around 12 on admission, suspect hemoconcentration related to dehydration.  Currently 8.  No signs symptoms of bleeding.  Iron, B12 within normal limits. -Monitor CBC.  Left arm swelling.  Secondary to IV extravasation, stable. -Continue to closely monitor, supportive care.  Hyponatremia, improving  Suspect beer potomania.  Nadir of 125, much better with IVF currently 132.  TSH within normal limits, patient was likely dehydrated on admission. -Continue IV fluids -Continue to encourage oral diet -Repeat BMP in a.m.  Thiamine deficiency, in setting of chronic alcohol abuse -Continue thiamine repletion  Elevated AST.  ALT, bilirubin within normal limits. -Continue to closely monitor  Altered mental status, resolved.  Alert and oriented x4.  CT head non-acute. Ammonia 64 on admission, several electrolyte derangements on admission     Family Communication  : None  Code Status : Full code  Disposition Plan  :  Patient is from home. Anticipated d/c date: 2 to 3 days. Barriers to d/c or necessity for inpatient status:  Needs continue IV repletion for severe electrolyte imbalances (potassium, sodium, phosphate, magnesium).  Needs continued close monitoring and IV Ativan for active withdrawal Consults  : None  Procedures  : None  DVT Prophylaxis  :  Lovenox   Lab Results  Component Value Date   PLT 220 04/20/2019    Diet :  Diet Order            Diet Heart Room service appropriate? Yes; Fluid consistency: Thin  Diet effective now               Inpatient Medications Scheduled Meds: . Chlorhexidine Gluconate Cloth  6 each Topical Daily  . docusate sodium  100 mg Oral BID  . enoxaparin (LOVENOX) injection  40 mg Subcutaneous Daily  . feeding supplement  1 Container Oral BID BM  . feeding supplement (ENSURE ENLIVE)  237 mL Oral Q24H  . folic acid  1 mg Oral Daily  . magnesium oxide  400 mg Oral BID  . multivitamin with minerals  1 tablet Oral Daily  . potassium & sodium phosphates  1 packet Oral TID WC & HS  . potassium chloride  40 mEq Oral BID  . sodium chloride flush  3 mL Intravenous Q12H  . thiamine injection  100 mg Intravenous Daily  . thiamine  100 mg Oral Daily   Or  . thiamine  100 mg Intravenous Daily   Continuous Infusions: . sodium chloride 100 mL/hr at 04/20/19 1200   PRN Meds:.LORazepam **OR** LORazepam, ondansetron **OR** ondansetron (ZOFRAN) IV  Antibiotics  :   Anti-infectives (From admission, onward)   None       Objective   Vitals:   04/20/19 1100 04/20/19 1200 04/20/19 1300 04/20/19 1400  BP: (!) 84/66   91/65  Pulse: 89 78  89  Resp: 14 (!) 24 (!) 23 14  Temp:      TempSrc:      SpO2: 100%   100%  Weight:      Height:        SpO2: 100 % O2 Flow Rate (L/min): 0 L/min FiO2 (%): 21 %  Wt Readings from Last 3 Encounters:  04/18/19 64 kg  11/29/16 65.1 kg  11/26/12 70.9 kg     Intake/Output Summary (Last 24 hours) at 04/20/2019 1525 Last data filed at 04/20/2019 1428 Gross per 24 hour  Intake 4111.74 ml  Output 1025 ml  Net 3086.74 ml    Physical Exam:    Chronically ill-appearing male Dry oral mucosa Awake Alert, Oriented X 3, Normal affect No new F.N deficits,  Parc.AT, Normal respiratory effort on room air,  CTAB RRR,No Gallops,Rubs or new Murmurs,  +ve  B.Sounds, Abd Soft, No tenderness, No rebound, guarding or rigidity.    I have personally reviewed the following:   Data Reviewed:  CBC Recent Labs  Lab 04/18/19 1943 04/19/19 0247 04/20/19 0236 04/20/19 0845  WBC 13.7* 11.2* 7.1 6.8  HGB 12.2* 11.2* 8.5* 9.1*  HCT 34.5* 31.9* 24.9* 27.2*  PLT 368 299 215 220  MCV 104.2* 104.2* 109.7* 111.9*  MCH 36.9* 36.6* 37.4* 37.4*  MCHC 35.4 35.1 34.1 33.5  RDW 12.7 12.6 13.2 13.2  LYMPHSABS  --  1.3  --  1.3  MONOABS  --  0.8  --  0.5  EOSABS  --  0.0  --  0.1  BASOSABS  --  0.1  --  0.1    Chemistries  Recent Labs  Lab 04/18/19 2044 04/18/19 2101 04/19/19 0247 04/19/19 1249 04/20/19 0236  NA 125*  --  127*  127*  127* 125* 132*  K 2.2*  --  2.0*  2.0*  2.0* 2.7* 2.6*  CL 74*  --  80*  80*  80* 85* 97*  CO2 29  --  26  26  28 28 27   GLUCOSE 122*  --  106*  109*  108* 104* 112*  BUN 14  --  13  13  13 11 9   CREATININE 0.83  --  0.68  0.63  0.69 0.55* 0.58*  CALCIUM 8.1*  --  8.4*  8.4*  8.2* 7.3* 6.6*  MG  --  1.6* 1.7  --  1.7  AST 86*  --  79*  --  60*  ALT 33  --  34  --  26  ALKPHOS 139*  --  145*  --  97  BILITOT 2.9*  --  2.3*  --  1.3*   ------------------------------------------------------------------------------------------------------------------ No results for input(s): CHOL, HDL, LDLCALC, TRIG, CHOLHDL, LDLDIRECT in the last 72 hours.  No results found for: HGBA1C ------------------------------------------------------------------------------------------------------------------ Recent Labs    04/19/19 0247  TSH 3.511   ------------------------------------------------------------------------------------------------------------------ Recent Labs    04/19/19 0017 04/19/19 0018  VITAMINB12 644  --   FOLATE  --  13.2  FERRITIN 303  --   TIBC 200*  --   IRON 58  --   RETICCTPCT  --  4.4*    Coagulation profile Recent Labs  Lab 04/19/19 0017  INR 1.2    No results for  input(s): DDIMER in the last 72 hours.  Cardiac Enzymes No results for input(s): CKMB, TROPONINI, MYOGLOBIN in the last 168 hours.  Invalid input(s): CK ------------------------------------------------------------------------------------------------------------------    Component Value Date/Time   BNP 912.2 (H) 11/29/2016 0125    Micro Results Recent Results (from the past 240 hour(s))  SARS CORONAVIRUS 2 (TAT 6-24 HRS) Nasopharyngeal Nasopharyngeal Swab     Status: None   Collection Time: 04/18/19  9:12 PM   Specimen: Nasopharyngeal Swab  Result Value Ref Range Status   SARS Coronavirus 2 NEGATIVE NEGATIVE Final    Comment: (NOTE) SARS-CoV-2 target nucleic acids are NOT DETECTED. The SARS-CoV-2 RNA is generally detectable in upper and lower respiratory specimens during the acute phase of infection. Negative results do not preclude SARS-CoV-2 infection, do not rule out co-infections with other pathogens, and should not be used as the sole basis for treatment or other patient management decisions. Negative results must be combined with clinical observations, patient history, and epidemiological information. The expected result is Negative. Fact Sheet for Patients: SugarRoll.be Fact Sheet for Healthcare Providers: https://www.woods-mathews.com/ This test is not yet approved or cleared by the [place] FDA and  has been authorized for detection and/or diagnosis of SARS-CoV-2 by FDA under an Emergency Use Authorization (EUA). This EUA will remain  in effect (meaning this test can be used) for the duration of the COVID-19 declaration under Section 56 4(b)(1) of the Act, 21 U.S.C. section 360bbb-3(b)(1), unless the authorization is terminated or revoked sooner. Performed at [facility], [place] [address], [place], [place] [address]   MRSA PCR Screening     Status: None   Collection Time: 04/19/19  2:02 AM   Specimen: Nasal Mucosa;  Nasopharyngeal  Result Value Ref Range Status   MRSA by PCR NEGATIVE NEGATIVE Final    Comment:        The GeneXpert MRSA Assay (FDA approved for NASAL specimens only), is one component of a comprehensive MRSA colonization surveillance program. It is not intended to diagnose MRSA infection nor to guide or monitor treatment for MRSA infections. Performed at [facility], [place] [address], [place], [place] [address]  Radiology Reports CT Head Wo Contrast  Result Date: 04/18/2019 CLINICAL DATA:  Altered mental status, ETOH withdrawal EXAM: CT HEAD WITHOUT CONTRAST TECHNIQUE: Contiguous axial images were obtained from the base of the skull through the vertex without intravenous contrast. COMPARISON:  11/28/2016 FINDINGS: Brain: No evidence of acute infarction, hemorrhage, hydrocephalus, extra-axial collection or mass lesion/mass effect. Periventricular and deep white matter hypodensity. Vascular: No hyperdense vessel or unexpected calcification. Skull: Normal. Negative for fracture or focal lesion. Sinuses/Orbits: No acute finding. Other: None. IMPRESSION: No acute intracranial pathology.  Small-vessel white matter disease. Electronically Signed   By: Eddie Candle M.D.   On: 04/18/2019 23:29   DG Chest Portable 1 View  Result Date: 04/18/2019 CLINICAL DATA:  Altered mental status EXAM: PORTABLE CHEST 1 VIEW COMPARISON:  Radiograph 12/13/2016 FINDINGS: Stable region of scarring seen in the left lung base with the slight blunting of the costophrenic sulcus possibly related architectural distortion rather than effusion. No convincing features of acute consolidation or edema. No pneumothorax or sizable pleural effusion is evident. Pulmonary vascularity is normally distributed. The aorta is calcified. The remaining cardiomediastinal contours are unremarkable. No acute osseous or soft tissue abnormality. Degenerative changes are present in the imaged spine and shoulders. Telemetry  leads overlie the chest. IMPRESSION: Stable scarring in the left lung base. No acute cardiopulmonary abnormality. Electronically Signed   By: Lovena Le M.D.   On: 04/18/2019 22:45     Time Spent in minutes  30     Desiree Hane M.D on 04/20/2019 at 3:25 PM  To page go to [email or web address] - password [facility]

## 2019-04-21 DIAGNOSIS — E440 Moderate protein-calorie malnutrition: Secondary | ICD-10-CM

## 2019-04-21 LAB — MAGNESIUM
Magnesium: 1.3 mg/dL — ABNORMAL LOW (ref 1.7–2.4)
Magnesium: 1.7 mg/dL (ref 1.7–2.4)

## 2019-04-21 LAB — COMPREHENSIVE METABOLIC PANEL
ALT: 27 U/L (ref 0–44)
AST: 67 U/L — ABNORMAL HIGH (ref 15–41)
Albumin: 2.2 g/dL — ABNORMAL LOW (ref 3.5–5.0)
Alkaline Phosphatase: 95 U/L (ref 38–126)
Anion gap: 9 (ref 5–15)
BUN: 7 mg/dL (ref 6–20)
CO2: 22 mmol/L (ref 22–32)
Calcium: 6.5 mg/dL — ABNORMAL LOW (ref 8.9–10.3)
Chloride: 105 mmol/L (ref 98–111)
Creatinine, Ser: 0.5 mg/dL — ABNORMAL LOW (ref 0.61–1.24)
GFR calc Af Amer: >60 mL/min (ref >60)
GFR calc non Af Amer: >60 mL/min (ref >60)
Glucose, Bld: 89 mg/dL (ref 70–99)
Potassium: 3.8 mmol/L (ref 3.5–5.1)
Sodium: 136 mmol/L (ref 135–145)
Total Bilirubin: 0.9 mg/dL (ref 0.3–1.2)
Total Protein: 4.8 g/dL — ABNORMAL LOW (ref 6.5–8.1)

## 2019-04-21 LAB — CBC
HCT: 25.5 % — ABNORMAL LOW (ref 39.0–52.0)
Hemoglobin: 8.2 g/dL — ABNORMAL LOW (ref 13.0–17.0)
MCH: 37.3 pg — ABNORMAL HIGH (ref 26.0–34.0)
MCHC: 32.2 g/dL (ref 30.0–36.0)
MCV: 115.9 fL — ABNORMAL HIGH (ref 80.0–100.0)
Platelets: 208 10*3/uL (ref 150–400)
RBC: 2.2 MIL/uL — ABNORMAL LOW (ref 4.22–5.81)
RDW: 13.7 % (ref 11.5–15.5)
WBC: 7.9 10*3/uL (ref 4.0–10.5)
nRBC: 0.3 % — ABNORMAL HIGH (ref 0.0–0.2)

## 2019-04-21 LAB — PHOSPHORUS: Phosphorus: 2.2 mg/dL — ABNORMAL LOW (ref 2.5–4.6)

## 2019-04-21 MED ORDER — MAGNESIUM SULFATE 50 % IJ SOLN: 2.0000 g | Freq: Once | INTRAMUSCULAR | Status: DC

## 2019-04-21 MED ORDER — MAGNESIUM SULFATE 2 GM/50ML IV SOLN
2.0000 g | Freq: Once | INTRAVENOUS | Status: AC
  Administered 2019-04-21: 2 g via INTRAVENOUS
  Filled 2019-04-21: qty 50

## 2019-04-21 MED ORDER — POTASSIUM PHOSPHATES 15 MMOLE/5ML IV SOLN: 20.0000 mmol | Freq: Once | INTRAVENOUS | Status: DC

## 2019-04-21 MED ORDER — POTASSIUM CHLORIDE CRYS ER 20 MEQ PO TBCR
40.0000 meq | EXTENDED_RELEASE_TABLET | Freq: Two times a day (BID) | ORAL | Status: AC
  Administered 2019-04-21 (×2): 40 meq via ORAL
  Filled 2019-04-21 (×2): qty 2

## 2019-04-21 MED ORDER — SODIUM PHOSPHATES 45 MMOLE/15ML IV SOLN
20.0000 mmol | Freq: Once | INTRAVENOUS | Status: AC
  Administered 2019-04-21: 20 mmol via INTRAVENOUS
  Filled 2019-04-21: qty 6.67

## 2019-04-21 MED ORDER — LORAZEPAM 2 MG/ML IJ SOLN: 1.0000 mg | INTRAMUSCULAR | Status: DC | PRN

## 2019-04-21 MED ORDER — LORAZEPAM 1 MG PO TABS
1.0000 mg | ORAL_TABLET | ORAL | Status: DC | PRN
  Administered 2019-04-21: 1 mg via ORAL
  Filled 2019-04-21: qty 1

## 2019-04-21 NOTE — Progress Notes (Signed)
[facility]  PROGRESS NOTE  [name] [id number] DOB: Nov 05, 1970 DOA: 04/18/2019 PCP: [name], MD Admit date - 04/18/2019   Admitting Physician Toy Baker, MD  Outpatient Primary MD for the patient is [name], MD  LOS - 2 Brief Narrative   Eugene Jackson is a 49 y.o. year old male with medical history significant for alcohol abuse with history of seizures/DTs in the past who presented on 04/18/2019 with generalized weakness, lightheadedness and falls at home as well as poor appetite in setting of labs alcohol intake 2 days prior to admission and was found to have tachycardia, hypophosphatemia, hypomagnesemia, hypokalemia, hyponatremia, hypochloremia admitted with diagnosis of active alcohol withdrawal in the setting of chronic alcohol abuse severe derangements.  Hospital course complicated by persistent severe hypophosphatemia, hypokalemia poorly responsive to IV repletion.  Subjective  Today has no acute complaints.  No events overnight.  Eating well.  Denies any chest pain or palpitations. A & P   Tachycardic episode, currently rate controlled in normal sinus rhythm, resolved.  Reported SVT overnight on 4/16.  Status post adenosine 6 mg x 1.  At high risk for arrhythmia giving significantly severe electrolyte disturbances. -Continue to monitor on telemetry -Correct electrolyte disturbances -If recurs obtain EKG  Alcohol withdrawal in setting of chronic alcohol abuse ,mild, improving  Reports tremors, none seen on exam, no visual/auditory hallucinations.  Still has multiple electrolyte imbalances but are steadily improving and having only mild signs of withdrawal, deemed safe for less monitoring on telemetry bed.  Reports prior history of DTs/seizures -Transfer out of SDU to telemetry bed -Continue to monitor on CIWA protocol, with as needed Ativan -Folic acid, thiamine  Severe hypokalemia/hypomagnesemia/hypophosphatemia in setting of  chronic alcohol abuse, improving.  Likely deficiencies related to poor nutritional intake related to alcohol abuse.  Potassium improved at 3.8 after aggressive repletion with IV potassium and oral regimen as well as correction of magnesium. Phosphorus still low but improving now at 2.2 from 1.1 24 hours ago -Correct magnesium with IV repletion, continue daily oral magnesium -Correct hypophosphatemia with IV phosphate, continue scheduled phosphate -Closely monitor, at high risk for refeeding syndrome -Repeat BMP in am -DC IV fluids  Nonsevere/moderate malnutrition eating of poor nutritional intake related to alcohol abuse. -Continue Ensure, boost breeze as recommended by nutrition  Anemia of chronic disease.  Labile baseline hemoglobin.  Around 12 on admission, suspect hemoconcentration related to dehydration.  Currently stable at 8.  No signs symptoms of bleeding.  Iron, B12, folate within normal limits. -Monitor CBC.  Left arm swelling.  Secondary to IV extravasation, stable. -Continue to closely monitor, supportive care.  Hyponatremia, resolved  Suspect beer potomania.  Nadir of 125, much better with IVF currently 136.  TSH within normal limits, patient was likely dehydrated on admission. -Continue IV fluids -Continue to encourage oral diet -Repeat BMP in a.m.  Thiamine deficiency, in setting of chronic alcohol abuse -Continue thiamine repletion  Elevated AST.  ALT, bilirubin within normal limits. -Continue to closely monitor  Altered mental status, resolved.  Alert and oriented x4.  CT head non-acute. Ammonia 64 on admission, several electrolyte derangements on admission     Family Communication  : None  Code Status : Full code  Disposition Plan  :  Patient is from home. Anticipated d/c date: 2 to 3 days. Barriers to d/c or necessity for inpatient status:  Needs continue IV repletion for severe electrolyte imbalances (phosphate), alcohol withdrawal symptoms have essentially  resolved, safe to transfer  from stepdown unit to telemetry monitoring.  Consults  : None  Procedures  : None  DVT Prophylaxis  :  Lovenox   Lab Results  Component Value Date   PLT 208 04/21/2019    Diet :  Diet Order            Diet Heart Room service appropriate? Yes; Fluid consistency: Thin  Diet effective now               Inpatient Medications Scheduled Meds: . Chlorhexidine Gluconate Cloth  6 each Topical Daily  . docusate sodium  100 mg Oral BID  . enoxaparin (LOVENOX) injection  40 mg Subcutaneous Daily  . feeding supplement  1 Container Oral BID BM  . feeding supplement (ENSURE ENLIVE)  237 mL Oral Q24H  . folic acid  1 mg Oral Daily  . magnesium oxide  400 mg Oral BID  . multivitamin with minerals  1 tablet Oral Daily  . potassium & sodium phosphates  1 packet Oral TID WC & HS  . potassium chloride  40 mEq Oral BID  . sodium chloride flush  3 mL Intravenous Q12H  . thiamine injection  100 mg Intravenous Daily  . thiamine  100 mg Oral Daily   Or  . thiamine  100 mg Intravenous Daily   Continuous Infusions: . sodium chloride 100 mL/hr at 04/21/19 1200  . potassium PHOSPHATE IVPB (in mmol)     PRN Meds:.LORazepam **OR** LORazepam, ondansetron **OR** ondansetron (ZOFRAN) IV  Antibiotics  :   Anti-infectives (From admission, onward)   None       Objective   Vitals:   04/21/19 1006 04/21/19 1100 04/21/19 1200 04/21/19 1238  BP: 103/75 105/74 112/77 98/61  Pulse:  95 95 92  Resp:  16 16 20   Temp:    97.6 F (36.4 C)  TempSrc:      SpO2:  100% 100% 95%  Weight:      Height:        SpO2: 95 % O2 Flow Rate (L/min): 0 L/min FiO2 (%): 21 %  Wt Readings from Last 3 Encounters:  04/18/19 64 kg  11/29/16 65.1 kg  11/26/12 70.9 kg     Intake/Output Summary (Last 24 hours) at 04/21/2019 1516 Last data filed at 04/21/2019 1200 Gross per 24 hour  Intake 3341.6 ml  Output 1875 ml  Net 1466.6 ml    Physical Exam:    Chronically  ill-appearing male Awake Alert, Oriented X 3, Normal affect No new F.N deficits,  [place].AT, Normal respiratory effort on room air,  CTAB RRR,No Gallops,Rubs or new Murmurs,  +ve B.Sounds, Abd Soft, No tenderness, No rebound, guarding or rigidity. No tremors on exam, no active auditory/visual hallucinations    I have personally reviewed the following:   Data Reviewed:  CBC Recent Labs  Lab 04/18/19 1943 04/19/19 0247 04/20/19 0236 04/20/19 0845 04/21/19 0344  WBC 13.7* 11.2* 7.1 6.8 7.9  HGB 12.2* 11.2* 8.5* 9.1* 8.2*  HCT 34.5* 31.9* 24.9* 27.2* 25.5*  PLT 368 299 215 220 208  MCV 104.2* 104.2* 109.7* 111.9* 115.9*  MCH 36.9* 36.6* 37.4* 37.4* 37.3*  MCHC 35.4 35.1 34.1 33.5 32.2  RDW 12.7 12.6 13.2 13.2 13.7  LYMPHSABS  --  1.3  --  1.3  --   MONOABS  --  0.8  --  0.5  --   EOSABS  --  0.0  --  0.1  --   BASOSABS  --  0.1  --  0.1  --     Chemistries  Recent Labs  Lab 04/18/19 2044 04/18/19 2044 04/18/19 2101 04/19/19 0247 04/19/19 1249 04/20/19 0236 04/20/19 1531 04/21/19 0344  NA 125*   < >  --  127*  127*  127* 125* 132* 136 136  K 2.2*   < >  --  2.0*  2.0*  2.0* 2.7* 2.6* 3.8 3.8  CL 74*   < >  --  80*  80*  80* 85* 97* 102 105  CO2 29   < >  --  26  26  28 28 27 23 22   GLUCOSE 122*   < >  --  106*  109*  108* 104* 112* 92 89  BUN 14   < >  --  13  13  13 11 9 8 7   CREATININE 0.83   < >  --  0.68  0.63  0.69 0.55* 0.58* 0.57* 0.50*  CALCIUM 8.1*   < >  --  8.4*  8.4*  8.2* 7.3* 6.6* 7.0* 6.5*  MG  --   --  1.6* 1.7  --  1.7  --  1.3*  AST 86*  --   --  79*  --  60*  --  67*  ALT 33  --   --  34  --  26  --  27  ALKPHOS 139*  --   --  145*  --  97  --  95  BILITOT 2.9*  --   --  2.3*  --  1.3*  --  0.9   < > = values in this interval not displayed.   ------------------------------------------------------------------------------------------------------------------ No results for input(s): CHOL, HDL, LDLCALC, TRIG, CHOLHDL, LDLDIRECT in  the last 72 hours.  No results found for: HGBA1C ------------------------------------------------------------------------------------------------------------------ Recent Labs    04/19/19 0247  TSH 3.511   ------------------------------------------------------------------------------------------------------------------ Recent Labs    04/19/19 0017 04/19/19 0018  VITAMINB12 644  --   FOLATE  --  13.2  FERRITIN 303  --   TIBC 200*  --   IRON 58  --   RETICCTPCT  --  4.4*    Coagulation profile Recent Labs  Lab 04/19/19 0017  INR 1.2    No results for input(s): DDIMER in the last 72 hours.  Cardiac Enzymes No results for input(s): CKMB, TROPONINI, MYOGLOBIN in the last 168 hours.  Invalid input(s): CK ------------------------------------------------------------------------------------------------------------------    Component Value Date/Time   BNP 912.2 (H) 11/29/2016 0125    Micro Results Recent Results (from the past 240 hour(s))  SARS CORONAVIRUS 2 (TAT 6-24 HRS) Nasopharyngeal Nasopharyngeal Swab     Status: None   Collection Time: 04/18/19  9:12 PM   Specimen: Nasopharyngeal Swab  Result Value Ref Range Status   SARS Coronavirus 2 NEGATIVE NEGATIVE Final    Comment: (NOTE) SARS-CoV-2 target nucleic acids are NOT DETECTED. The SARS-CoV-2 RNA is generally detectable in upper and lower respiratory specimens during the acute phase of infection. Negative results do not preclude SARS-CoV-2 infection, do not rule out co-infections with other pathogens, and should not be used as the sole basis for treatment or other patient management decisions. Negative results must be combined with clinical observations, patient history, and epidemiological information. The expected result is Negative. Fact Sheet for Patients: SugarRoll.be Fact Sheet for Healthcare Providers: https://www.woods-mathews.com/ This test is not yet approved  or cleared by the [place] FDA and  has been authorized for detection and/or diagnosis of SARS-CoV-2 by FDA under an  Emergency Use Authorization (EUA). This EUA will remain  in effect (meaning this test can be used) for the duration of the COVID-19 declaration under Section 56 4(b)(1) of the Act, 21 U.S.C. section 360bbb-3(b)(1), unless the authorization is terminated or revoked sooner. Performed at [facility], [place] [address], [place], [place] [address]   MRSA PCR Screening     Status: None   Collection Time: 04/19/19  2:02 AM   Specimen: Nasal Mucosa; Nasopharyngeal  Result Value Ref Range Status   MRSA by PCR NEGATIVE NEGATIVE Final    Comment:        The GeneXpert MRSA Assay (FDA approved for NASAL specimens only), is one component of a comprehensive MRSA colonization surveillance program. It is not intended to diagnose MRSA infection nor to guide or monitor treatment for MRSA infections. Performed at [facility], [place] [address] [place], [place] [address]     Radiology Reports CT Head Wo Contrast  Result Date: 04/18/2019 CLINICAL DATA:  Altered mental status, ETOH withdrawal EXAM: CT HEAD WITHOUT CONTRAST TECHNIQUE: Contiguous axial images were obtained from the base of the skull through the vertex without intravenous contrast. COMPARISON:  11/28/2016 FINDINGS: Brain: No evidence of acute infarction, hemorrhage, hydrocephalus, extra-axial collection or mass lesion/mass effect. Periventricular and deep white matter hypodensity. Vascular: No hyperdense vessel or unexpected calcification. Skull: Normal. Negative for fracture or focal lesion. Sinuses/Orbits: No acute finding. Other: None. IMPRESSION: No acute intracranial pathology.  Small-vessel white matter disease. Electronically Signed   By: Eddie Candle M.D.   On: 04/18/2019 23:29   DG Chest Portable 1 View  Result Date: 04/18/2019 CLINICAL DATA:  Altered mental status EXAM: PORTABLE CHEST 1  VIEW COMPARISON:  Radiograph 12/13/2016 FINDINGS: Stable region of scarring seen in the left lung base with the slight blunting of the costophrenic sulcus possibly related architectural distortion rather than effusion. No convincing features of acute consolidation or edema. No pneumothorax or sizable pleural effusion is evident. Pulmonary vascularity is normally distributed. The aorta is calcified. The remaining cardiomediastinal contours are unremarkable. No acute osseous or soft tissue abnormality. Degenerative changes are present in the imaged spine and shoulders. Telemetry leads overlie the chest. IMPRESSION: Stable scarring in the left lung base. No acute cardiopulmonary abnormality. Electronically Signed   By: Lovena Le M.D.   On: 04/18/2019 22:45     Time Spent in minutes  30     Desiree Hane M.D on 04/21/2019 at 3:16 PM  To page go to [email or web address] - password [email or web address]

## 2019-04-22 LAB — CBC
HCT: 28.4 % — ABNORMAL LOW (ref 39.0–52.0)
Hemoglobin: 9.1 g/dL — ABNORMAL LOW (ref 13.0–17.0)
MCH: 37.8 pg — ABNORMAL HIGH (ref 26.0–34.0)
MCHC: 32 g/dL (ref 30.0–36.0)
MCV: 117.8 fL — ABNORMAL HIGH (ref 80.0–100.0)
Platelets: 213 10*3/uL (ref 150–400)
RBC: 2.41 MIL/uL — ABNORMAL LOW (ref 4.22–5.81)
RDW: 14.5 % (ref 11.5–15.5)
WBC: 9.3 10*3/uL (ref 4.0–10.5)
nRBC: 0 % (ref 0.0–0.2)

## 2019-04-22 LAB — COMPREHENSIVE METABOLIC PANEL
ALT: 28 U/L (ref 0–44)
AST: 67 U/L — ABNORMAL HIGH (ref 15–41)
Albumin: 2.4 g/dL — ABNORMAL LOW (ref 3.5–5.0)
Alkaline Phosphatase: 103 U/L (ref 38–126)
Anion gap: 10 (ref 5–15)
BUN: 5 mg/dL — ABNORMAL LOW (ref 6–20)
CO2: 21 mmol/L — ABNORMAL LOW (ref 22–32)
Calcium: 7 mg/dL — ABNORMAL LOW (ref 8.9–10.3)
Chloride: 106 mmol/L (ref 98–111)
Creatinine, Ser: 0.56 mg/dL — ABNORMAL LOW (ref 0.61–1.24)
GFR calc Af Amer: >60 mL/min (ref >60)
GFR calc non Af Amer: >60 mL/min (ref >60)
Glucose, Bld: 89 mg/dL (ref 70–99)
Potassium: 4.6 mmol/L (ref 3.5–5.1)
Sodium: 137 mmol/L (ref 135–145)
Total Bilirubin: 0.7 mg/dL (ref 0.3–1.2)
Total Protein: 5.4 g/dL — ABNORMAL LOW (ref 6.5–8.1)

## 2019-04-22 LAB — MAGNESIUM: Magnesium: 1.4 mg/dL — ABNORMAL LOW (ref 1.7–2.4)

## 2019-04-22 LAB — PHOSPHORUS: Phosphorus: 3.6 mg/dL (ref 2.5–4.6)

## 2019-04-22 MED ORDER — MAGNESIUM SULFATE 2 GM/50ML IV SOLN
2.0000 g | Freq: Once | INTRAVENOUS | Status: AC
  Administered 2019-04-22: 10:00:00 2 g via INTRAVENOUS
  Filled 2019-04-22: qty 50

## 2019-04-22 NOTE — Progress Notes (Signed)
[facility]  PROGRESS NOTE  Eugene Jackson [id number] DOB: Dec 22, 1970 DOA: 04/18/2019 PCP: [name], MD Admit date - 04/18/2019   Admitting Physician Toy Baker, MD  Outpatient Primary MD for the patient is [name], MD  LOS - 3 Brief Narrative   Eugene Jackson is a 49 y.o. year old male with medical history significant for alcohol abuse with history of seizures/DTs in the past who presented on 04/18/2019 with generalized weakness, lightheadedness and falls at home as well as poor appetite in setting of labs alcohol intake 2 days prior to admission and was found to have tachycardia, hypophosphatemia, hypomagnesemia, hypokalemia, hyponatremia, hypochloremia admitted with diagnosis of active alcohol withdrawal in the setting of chronic alcohol abuse severe derangements.  Hospital course complicated by persistent severe hypophosphatemia, hypokalemia poorly responsive to IV repletion.  Subjective  Today has no acute complaints.  No events overnight.  Eating well.  Denies any chest pain or palpitations. A & P   Tachycardic episode, currently rate controlled in normal sinus rhythm, resolved.  Reported SVT overnight on 4/16.  Status post adenosine 6 mg x 1.  At high risk for arrhythmia giving significantly severe electrolyte disturbances. -Continue to monitor on telemetry -Correct electrolyte disturbances -If recurs obtain EKG  Alcohol withdrawal in setting of chronic alcohol abuse ,mild, stable no tremors, no hallucinations, no tachycardia, electrolyte abnormalities significantly improved - -Continue to monitor on CIWA protocol, with as needed Ativan -Folic acid, thiamine  Severe hypokalemia/hypomagnesemia/hypophosphatemia in setting of chronic alcohol abuse, improving.  Likely deficiencies related to poor nutritional intake related to alcohol abuse.  Only abnormality remaining is hypomagnesemia -IV mag repletion, repeat in a.m.continue daily oral  magnesium -Correct hypophosphatemia with IV phosphate, continue scheduled phosphate -Closely monitor, at high risk for refeeding syndrome -Repeat BMP in am -DC IV fluids  Nonsevere/moderate malnutrition eating of poor nutritional intake related to alcohol abuse. -Continue Ensure, boost breeze as recommended by nutrition  Anemia of chronic disease.  Labile baseline hemoglobin.  Around 12 on admission, suspect hemoconcentration related to dehydration.  Currently stable at 8.  No signs symptoms of bleeding.  Iron, B12, folate within normal limits. -Monitor CBC.  Left arm swelling.  Secondary to IV extravasation, stable. -Continue to closely monitor, supportive care.  Hyponatremia, resolved  Suspect beer potomania.  Nadir of 125, much better with IVF currently 136.  TSH within normal limits, patient was likely dehydrated on admission. -Continue IV fluids -Continue to encourage oral diet -Repeat BMP in a.m.  Thiamine deficiency, in setting of chronic alcohol abuse -Continue thiamine repletion  Elevated AST.  ALT, bilirubin within normal limits. -Continue to closely monitor  Altered mental status, resolved.  Alert and oriented x4.  CT head non-acute. Ammonia 64 on admission, several electrolyte derangements on admission     Family Communication  : None  Code Status : Full code  Disposition Plan  :  Patient is from home. Anticipated d/c date:  24-48 hours barriers to d/c or necessity for inpatient status:  check mg/phos/k off IV repletion, likely dc if stable in am.  Consults  : None  Procedures  : None  DVT Prophylaxis  :  Lovenox   Lab Results  Component Value Date   PLT 213 04/22/2019    Diet :  Diet Order            Diet Heart Room service appropriate? Yes; Fluid consistency: Thin  Diet effective now  Inpatient Medications Scheduled Meds: . Chlorhexidine Gluconate Cloth  6 each Topical Daily  . docusate sodium  100 mg Oral BID  . enoxaparin  (LOVENOX) injection  40 mg Subcutaneous Daily  . feeding supplement  1 Container Oral BID BM  . feeding supplement (ENSURE ENLIVE)  237 mL Oral Q24H  . folic acid  1 mg Oral Daily  . magnesium oxide  400 mg Oral BID  . multivitamin with minerals  1 tablet Oral Daily  . potassium & sodium phosphates  1 packet Oral TID WC & HS  . sodium chloride flush  3 mL Intravenous Q12H  . thiamine injection  100 mg Intravenous Daily  . thiamine  100 mg Oral Daily   Or  . thiamine  100 mg Intravenous Daily   Continuous Infusions:  PRN Meds:.LORazepam **OR** LORazepam, ondansetron **OR** ondansetron (ZOFRAN) IV  Antibiotics  :   Anti-infectives (From admission, onward)   None       Objective   Vitals:   04/22/19 1335 04/22/19 1943 04/22/19 1945 04/22/19 1946  BP: 91/65 (!) 123/107 (!) 87/59 97/65  Pulse: 92 98 88 91  Resp: 18 18    Temp: 97.7 F (36.5 C) 97.8 F (36.6 C)    TempSrc: Oral Oral    SpO2: 100% 100% (!) 75% (!) 75%  Weight:      Height:        SpO2: (!) 75 % O2 Flow Rate (L/min): 0 L/min FiO2 (%): 21 %  Wt Readings from Last 3 Encounters:  04/18/19 64 kg  11/29/16 65.1 kg  11/26/12 70.9 kg     Intake/Output Summary (Last 24 hours) at 04/22/2019 2053 Last data filed at 04/22/2019 1228 Gross per 24 hour  Intake 364 ml  Output 825 ml  Net -461 ml    Physical Exam:    Chronically ill-appearing male Awake Alert, Oriented X 3, Normal affect No new F.N deficits,  [place].AT, Normal respiratory effort on room air,  CTAB RRR,No Gallops,Rubs or new Murmurs,  +ve B.Sounds, Abd Soft, No tenderness, No rebound, guarding or rigidity. No tremors on exam, no active auditory/visual hallucinations    I have personally reviewed the following:   Data Reviewed:  CBC Recent Labs  Lab 04/19/19 0247 04/20/19 0236 04/20/19 0845 04/21/19 0344 04/22/19 0548  WBC 11.2* 7.1 6.8 7.9 9.3  HGB 11.2* 8.5* 9.1* 8.2* 9.1*  HCT 31.9* 24.9* 27.2* 25.5* 28.4*  PLT 299 215 220  208 213  MCV 104.2* 109.7* 111.9* 115.9* 117.8*  MCH 36.6* 37.4* 37.4* 37.3* 37.8*  MCHC 35.1 34.1 33.5 32.2 32.0  RDW 12.6 13.2 13.2 13.7 14.5  LYMPHSABS 1.3  --  1.3  --   --   MONOABS 0.8  --  0.5  --   --   EOSABS 0.0  --  0.1  --   --   BASOSABS 0.1  --  0.1  --   --     Chemistries  Recent Labs  Lab 04/18/19 2044 04/18/19 2101 04/19/19 0247 04/19/19 0247 04/19/19 1249 04/20/19 0236 04/20/19 1531 04/21/19 0344 04/21/19 1644 04/22/19 0548  NA 125*   < > 127*  127*  127*   < > 125* 132* 136 136  --  137  K 2.2*   < > 2.0*  2.0*  2.0*   < > 2.7* 2.6* 3.8 3.8  --  4.6  CL 74*   < > 80*  80*  80*   < > 85* 97* 102  105  --  106  CO2 29   < > 26  26  28    < > 28 27 23 22   --  21*  GLUCOSE 122*   < > 106*  109*  108*   < > 104* 112* 92 89  --  89  BUN 14   < > 13  13  13    < > 11 9 8 7   --  5*  CREATININE 0.83   < > 0.68  0.63  0.69   < > 0.55* 0.58* 0.57* 0.50*  --  0.56*  CALCIUM 8.1*   < > 8.4*  8.4*  8.2*   < > 7.3* 6.6* 7.0* 6.5*  --  7.0*  MG  --    < > 1.7  --   --  1.7  --  1.3* 1.7 1.4*  AST 86*  --  79*  --   --  60*  --  67*  --  67*  ALT 33  --  34  --   --  26  --  27  --  28  ALKPHOS 139*  --  145*  --   --  97  --  95  --  103  BILITOT 2.9*  --  2.3*  --   --  1.3*  --  0.9  --  0.7   < > = values in this interval not displayed.   ------------------------------------------------------------------------------------------------------------------ No results for input(s): CHOL, HDL, LDLCALC, TRIG, CHOLHDL, LDLDIRECT in the last 72 hours.  No results found for: HGBA1C ------------------------------------------------------------------------------------------------------------------ No results for input(s): TSH, T4TOTAL, T3FREE, THYROIDAB in the last 72 hours.  Invalid input(s): FREET3 ------------------------------------------------------------------------------------------------------------------ No results for input(s): VITAMINB12, FOLATE,  FERRITIN, TIBC, IRON, RETICCTPCT in the last 72 hours.  Coagulation profile Recent Labs  Lab 04/19/19 0017  INR 1.2    No results for input(s): DDIMER in the last 72 hours.  Cardiac Enzymes No results for input(s): CKMB, TROPONINI, MYOGLOBIN in the last 168 hours.  Invalid input(s): CK ------------------------------------------------------------------------------------------------------------------    Component Value Date/Time   BNP 912.2 (H) 11/29/2016 0125    Micro Results Recent Results (from the past 240 hour(s))  SARS CORONAVIRUS 2 (TAT 6-24 HRS) Nasopharyngeal Nasopharyngeal Swab     Status: None   Collection Time: 04/18/19  9:12 PM   Specimen: Nasopharyngeal Swab  Result Value Ref Range Status   SARS Coronavirus 2 NEGATIVE NEGATIVE Final    Comment: (NOTE) SARS-CoV-2 target nucleic acids are NOT DETECTED. The SARS-CoV-2 RNA is generally detectable in upper and lower respiratory specimens during the acute phase of infection. Negative results do not preclude SARS-CoV-2 infection, do not rule out co-infections with other pathogens, and should not be used as the sole basis for treatment or other patient management decisions. Negative results must be combined with clinical observations, patient history, and epidemiological information. The expected result is Negative. Fact Sheet for Patients: SugarRoll.be Fact Sheet for Healthcare Providers: https://www.woods-mathews.com/ This test is not yet approved or cleared by the [place] FDA and  has been authorized for detection and/or diagnosis of SARS-CoV-2 by FDA under an Emergency Use Authorization (EUA). This EUA will remain  in effect (meaning this test can be used) for the duration of the COVID-19 declaration under Section 56 4(b)(1) of the Act, 21 U.S.C. section 360bbb-3(b)(1), unless the authorization is terminated or revoked sooner. Performed at [facility],  [place] [address], [place], [place] [address]   MRSA PCR Screening  Status: None   Collection Time: 04/19/19  2:02 AM   Specimen: Nasal Mucosa; Nasopharyngeal  Result Value Ref Range Status   MRSA by PCR NEGATIVE NEGATIVE Final    Comment:        The GeneXpert MRSA Assay (FDA approved for NASAL specimens only), is one component of a comprehensive MRSA colonization surveillance program. It is not intended to diagnose MRSA infection nor to guide or monitor treatment for MRSA infections. Performed at [facility], [place] [address] [place], [place] [address]     Radiology Reports CT Head Wo Contrast  Result Date: 04/18/2019 CLINICAL DATA:  Altered mental status, ETOH withdrawal EXAM: CT HEAD WITHOUT CONTRAST TECHNIQUE: Contiguous axial images were obtained from the base of the skull through the vertex without intravenous contrast. COMPARISON:  11/28/2016 FINDINGS: Brain: No evidence of acute infarction, hemorrhage, hydrocephalus, extra-axial collection or mass lesion/mass effect. Periventricular and deep white matter hypodensity. Vascular: No hyperdense vessel or unexpected calcification. Skull: Normal. Negative for fracture or focal lesion. Sinuses/Orbits: No acute finding. Other: None. IMPRESSION: No acute intracranial pathology.  Small-vessel white matter disease. Electronically Signed   By: Eddie Candle M.D.   On: 04/18/2019 23:29   DG Chest Portable 1 View  Result Date: 04/18/2019 CLINICAL DATA:  Altered mental status EXAM: PORTABLE CHEST 1 VIEW COMPARISON:  Radiograph 12/13/2016 FINDINGS: Stable region of scarring seen in the left lung base with the slight blunting of the costophrenic sulcus possibly related architectural distortion rather than effusion. No convincing features of acute consolidation or edema. No pneumothorax or sizable pleural effusion is evident. Pulmonary vascularity is normally distributed. The aorta is calcified. The remaining cardiomediastinal  contours are unremarkable. No acute osseous or soft tissue abnormality. Degenerative changes are present in the imaged spine and shoulders. Telemetry leads overlie the chest. IMPRESSION: Stable scarring in the left lung base. No acute cardiopulmonary abnormality. Electronically Signed   By: Lovena Le M.D.   On: 04/18/2019 22:45     Time Spent in minutes  30     Desiree Hane M.D on 04/22/2019 at 8:53 PM  To page go to [email or web address] - password [email or web address]

## 2019-04-22 NOTE — Evaluation (Signed)
Physical Therapy Evaluation Patient Details Name: Eugene Jackson MRN: [id number] DOB: 1970-10-23 Today's Date: 04/22/2019   History of Present Illness  "49 y.o. year old male with medical history significant for alcohol abuse with history of seizures/DTs in the past who presented on 04/18/2019 with generalized weakness, lightheadedness and falls at home as well as poor appetite in setting of labs alcohol intake 2 days prior to admission and was found to have tachycardia, hypophosphatemia, hypomagnesemia, hypokalemia, hyponatremia, hypochloremia admitted with diagnosis of active alcohol withdrawal in the setting of chronic alcohol abuse severe derangements."  Clinical Impression  Pt admitted with above diagnosis.  Pt currently with functional limitations due to the deficits listed below (see PT Problem List). Pt will benefit from skilled PT to increase their independence and safety with mobility to allow discharge to the venue listed below.  Pt reports multiple falls prior to admission (stating, "I know I drink alcohol, but I fall even when I'm sober.")  Pt able to ambulate in hallway with RW however distance limited due to fatigue.  Pt would benefit from HHPT and RW upon d/c.     Follow Up Recommendations Home health PT;Supervision for mobility/OOB    Equipment Recommendations  Rolling walker with 5" wheels(may need RW, pt uncertain if he has one already)    Recommendations for Other Services       Precautions / Restrictions Precautions Precautions: Fall      Mobility  Bed Mobility Overal bed mobility: Needs Assistance Bed Mobility: Supine to Sit     Supine to sit: Supervision;HOB elevated        Transfers Overall transfer level: Needs assistance Equipment used: Rolling walker (2 wheeled) Transfers: Sit to/from Stand Sit to Stand: Min guard         General transfer comment: min/guard for safety, pt utilized wide BOS, cues for hand  placement  Ambulation/Gait Ambulation/Gait assistance: Min guard Gait Distance (Feet): 80 Feet Assistive device: Rolling walker (2 wheeled) Gait Pattern/deviations: Step-through pattern;Decreased stride length;Trunk flexed     General Gait Details: cues for posture and RW positioning (tends to keep RW too far forward), decreased bil knee flexion observed  Stairs            Wheelchair Mobility    Modified Rankin (Stroke Patients Only)       Balance Overall balance assessment: History of Falls                                           Pertinent Vitals/Pain Pain Assessment: No/denies pain    Home Living Family/patient expects to be discharged to:: Private residence Living Arrangements: Alone Available Help at Discharge: Available PRN/intermittently Type of Home: Mobile home Home Access: Stairs to enter Entrance Stairs-Rails: Psychiatric nurse of Steps: 5 Home Layout: One level Home Equipment: None Additional Comments: may have RW?  pt checking with his ex-spouse    Prior Function Level of Independence: Independent               Hand Dominance        Extremity/Trunk Assessment        Lower Extremity Assessment Lower Extremity Assessment: Generalized weakness       Communication      Cognition Arousal/Alertness: Awake/alert Behavior During Therapy: WFL for tasks assessed/performed Overall Cognitive Status: Within Functional Limits for tasks assessed  General Comments      Exercises     Assessment/Plan    PT Assessment Patient needs continued PT services  PT Problem List Decreased strength;Decreased mobility;Decreased range of motion;Decreased balance;Decreased knowledge of use of DME;Decreased activity tolerance;Decreased coordination       PT Treatment Interventions DME instruction;Therapeutic activities;Cognitive remediation;Gait  training;Therapeutic exercise;Patient/family education;Functional mobility training;Neuromuscular re-education;Stair training;Balance training    PT Goals (Current goals can be found in the Care Plan section)  Acute Rehab PT Goals PT Goal Formulation: With patient Time For Goal Achievement: 05/06/19 Potential to Achieve Goals: Good    Frequency Min 3X/week   Barriers to discharge        Co-evaluation               AM-PAC PT "6 Clicks" Mobility  Outcome Measure Help needed turning from your back to your side while in a flat bed without using bedrails?: A Little Help needed moving from lying on your back to sitting on the side of a flat bed without using bedrails?: A Little Help needed moving to and from a bed to a chair (including a wheelchair)?: A Little Help needed standing up from a chair using your arms (e.g., wheelchair or bedside chair)?: A Little Help needed to walk in hospital room?: A Little Help needed climbing 3-5 steps with a railing? : A Little 6 Click Score: 18    End of Session Equipment Utilized During Treatment: Gait belt Activity Tolerance: Patient tolerated treatment well Patient left: in chair;with call bell/phone within reach;with chair alarm set Nurse Communication: Mobility status PT Visit Diagnosis: Other abnormalities of gait and mobility (R26.89);Unsteadiness on feet (R26.81)    Time: [id number] PT Time Calculation (min) (ACUTE ONLY): 17 min   Charges:   PT Evaluation $PT Eval Low Complexity: 1 Low        Kati PT, DPT Acute Rehabilitation Services Office: [phone]  Trena Platt 04/22/2019, 12:41 PM

## 2019-04-23 DIAGNOSIS — E538 Deficiency of other specified B group vitamins: Secondary | ICD-10-CM

## 2019-04-23 LAB — COMPREHENSIVE METABOLIC PANEL
ALT: 24 U/L (ref 0–44)
AST: 54 U/L — ABNORMAL HIGH (ref 15–41)
Albumin: 2.3 g/dL — ABNORMAL LOW (ref 3.5–5.0)
Alkaline Phosphatase: 87 U/L (ref 38–126)
Anion gap: 7 (ref 5–15)
BUN: 7 mg/dL (ref 6–20)
CO2: 20 mmol/L — ABNORMAL LOW (ref 22–32)
Calcium: 7.4 mg/dL — ABNORMAL LOW (ref 8.9–10.3)
Chloride: 106 mmol/L (ref 98–111)
Creatinine, Ser: 0.63 mg/dL (ref 0.61–1.24)
GFR calc Af Amer: >60 mL/min (ref >60)
GFR calc non Af Amer: >60 mL/min (ref >60)
Glucose, Bld: 93 mg/dL (ref 70–99)
Potassium: 4 mmol/L (ref 3.5–5.1)
Sodium: 133 mmol/L — ABNORMAL LOW (ref 135–145)
Total Bilirubin: 0.8 mg/dL (ref 0.3–1.2)
Total Protein: 5.1 g/dL — ABNORMAL LOW (ref 6.5–8.1)

## 2019-04-23 LAB — MAGNESIUM: Magnesium: 1.4 mg/dL — ABNORMAL LOW (ref 1.7–2.4)

## 2019-04-23 LAB — PHOSPHORUS: Phosphorus: 4.3 mg/dL (ref 2.5–4.6)

## 2019-04-23 MED ORDER — MAGNESIUM OXIDE 400 (241.3 MG) MG PO TABS: 400.0000 mg | ORAL_TABLET | Freq: Two times a day (BID) | ORAL | 0 refills | Status: DC

## 2019-04-23 MED ORDER — THIAMINE HCL 100 MG PO TABS: 100.0000 mg | ORAL_TABLET | Freq: Every day | ORAL | 1 refills | Status: AC

## 2019-04-23 MED ORDER — ADULT MULTIVITAMIN W/MINERALS CH: 1.0000 | ORAL_TABLET | Freq: Every day | ORAL | 1 refills | Status: DC

## 2019-04-23 MED ORDER — MAGNESIUM SULFATE 2 GM/50ML IV SOLN
2.0000 g | Freq: Once | INTRAVENOUS | Status: AC
  Administered 2019-04-23: 2 g via INTRAVENOUS
  Filled 2019-04-23: qty 50

## 2019-04-23 MED ORDER — FOLIC ACID 1 MG PO TABS: 1.0000 mg | ORAL_TABLET | Freq: Every day | ORAL | 1 refills | Status: AC

## 2019-04-23 NOTE — Discharge Summary (Signed)
Eugene Jackson [id number] DOB: 1970-07-13 DOA: 04/18/2019  PCP: [name], MD  Admit date: 04/18/2019 Discharge date: 04/23/2019  Admitted From: Home Disposition: Home  Recommendations for Outpatient Follow-up:  1. Follow up with PCP in 1-2 weeks 2. Please obtain CBC (hemoglobin), CMP (sodium, LFTs) on PCP follow-up   Home Health: Outpatient PT at [facility] Equipment/Devices: None  Discharge Condition: Stable CODE STATUS: Full code   Brief/Interim Summary: History of present illness:  YOGI COLLA is a 49 y.o. year old male with medical history significant for alcohol abuse with history of seizures/DTs in the past who presented on 04/18/2019 with generalized weakness, lightheadedness and falls at home as well as poor appetite in setting of last alcohol intake 2 days prior to admission and was found to have tachycardia, hypophosphatemia, hypomagnesemia, hypokalemia, hyponatremia, hypochloremia admitted with diagnosis of active alcohol withdrawal in the setting of chronic alcohol abuse with severe electrolyte derangements.  Admitted with severe hypokalemia, hypomagnesemia, hypophosphatemia in the setting of chronic alcohol abuse that required IV repletion respectively over the course of 48 to 72 hours.  Patient is also monitor closely in stepdown unit on CIWA protocol while going through active alcohol withdrawal.  Patient did have episode of SVT overnight on 4/16 that required 1 dose of adenosine 6 mg in the setting of hypokalemia, hypomagnesemia and hypophosphatemia.  With correction of electrolytes patient did not have any recurrent tachycardic episodes.  Resources for alcohol cessation were provided by case management, patient was counseled on the importance of cessation of alcohol use, admits that he uses it for coping mechanisms related to emotional stress of father and brother passing away about a year ago.  She also had hyponatremia with nadir of 125 loculated to be  reported mania that improved with adequate nutrition here and on discharge 136.  Multivitamins, thiamine, folate, oral magnesium provided on discharge.   Remaining hospital course addressed in problem based format below:   Hospital Course:   Tachycardic episode, currently rate controlled in normal sinus rhythm, resolved.  Reported SVT overnight on 4/16 in setting of severe hypokalemia, hypophosphatemia, hypomagnesemia.  Status post adenosine 6 mg x 1 on 4/16.    No recurrent arrhythmias on telemetry with correction of electrolytes   Alcohol withdrawal in setting of chronic alcohol abuse , resolved.  No tremors, no hallucinations, no tachycardia, electrolyte abnormalities resolved.  No longer requiring as needed Ativan. -Encourage alcohol cessation, outpatient which was provided by case management -Folic acid, thiamine, multivitamin  Severe hypokalemia/hypomagnesemia/hypophosphatemia in setting of chronic alcohol abuse, improving.  Likely deficiencies related to poor nutritional intake related to alcohol abuse.  No longer requiring IV repletion over the past 24 hours.  Phosphorus 4.3, potassium 4.6 on discharge.  Still has a bit of hypomagnesemia of 1.4, give additional dose of IV magnesium and will continue oral magnesium on discharge -Continue oral Mag-Ox 40 mg twice daily -Recommend mag recheck on PCP follow-up  Nonsevere/moderate malnutrition eating of poor nutritional intake related to alcohol abuse. -Continue Ensure, boost breeze as recommended by nutrition -Encourage alcohol cessation to allow for improved nutrition  Anemia of chronic disease.  Labile baseline hemoglobin.  Around 12 on admission, suspect hemoconcentration related to dehydration.    Remained stable around 9.  No signs symptoms of bleeding.  Iron, B12, folate within normal limits. -Can monitor CBC on PCP follow-up  Left arm swelling.  Secondary to IV extravasation, stable. -Continue to closely monitor, supportive  care.  Hyponatremia, resolved  Suspect beer potomania in addition  to some element of dehydration on presentation.  Nadir of 125, resolved with IV fluids and improvement in nutrition.  TSH within normal limits.  133 on discharge -BMP on PCP follow-up  Thiamine deficiency, in setting of chronic alcohol abuse -Continue oral thiamine -vitamin B1 still pending  Elevated AST.  ALT, bilirubin within normal limits.  Hepatitis panel unremarkable.  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  Alert and oriented x4 on discharge. CT head non-acute. Ammonia 64 on admission, several electrolyte derangements on admission.    Consultations:  None  Procedures/Studies: None Subjective: Feels well.  No abdominal pain, no nausea vomiting.  Eating well.  Ready for home. Discharge Exam: Vitals:   04/23/19 0415 04/23/19 0514  BP:  99/68  Pulse:  91  Resp:  20  Temp:  97.9 F (36.6 C)  SpO2: 100% 99%   Vitals:   04/23/19 0300 04/23/19 0400 04/23/19 0415 04/23/19 0514  BP:    99/68  Pulse:    91  Resp: 18 (!) 22  20  Temp:    97.9 F (36.6 C)  TempSrc:    Oral  SpO2:   100% 99%  Weight:      Height:        General: Male looks older than stated age, lying in bed, no apparent distress Eyes: EOMI, anicteric ENT: Oral Mucosa clear and moist Cardiovascular: regular rate and rhythm, no murmurs, rubs or gallops, no edema, Respiratory: Normal respiratory effort on room air, lungs clear to auscultation bilaterally Abdomen: soft, non-distended, non-tender, normal bowel sounds Skin: No Rash Neurologic: Grossly no focal neuro deficit.Mental status AAOx3, speech normal, Psychiatric:Appropriate affect, and mood  Discharge Diagnoses:  Active Problems:   Alcohol abuse   Hyponatremia   Hypokalemia   Anemia of chronic disease   Abnormal liver function tests   Moderate protein-calorie malnutrition (HCC)   Hypomagnesemia   B12 deficiency    Macrocytic anemia   Alcohol withdrawal syndrome (HCC)   Hypophosphatemia   Left arm swelling    Discharge Instructions  Discharge Instructions    Diet - low sodium heart healthy   Complete by: As directed    Increase activity slowly   Complete by: As directed      Allergies as of 04/23/2019   No Known Allergies     Medication List    STOP taking these medications   cyanocobalamin 1000 MCG tablet   hydrOXYzine 10 MG tablet Commonly known as: ATARAX/VISTARIL   nicotine 21 mg/24hr patch Commonly known as: NICODERM CQ - dosed in mg/24 hours     TAKE these medications   folic acid 1 MG tablet Commonly known as: FOLVITE Take 1 tablet (1 mg total) by mouth daily. Start taking on: April 24, 2019   magnesium oxide 400 (241.3 Mg) MG tablet Commonly known as: MAG-OX Take 1 tablet (400 mg total) by mouth 2 (two) times daily.   multivitamin with minerals Tabs tablet Take 1 tablet by mouth daily. Start taking on: April 24, 2019   thiamine 100 MG tablet Take 1 tablet (100 mg total) by mouth daily. Start taking on: April 24, 2019      Follow-up Information    [facility] Follow up.   Why: Call if you need help Contact information: [place] [address] [phone]        [facility] Follow up.   Why: Call if you need help Contact information: [address]  [place]       [place], Marshall Cork., MD .   Specialty: Family Medicine Contact information: [address] [address] [phone]          No Known Allergies      The results of significant diagnostics from this hospitalization (including imaging, microbiology, ancillary and laboratory) are listed below for reference.     Microbiology: Recent Results (from the past 240 hour(s))  SARS CORONAVIRUS 2 (TAT 6-24 HRS) Nasopharyngeal Nasopharyngeal Swab     Status: None   Collection Time: 04/18/19  9:12 PM   Specimen:  Nasopharyngeal Swab  Result Value Ref Range Status   SARS Coronavirus 2 NEGATIVE NEGATIVE Final    Comment: (NOTE) SARS-CoV-2 target nucleic acids are NOT DETECTED. The SARS-CoV-2 RNA is generally detectable in upper and lower respiratory specimens during the acute phase of infection. Negative results do not preclude SARS-CoV-2 infection, do not rule out co-infections with other pathogens, and should not be used as the sole basis for treatment or other patient management decisions. Negative results must be combined with clinical observations, patient history, and epidemiological information. The expected result is Negative. Fact Sheet for Patients: SugarRoll.be Fact Sheet for Healthcare Providers: https://www.woods-mathews.com/ This test is not yet approved or cleared by the [place] FDA and  has been authorized for detection and/or diagnosis of SARS-CoV-2 by FDA under an Emergency Use Authorization (EUA). This EUA will remain  in effect (meaning this test can be used) for the duration of the COVID-19 declaration under Section 56 4(b)(1) of the Act, 21 U.S.C. section 360bbb-3(b)(1), unless the authorization is terminated or revoked sooner. Performed at [facility], [place] [address], [place], [place] [address]   MRSA PCR Screening     Status: None   Collection Time: 04/19/19  2:02 AM   Specimen: Nasal Mucosa; Nasopharyngeal  Result Value Ref Range Status   MRSA by PCR NEGATIVE NEGATIVE Final    Comment:        The GeneXpert MRSA Assay (FDA approved for NASAL specimens only), is one component of a comprehensive MRSA colonization surveillance program. It is not intended to diagnose MRSA infection nor to guide or monitor treatment for MRSA infections. Performed at [facility], [place] [address], [place], [place] [address]      Labs: BNP (last 3 results) No results for input(s): BNP in the last 8760  hours. Basic Metabolic Panel: Recent Labs  Lab 04/20/19 0236 04/20/19 1531 04/20/19 1800 04/21/19 0344 04/21/19 1644 04/22/19 0548 04/23/19 0623  NA 132* 136  --  136  --  137 133*  K 2.6* 3.8  --  3.8  --  4.6 4.0  CL 97* 102  --  105  --  106 106  CO2 27 23  --  22  --  21* 20*  GLUCOSE 112* 92  --  89  --  89 93  BUN 9 8  --  7  --  5* 7  CREATININE 0.58* 0.57*  --  0.50*  --  0.56* 0.63  CALCIUM 6.6* 7.0*  --  6.5*  --  7.0* 7.4*  MG 1.7  --   --  1.3* 1.7 1.4* 1.4*  PHOS 1.2*  --  <1.0* 2.2*  --  3.6 4.3   Liver Function Tests: Recent Labs  Lab 04/19/19 0247 04/20/19 0236 04/21/19 0344 04/22/19 0548 04/23/19 0623  AST 79* 60* 67* 67* 54*  ALT 34 26 27 28  24  ALKPHOS 145* 97 95 103 87  BILITOT 2.3* 1.3* 0.9 0.7 0.8  PROT 7.4 4.9* 4.8* 5.4* 5.1*  ALBUMIN 3.6 2.3* 2.2* 2.4* 2.3*   No results for input(s): LIPASE, AMYLASE in the last 168 hours. Recent Labs  Lab 04/18/19 2120  AMMONIA 64*   CBC: Recent Labs  Lab 04/19/19 0247 04/20/19 0236 04/20/19 0845 04/21/19 0344 04/22/19 0548  WBC 11.2* 7.1 6.8 7.9 9.3  NEUTROABS 8.9*  --  4.8  --   --   HGB 11.2* 8.5* 9.1* 8.2* 9.1*  HCT 31.9* 24.9* 27.2* 25.5* 28.4*  MCV 104.2* 109.7* 111.9* 115.9* 117.8*  PLT 299 215 220 208 213   Cardiac Enzymes: No results for input(s): CKTOTAL, CKMB, CKMBINDEX, TROPONINI in the last 168 hours. BNP: Invalid input(s): POCBNP CBG: No results for input(s): GLUCAP in the last 168 hours. D-Dimer No results for input(s): DDIMER in the last 72 hours. Hgb A1c No results for input(s): HGBA1C in the last 72 hours. Lipid Profile No results for input(s): CHOL, HDL, LDLCALC, TRIG, CHOLHDL, LDLDIRECT in the last 72 hours. Thyroid function studies No results for input(s): TSH, T4TOTAL, T3FREE, THYROIDAB in the last 72 hours.  Invalid input(s): FREET3 Anemia work up No results for input(s): VITAMINB12, FOLATE, FERRITIN, TIBC, IRON, RETICCTPCT in the last 72 hours. Urinalysis     Component Value Date/Time   COLORURINE AMBER (A) 04/19/2019 0021   APPEARANCEUR CLEAR 04/19/2019 0021   LABSPEC 1.014 04/19/2019 0021   PHURINE 6.0 04/19/2019 0021   GLUCOSEU NEGATIVE 04/19/2019 0021   HGBUR NEGATIVE 04/19/2019 0021   BILIRUBINUR NEGATIVE 04/19/2019 0021   KETONESUR 20 (A) 04/19/2019 0021   PROTEINUR 30 (A) 04/19/2019 0021   UROBILINOGEN 1.0 10/10/2012 1902   NITRITE NEGATIVE 04/19/2019 0021   LEUKOCYTESUR NEGATIVE 04/19/2019 0021   Sepsis Labs Invalid input(s): PROCALCITONIN,  WBC,  LACTICIDVEN Microbiology Recent Results (from the past 240 hour(s))  SARS CORONAVIRUS 2 (TAT 6-24 HRS) Nasopharyngeal Nasopharyngeal Swab     Status: None   Collection Time: 04/18/19  9:12 PM   Specimen: Nasopharyngeal Swab  Result Value Ref Range Status   SARS Coronavirus 2 NEGATIVE NEGATIVE Final    Comment: (NOTE) SARS-CoV-2 target nucleic acids are NOT DETECTED. The SARS-CoV-2 RNA is generally detectable in upper and lower respiratory specimens during the acute phase of infection. Negative results do not preclude SARS-CoV-2 infection, do not rule out co-infections with other pathogens, and should not be used as the sole basis for treatment or other patient management decisions. Negative results must be combined with clinical observations, patient history, and epidemiological information. The expected result is Negative. Fact Sheet for Patients: SugarRoll.be Fact Sheet for Healthcare Providers: https://www.woods-mathews.com/ This test is not yet approved or cleared by the [place] FDA and  has been authorized for detection and/or diagnosis of SARS-CoV-2 by FDA under an Emergency Use Authorization (EUA). This EUA will remain  in effect (meaning this test can be used) for the duration of the COVID-19 declaration under Section 56 4(b)(1) of the Act, 21 U.S.C. section 360bbb-3(b)(1), unless the authorization is terminated or revoked  sooner. Performed at [facility], [place] [address], [place], [place] [address]   MRSA PCR Screening     Status: None   Collection Time: 04/19/19  2:02 AM   Specimen: Nasal Mucosa; Nasopharyngeal  Result Value Ref Range Status   MRSA by PCR NEGATIVE NEGATIVE Final    Comment:        The GeneXpert MRSA Assay (FDA approved  for NASAL specimens only), is one component of a comprehensive MRSA colonization surveillance program. It is not intended to diagnose MRSA infection nor to guide or monitor treatment for MRSA infections. Performed at [facility], [place] [address], [place], [place] [address]      Time coordinating discharge: Over 30 minutes  SIGNED:   Desiree Hane, MD  [facility] 04/23/2019, 1:46 PM Pager   If 7PM-7AM, please contact night-coverage [email or web address] Password [email or web address]

## 2019-04-23 NOTE — TOC Initial Note (Signed)
Transition of Care [facility]) - Initial/Assessment Note    Patient Details  Name: Eugene Jackson MRN: [id number] Date of Birth: [birth year]  [facility] CM/SW Contact:    Trish Mage, LCSW Phone Number: 04/23/2019, 4:22 PM  Clinical Narrative:  Mr Solanki was seen in response to physician consult for alcohol use, and in follow up to PT referral for [facility] PT.  He presents with a vague plan of alcohol cessation, stating he needs to stay away from certain people who drink, but when pressed is unwilling to commit further to [facility] meetings, SA IOP or inpatient rehab.  "I've been to [facility] in the past.  I met a woman three weeks after I got out, we went to a bar and that was the end of that."  He admits to having a DUI in the past, but has since gotten his license back.  He also admits to being released from his job at the [employer] on February 2nd "because I was coming in hung over and shaky, and could not work like that.  They told me to go home and get help.  I haven't been back since."  States he intends to go back, but has enough income that he does not need to.  Has lost both his father and brother in the past year.  I was unable to get him set up with [facility] PT as his commercial insurance carrier is not accepted by home health agencies.  However, I did refer him to Rehabilitation at [facility].  I faxed them the orders and information they were seeking. TOC sign off.                  Expected Discharge Plan: Home/Self Care Barriers to Discharge: No Barriers Identified   Patient Goals and CMS Choice Patient states their goals for this hospitalization and ongoing recovery are:: "I need to stay away from certain people."      Expected Discharge Plan and Services Expected Discharge Plan: Home/Self Care In-house Referral: Clinical Social Work     Living arrangements for the past 2 months: Single Family Home Expected Discharge Date: 04/23/19                                    Prior Living Arrangements/Services Living arrangements for the past 2 months: Single Family Home Lives with:: Self Patient language and need for interpreter reviewed:: Yes Do you feel safe going back to the place where you live?: Yes      Need for Family Participation in Patient Care: No (Comment) Care giver support system in place?: Yes (comment) Current home services: DME Criminal Activity/Legal Involvement Pertinent to Current Situation/Hospitalization: No - Comment as needed  Activities of Daily Living Home Assistive Devices/Equipment: None ADL Screening (condition at time of admission) Patient's cognitive ability adequate to safely complete daily activities?: Yes Is the patient deaf or have difficulty hearing?: No Does the patient have difficulty seeing, even when wearing glasses/contacts?: No Does the patient have difficulty concentrating, remembering, or making decisions?: No Patient able to express need for assistance with ADLs?: Yes Does the patient have difficulty dressing or bathing?: Yes Independently performs ADLs?: Yes (appropriate for developmental age) Does the patient have difficulty walking or climbing stairs?: Yes Weakness of Legs: Both Weakness of Arms/Hands: Both  Permission Sought/Granted  Emotional Assessment Appearance:: Appears stated age Attitude/Demeanor/Rapport: Engaged   Orientation: : Oriented to Self, Oriented to Place, Oriented to  Time, Oriented to Situation Alcohol / Substance Use: Alcohol Use Psych Involvement: No (comment)  Admission diagnosis:  Hypokalemia [E87.6] Hypomagnesemia [E83.42] Alcohol withdrawal syndrome (HCC) [F10.239] Alcohol withdrawal syndrome without complication ([place]) [M76.808] Alcohol abuse [F10.10] Patient Active Problem List   Diagnosis Date Noted  . Alcohol withdrawal syndrome ([place]) 04/19/2019  . Hypophosphatemia 04/19/2019  . Left arm swelling 04/19/2019  . B12  deficiency 12/03/2016  . Macrocytic anemia   . Anemia 12/02/2016  . Acute lower UTI: Probable  11/30/2016  . PNA (pneumonia): Probable 11/30/2016  . Fever   . Essential hypertension 11/28/2016  . Alcohol related seizure ([place])   . Hypomagnesemia   . Iron overload 11/09/2012  . Thrombocytosis ([place]) 10/29/2012  . Severe sepsis ([place]) 10/19/2012  . Parapneumonic effusion 10/18/2012  . Hemoptysis 10/15/2012  . Smoker 10/15/2012  . Bacteremia 10/12/2012  . Acute respiratory failure ([place]) 10/10/2012  . HCAP (healthcare-associated pneumonia) 10/10/2012  . Acute alcohol intoxication ([place]) 09/05/2012  . Anemia of chronic disease 09/05/2012  . Abnormal liver function tests 09/05/2012  . Moderate protein-calorie malnutrition ([place]) 09/05/2012  . Alcohol withdrawal seizure ([place]) 09/05/2012  . Thrombocytopenia ([place]) 08/31/2012  . DIC (disseminated intravascular coagulation) ([place]) 08/31/2012  . Alcohol abuse 08/30/2012  . Hyponatremia 08/30/2012  . Hypokalemia 08/30/2012  . Elevated lipase 08/30/2012  . Pancytopenia ([place]) 08/30/2012  . Encephalopathy acute 08/30/2012   PCP:  [name], MD Pharmacy:   [id number] [place], [place] - [address] [facility] [place] [id number]: [phone]: [phone]    Social Determinants of Health (SDOH) Interventions    Readmission Risk Interventions No flowsheet data found.

## 2019-04-27 LAB — VITAMIN B1: Vitamin B1 (Thiamine): 57.7 nmol/L — ABNORMAL LOW (ref 66.5–200.0)

## 2019-06-13 ENCOUNTER — Encounter: Payer: Self-pay | Admitting: General Practice

## 2019-06-18 ENCOUNTER — Telehealth: Payer: Self-pay | Admitting: Family Medicine

## 2019-06-18 NOTE — Telephone Encounter (Signed)
Patient has been scheduled from referral

## 2019-06-25 NOTE — Progress Notes (Signed)
Cardiology Office Note   Date:  06/27/2019   ID:  Eugene Jackson, DOB 25-Oct-1970, MRN [id number]  PCP:  [name], MD  Cardiologist:   No primary care provider on file. Referring:  Leonides Sake, MD  Chief Complaint  Patient presents with  . Dizziness      History of Present Illness: Eugene Jackson is a 49 y.o. male who is referred by Hamrick, Lorin Mercy, MD for evaluation of orthostatic hypotension.  Patient was recently was in April.  He has alcohol abuse was admitted with complications of this.  I did review these records.  He had significant orthostatic hypotension and was one report of SVT but no other acute cardiac issues.  Since April he has not been drinking.  Was malnourished eating better.  Cardiovascular symptoms such as chest pressure, neck or arm discomfort.  He has had no shortness of breath, PND or orthopnea.  He works as a Furniture conservator/restorer.  However, he does describe significant orthostatic blood pressure drops.  Michela Pitcher it felt at his primary care office.  Was very dizzy when he stands up.  He has to give himself a minute when he goes from a lying to sitting to standing position.  He is not had any frank syncope thankfully.  He can do his work which includes walking all day long back-and-forth and has not had syncope.  He feels dizzy at times.  He is not really describing his heart racing or skipping.  Echo in 2014 demonstrated well-preserved ejection fraction of 55%.   Past Medical History:  Diagnosis Date  . Alcoholism ([place])   . Broken femur ([place])    right  . Hypertension   . PONV (postoperative nausea and vomiting)   . Seizures ([place])     Past Surgical History:  Procedure Laterality Date  . JOINT REPLACEMENT     bilateral hip replacement  . WRIST SURGERY       Current Outpatient Medications  Medication Sig Dispense Refill  . folic acid (FOLVITE) 1 MG tablet Take 1 tablet (1 mg total) by mouth daily. 30 tablet 1  . gabapentin (NEURONTIN) 300 MG  capsule     . magnesium oxide (MAG-OX) 400 (241.3 Mg) MG tablet Take 1 tablet (400 mg total) by mouth 2 (two) times daily. 60 tablet 0  . Multiple Vitamin (MULTIVITAMIN WITH MINERALS) TABS tablet Take 1 tablet by mouth daily. 30 tablet 1  . thiamine 100 MG tablet Take 1 tablet (100 mg total) by mouth daily. 30 tablet 1   No current facility-administered medications for this visit.    Allergies:   Patient has no known allergies.    Social History:  The patient  reports that he has been smoking cigarettes. He has a 2.60 pack-year smoking history. He has never used smokeless tobacco. He reports current alcohol use. He reports that he does not use drugs.   Family History:  The patient's family history includes CAD in his brother and father; Cancer (age of onset: 14) in his mother; Peripheral vascular disease in his mother.    ROS:  Please see the history of present illness.   Otherwise, review of systems are positive for none.   All other systems are reviewed and negative.    PHYSICAL EXAM: VS:  BP 118/74   Pulse 96   Ht 5\' 8"  (1.727 m)   Wt 146 lb 9.6 oz (66.5 kg)   SpO2 97%   BMI 22.29 kg/m  ,  BMI Body mass index is 22.29 kg/m. GENERAL:  Well appearing HEENT:  Pupils equal round and reactive, fundi not visualized, oral mucosa unremarkable NECK:  No jugular venous distention, waveform within normal limits, carotid upstroke brisk and symmetric, no bruits, no thyromegaly LYMPHATICS:  No cervical, inguinal adenopathy LUNGS:  Clear to auscultation bilaterally BACK:  No CVA tenderness CHEST:  Unremarkable HEART:  PMI not displaced or sustained,S1 and S2 within normal limits, positive S3, no S4, no clicks, no rubs, no murmurs ABD:  Flat, positive bowel sounds normal in frequency in pitch, no bruits, no rebound, no guarding, no midline pulsatile mass, no hepatomegaly, no splenomegaly EXT:  2 plus pulses throughout, no edema, no cyanosis no clubbing SKIN:  No rashes no nodules NEURO:   Cranial nerves II through XII grossly intact, motor grossly intact throughout PSYCH:  Cognitively intact, oriented to person place and time    EKG:  EKG is ordered today. The ekg ordered today demonstrates sinus rhythm, rate 83, axis within normal limits, intervals within normal limits, no acute ST-T wave changes.   Recent Labs: 04/19/2019: TSH 3.511 04/22/2019: Hemoglobin 9.1; Platelets 213 04/23/2019: ALT 24; BUN 7; Creatinine, Ser 0.63; Magnesium 1.4; Potassium 4.0; Sodium 133    Lipid Panel No results found for: CHOL, TRIG, HDL, CHOLHDL, VLDL, LDLCALC, LDLDIRECT    Wt Readings from Last 3 Encounters:  06/27/19 146 lb 9.6 oz (66.5 kg)  04/18/19 141 lb (64 kg)  11/29/16 143 lb 8.3 oz (65.1 kg)      Other studies Reviewed: Additional studies/ records that were reviewed today include: Hospital records.   Primary office records and labs. Review of the above records demonstrates:  Please see elsewhere in the note.     ASSESSMENT AND PLAN:  ORTHOSTATIC HYPOTENSION.   I am going to start conservatively and I have suggested an abdominal binder.  He did not tolerate stockings because of neuropathy.  We talked about salt and fluid supplementation.  He is already increased his fluid intake.  He can increase his salt intake.  I am to check an echocardiogram as he appears to have an S3 and has EtOH history.  This will help US guide therapy to when I consider fludrocortisone versus midodrine.  I do note that his basic metabolic profile which was significantly abnormal in the hospital was returned to normal.  Of note he was only mildly orthostatic in the office today and actually not at 3 minutes.  ETOH: I encouraged continued abstinence.  We talked about nutrition.Marland Kitchen  COVID EDUCATION:  He has been vaccinated .     Current medicines are reviewed at length with the patient today.  The patient does not have concerns regarding medicines.  The following changes have been made:  no  change  Labs/ tests ordered today include:   Orders Placed This Encounter  Procedures  . EKG 12-Lead  . ECHOCARDIOGRAM COMPLETE     Disposition:   FU with me after the echo.      Signed, Minus Breeding, MD  06/27/2019 9:22 AM    [facility]

## 2019-06-27 ENCOUNTER — Other Ambulatory Visit: Payer: Self-pay

## 2019-06-27 ENCOUNTER — Ambulatory Visit: Payer: 59 | Admitting: Cardiology

## 2019-06-27 ENCOUNTER — Encounter: Payer: Self-pay | Admitting: Cardiology

## 2019-06-27 VITALS — BP 118/74 | HR 96 | Ht 68.0 in | Wt 146.6 lb

## 2019-06-27 DIAGNOSIS — Z7189 Other specified counseling: Secondary | ICD-10-CM | POA: Diagnosis not present

## 2019-06-27 DIAGNOSIS — I951 Orthostatic hypotension: Secondary | ICD-10-CM

## 2019-06-27 NOTE — Patient Instructions (Signed)
Medication Instructions:  Your physician recommends that you continue on your current medications as directed. Please refer to the Current Medication list given to you today.  *If you need a refill on your cardiac medications before your next appointment, please call your pharmacy*    Testing/Procedures: Your physician has requested that you have an echocardiogram. Echocardiography is a painless test that uses sound waves to create images of your heart. It provides your doctor with information about the size and shape of your heart and how well your heart's chambers and valves are working. This procedure takes approximately one hour. There are no restrictions for this procedure.   Follow-Up: At [facility], you and your health needs are our priority.  As part of our continuing mission to provide you with exceptional heart care, we have created designated Provider Care Teams.  These Care Teams include your primary Cardiologist (physician) and Advanced Practice Providers (APPs -  Physician Assistants and Nurse Practitioners) who all work together to provide you with the care you need, when you need it.  We recommend signing up for the patient portal called "MyChart".  Sign up information is provided on this After Visit Summary.  MyChart is used to connect with patients for Virtual Visits (Telemedicine).  Patients are able to view lab/test results, encounter notes, upcoming appointments, etc.  Non-urgent messages can be sent to your provider as well.   To learn more about what you can do with MyChart, go to [email or web address].    Your next appointment:   1 month(s)  The format for your next appointment:   In Person  Provider:   Minus Breeding, MD

## 2019-07-05 ENCOUNTER — Other Ambulatory Visit: Payer: Self-pay

## 2019-07-05 ENCOUNTER — Ambulatory Visit (HOSPITAL_COMMUNITY): Payer: 59 | Attending: Cardiology

## 2019-07-05 DIAGNOSIS — I951 Orthostatic hypotension: Secondary | ICD-10-CM | POA: Diagnosis not present

## 2019-07-10 NOTE — Progress Notes (Signed)
Cardiology Office Note   Date:  07/11/2019   ID:  Eugene Jackson, DOB 08-Sep-1970, MRN [id number]  PCP:  Leonides Sake, MD  Cardiologist:   No primary care provider on file. Referring:  [name], MD  Chief Complaint  Patient presents with  . Loss of Consciousness      History of Present Illness: Eugene Jackson is a 49 y.o. male who is referred by [name], MD for evaluation of orthostatic hypotension.  The patient was recently was in April.  He has alcohol abuse was admitted with complications of this.  He had significant orthostatic hypotension and was one report of SVT but no other acute cardiac issues.  Echo in 2014 demonstrated well-preserved ejection fraction of 55%.   I repeated this after the last visit and he had a normal EF and no significant abnormalities.    Since I last saw him he has had no further syncope. He gets a little lightheaded if he moves suddenly but he has good control over how to manage this. He has leg pain and he is being treated for probable neuropathy with up titration of Neurontin although he has not really had any improvement in his symptoms. He denies any palpitations, presyncope or syncope. He has no shortness of breath, PND or orthopnea. Has had nothing to drink alcohol wise since April.   Past Medical History:  Diagnosis Date  . Alcoholism ([place])   . Broken femur ([place])    right  . Hypertension   . PONV (postoperative nausea and vomiting)   . Seizures ([place])     Past Surgical History:  Procedure Laterality Date  . JOINT REPLACEMENT     bilateral hip replacement  . WRIST SURGERY       Current Outpatient Medications  Medication Sig Dispense Refill  . folic acid (FOLVITE) 1 MG tablet Take 1 tablet (1 mg total) by mouth daily. 30 tablet 1  . gabapentin (NEURONTIN) 300 MG capsule     . magnesium oxide (MAG-OX) 400 (241.3 Mg) MG tablet Take 1 tablet (400 mg total) by mouth 2 (two) times daily. 60 tablet 0  . Multiple  Vitamin (MULTIVITAMIN WITH MINERALS) TABS tablet Take 1 tablet by mouth daily. 30 tablet 1  . thiamine 100 MG tablet Take 1 tablet (100 mg total) by mouth daily. 30 tablet 1   No current facility-administered medications for this visit.    Allergies:   Patient has no known allergies.    ROS:  Please see the history of present illness.   Otherwise, review of systems are positive for none.   All other systems are reviewed and negative.    PHYSICAL EXAM: VS:  BP 128/80   Pulse 79   Ht 5\' 8"  (1.727 m)   Wt 143 lb 9.6 oz (65.1 kg)   SpO2 93%   BMI 21.83 kg/m  , BMI Body mass index is 21.83 kg/m. GENERAL:  Well appearing NECK:  No jugular venous distention, waveform within normal limits, carotid upstroke brisk and symmetric, no bruits, no thyromegaly LUNGS:  Clear to auscultation bilaterally CHEST:  Unremarkable HEART:  PMI not displaced or sustained,S1 and S2 within normal limits, no S3, no S4, no clicks, no rubs, no murmurs ABD:  Flat, positive bowel sounds normal in frequency in pitch, no bruits, no rebound, no guarding, no midline pulsatile mass, no hepatomegaly, no splenomegaly EXT:  2 plus pulses upper and decreased dorsalis pedis and posterior tibialis bilateral lower,  no edema, no cyanosis no clubbing, left leg venous stasis changes with a slight nonhealing rash   EKG:  EKG is not ordered today.  Recent Labs: 04/19/2019: TSH 3.511 04/22/2019: Hemoglobin 9.1; Platelets 213 04/23/2019: ALT 24; BUN 7; Creatinine, Ser 0.63; Magnesium 1.4; Potassium 4.0; Sodium 133    Lipid Panel No results found for: CHOL, TRIG, HDL, CHOLHDL, VLDL, LDLCALC, LDLDIRECT    Wt Readings from Last 3 Encounters:  07/11/19 143 lb 9.6 oz (65.1 kg)  06/27/19 146 lb 9.6 oz (66.5 kg)  04/18/19 141 lb (64 kg)      Other studies Reviewed: Additional studies/ records that were reviewed today include: Echo Review of the above records demonstrates:  Please see elsewhere in the note.     ASSESSMENT AND  PLAN:  ORTHOSTATIC HYPOTENSION.    We have a long discussion about this. This point I do not think that he needs volume expander or midodrine because he can manage his orthostasis conservatively. However, if he has problems I would start one of these agents in the future. For now he volume loads and uses salt. Of note he could not tolerate an abdominal binder which I suggested. He has not noticed any improvement with compression stockings in the past.  ETOH: He is still not drinking alcohol. I applauded this.  LEG PAIN:   He does have mildly reduced pulses and I will check ABIs.  COVID EDUCATION:  He has been vaccinated .     Current medicines are reviewed at length with the patient today.  The patient does not have concerns regarding medicines.  The following changes have been made: None  Labs/ tests ordered today include: None  Orders Placed This Encounter  Procedures  . VAS [place] LOWER EXTREMITY ARTERIAL DUPLEX  . VAS [place] ABI WITH/WO TBI     Disposition:   FU with me as needed.  [name], MD  07/11/2019 9:45 AM    [place]

## 2019-07-11 ENCOUNTER — Encounter: Payer: Self-pay | Admitting: Cardiology

## 2019-07-11 ENCOUNTER — Ambulatory Visit: Payer: 59 | Admitting: Cardiology

## 2019-07-11 ENCOUNTER — Other Ambulatory Visit: Payer: Self-pay

## 2019-07-11 VITALS — BP 128/80 | HR 79 | Ht 68.0 in | Wt 143.6 lb

## 2019-07-11 DIAGNOSIS — I951 Orthostatic hypotension: Secondary | ICD-10-CM

## 2019-07-11 DIAGNOSIS — M79604 Pain in right leg: Secondary | ICD-10-CM | POA: Diagnosis not present

## 2019-07-11 DIAGNOSIS — M79605 Pain in left leg: Secondary | ICD-10-CM | POA: Diagnosis not present

## 2019-07-11 NOTE — Patient Instructions (Signed)
Medication Instructions:  Your physician recommends that you continue on your current medications as directed. Please refer to the Current Medication list given to you today.  *If you need a refill on your cardiac medications before your next appointment, please call your pharmacy*  Lab Work: NONE  Testing/Procedures: Your physician has requested that you have a lower or upper extremity arterial duplex. This test is an ultrasound of the arteries in the legs or arms. It looks at arterial blood flow in the legs and arms. Allow one hour for Lower and Upper Arterial scans. There are no restrictions or special instructions  Your physician has requested that you have an ankle brachial index (ABI). During this test an ultrasound and blood pressure cuff are used to evaluate the arteries that supply the arms and legs with blood. Allow thirty minutes for this exam. There are no restrictions or special instructions.  Follow-Up: AS NEEDED

## 2019-07-29 ENCOUNTER — Telehealth: Payer: Self-pay

## 2019-07-29 NOTE — Telephone Encounter (Addendum)
Attempted to call patient can not leave voice message due to voice mailbox is not set up   ----- Message from Minus Breeding, MD sent at 07/07/2019  8:22 PM EDT ----- No significant cardiac abnormalities.  NL LV function.  No change in therapy.  Call Mr. Bodiford with the results and send results to [facility], Lorin Mercy, MD

## 2019-08-13 ENCOUNTER — Ambulatory Visit (HOSPITAL_COMMUNITY): Admission: RE | Admit: 2019-08-13 | Payer: 59 | Source: Ambulatory Visit | Attending: Cardiology | Admitting: Cardiology

## 2019-10-03 ENCOUNTER — Other Ambulatory Visit: Payer: Self-pay | Admitting: Cardiology

## 2019-10-03 DIAGNOSIS — I700 Atherosclerosis of aorta: Secondary | ICD-10-CM

## 2019-10-03 DIAGNOSIS — R0989 Other specified symptoms and signs involving the circulatory and respiratory systems: Secondary | ICD-10-CM

## 2019-10-03 DIAGNOSIS — M79604 Pain in right leg: Secondary | ICD-10-CM

## 2019-10-23 ENCOUNTER — Ambulatory Visit (HOSPITAL_COMMUNITY): Payer: 59 | Attending: Cardiovascular Disease

## 2020-01-01 ENCOUNTER — Encounter (HOSPITAL_COMMUNITY): Payer: Self-pay

## 2021-08-06 ENCOUNTER — Ambulatory Visit: Payer: Self-pay | Admitting: Neurology

## 2021-08-06 ENCOUNTER — Telehealth: Payer: Self-pay | Admitting: Neurology

## 2021-08-06 NOTE — Telephone Encounter (Signed)
Pt called and said he needed to cancel his appointment for this appointment. Pt said he couldn't find a ride to appointment.

## 2021-08-23 ENCOUNTER — Ambulatory Visit: Payer: Self-pay | Admitting: Neurology

## 2021-08-23 ENCOUNTER — Encounter: Payer: Self-pay | Admitting: Neurology

## 2021-08-23 VITALS — BP 157/86 | HR 93 | Ht 68.0 in | Wt 126.5 lb

## 2021-08-23 DIAGNOSIS — R030 Elevated blood-pressure reading, without diagnosis of hypertension: Secondary | ICD-10-CM

## 2021-08-23 DIAGNOSIS — R269 Unspecified abnormalities of gait and mobility: Secondary | ICD-10-CM

## 2021-08-23 DIAGNOSIS — F1010 Alcohol abuse, uncomplicated: Secondary | ICD-10-CM

## 2021-08-23 DIAGNOSIS — F10939 Alcohol use, unspecified with withdrawal, unspecified: Secondary | ICD-10-CM

## 2021-08-23 DIAGNOSIS — R270 Ataxia, unspecified: Secondary | ICD-10-CM

## 2021-08-23 NOTE — Patient Instructions (Addendum)
Continue current medications, including supplement and multivitamins  Consider starting propanolol to ?hypertension and tremors  DMV paperwork already completed by PCP  Strongly recommend alcohol cessation, he will likely need to be admitted to rehab. He has a new grand daughter and loves to spent time with her.  Return as needed

## 2021-08-23 NOTE — Progress Notes (Signed)
[facility]  PATIENT: Eugene Jackson DOB: 05/30/1970  REQUESTING CLINICIAN: Hamrick, Lorin Mercy, MD HISTORY FROM: Patient  REASON FOR VISIT: Alcohol withdrawal seizure   HISTORICAL  CHIEF COMPLAINT:  Chief Complaint  Patient presents with   New Patient (Initial Visit)    Rm 12. Alone. NP/Paper proficient/Maura Hamrick MD [facility] [place] [place]/Hospital f/u alcohol withdrawal seizure.    HISTORY OF PRESENT ILLNESS:  This is a 51 year old man with long history of alcohol abuse, hypertension who is presenting after being admitted to hospital for probable alcohol withdrawal seizure.  Patient reports on July 7 he remembers going to work and the next thing that he knows is waking up in the ambulance and the rest of his hospitalization was unclear.  He does remember bits and pieces of the hospitalization but nothing in great detail.  He reports 3 days later he was taken home by his daughter but he does not remember that.  From the discharge paper, patient seizures were related to alcohol withdrawal.  He states 2 days prior to the seizure, he has decreased his alcohol intake.  He reports that he drinks heavily.   He reports that back 6 or 7 years ago, he also had a seizure that is likely related to alcohol.  He has 2 DUIs. Currently he is not taking any antiseizure medication.   Since discharge from the hospital, he continues to drink, last drink was yesterday.  He reported he had tried multiple times to quit drinking, even went to a 30-day rehab but failed.     OTHER MEDICAL CONDITIONS: Alcohol abuse, hypertension    REVIEW OF SYSTEMS: Full 14 system review of systems performed and negative with exception of: As noted in the HPI   ALLERGIES: No Known Allergies  HOME MEDICATIONS: Outpatient Medications Prior to Visit  Medication Sig Dispense Refill   folic acid (FOLVITE) 1 MG tablet Take 1 tablet (1 mg total) by mouth daily. 30 tablet 1   KLOR-CON  M20 20 MEQ tablet Take 20 mEq by mouth daily.     magnesium oxide (MAG-OX) 400 (240 Mg) MG tablet Take 1 tablet by mouth 2 (two) times daily.     thiamine 100 MG tablet Take 1 tablet (100 mg total) by mouth daily. 30 tablet 1   gabapentin (NEURONTIN) 300 MG capsule      magnesium oxide (MAG-OX) 400 (241.3 Mg) MG tablet Take 1 tablet (400 mg total) by mouth 2 (two) times daily. 60 tablet 0   Multiple Vitamin (MULTIVITAMIN WITH MINERALS) TABS tablet Take 1 tablet by mouth daily. 30 tablet 1   No facility-administered medications prior to visit.    PAST MEDICAL HISTORY: Past Medical History:  Diagnosis Date   Alcoholism ([place])    Broken femur ([place])    right   Hypertension    PONV (postoperative nausea and vomiting)    Seizures ([facility])     PAST SURGICAL HISTORY: Past Surgical History:  Procedure Laterality Date   JOINT REPLACEMENT     bilateral hip replacement   WRIST SURGERY      FAMILY HISTORY: Family History  Problem Relation Age of Onset   Cancer Mother 56       breast ca   Peripheral vascular disease Mother    CAD Father        Stents   CAD Brother        Died of MI apparently 59    SOCIAL HISTORY: Social History   Socioeconomic  History   Marital status: Married    Spouse name: Not on file   Number of children: Not on file   Years of education: Not on file   Highest education level: Not on file  Occupational History   Not on file  Tobacco Use   Smoking status: Some Days    Packs/day: 0.10    Years: 26.00    Total pack years: 2.60    Types: Cigarettes   Smokeless tobacco: Never   Tobacco comments:    not really inhaling right now  Vaping Use   Vaping Use: Never used  Substance and Sexual Activity   Alcohol use: Yes    Comment: 1/5 th vodka every 2 days   Drug use: No   Sexual activity: Not on file  Other Topics Concern   Not on file  Social History Narrative   Lives alone.     Social Determinants of Health   Financial Resource Strain: Not on  file  Food Insecurity: Not on file  Transportation Needs: Not on file  Physical Activity: Not on file  Stress: Not on file  Social Connections: Not on file  Intimate Partner Violence: Not on file    PHYSICAL EXAM  GENERAL EXAM/CONSTITUTIONAL: Vitals:  Vitals:   08/23/21 1306  BP: (!) 157/86  Pulse: 93  Weight: 126 lb 8 oz (57.4 kg)  Height: '5\' 8"'$  (1.727 m)   Body mass index is 19.23 kg/m. Wt Readings from Last 3 Encounters:  08/23/21 126 lb 8 oz (57.4 kg)  07/11/19 143 lb 9.6 oz (65.1 kg)  06/27/19 146 lb 9.6 oz (66.5 kg)   Patient is in no distress; well developed, nourished and groomed; neck is supple, Tremors noted in the evaluation, resting and action tremors.   EYES: Pupils round and reactive to light, Visual fields full to confrontation, Extraocular movements intacts,   MUSCULOSKELETAL: Gait, strength, tone, movements noted in Neurologic exam below  NEUROLOGIC: MENTAL STATUS:      No data to display         awake, alert, oriented to person, place and time recent and remote memory intact normal attention and concentration language fluent, comprehension intact, naming intact fund of knowledge appropriate  CRANIAL NERVE:  2nd, 3rd, 4th, 6th - pupils equal and reactive to light, visual fields full to confrontation, extraocular muscles intact, no nystagmus 5th - facial sensation symmetric 7th - facial strength symmetric 8th - hearing intact 9th - palate elevates symmetrically, uvula midline 11th - shoulder shrug symmetric 12th - tongue protrusion midline  MOTOR:  normal bulk and tone, full strength in the BUE, BLE  SENSORY:  normal and symmetric to light touch,  COORDINATION:  Mild ataxia on finger-nose-finger  GAIT/STATION:  Wide based, ataxic gait      DIAGNOSTIC DATA (LABS, IMAGING, TESTING) - I reviewed patient records, labs, notes, testing and imaging myself where available.  Lab Results  Component Value Date   WBC 9.3 04/22/2019    HGB 9.1 (L) 04/22/2019   HCT 28.4 (L) 04/22/2019   MCV 117.8 (H) 04/22/2019   PLT 213 04/22/2019      Component Value Date/Time   NA 133 (L) 04/23/2019 0623   NA 139 10/30/2012 1602   K 4.0 04/23/2019 0623   K 4.5 10/30/2012 1602   CL 106 04/23/2019 0623   CO2 20 (L) 04/23/2019 0623   CO2 24 10/30/2012 1602   GLUCOSE 93 04/23/2019 0623   GLUCOSE 88 10/30/2012 1602   BUN 7 04/23/2019  0623   BUN 7.0 10/30/2012 1602   CREATININE 0.63 04/23/2019 0623   CREATININE 0.8 10/30/2012 1602   CALCIUM 7.4 (L) 04/23/2019 0623   CALCIUM 10.7 (H) 10/30/2012 1602   PROT 5.1 (L) 04/23/2019 0623   PROT 9.0 (H) 10/30/2012 1602   ALBUMIN 2.3 (L) 04/23/2019 0623   ALBUMIN 3.2 (L) 10/30/2012 1602   AST 54 (H) 04/23/2019 0623   AST 23 10/30/2012 1602   ALT 24 04/23/2019 0623   ALT 21 10/30/2012 1602   ALKPHOS 87 04/23/2019 0623   ALKPHOS 96 10/30/2012 1602   BILITOT 0.8 04/23/2019 0623   BILITOT 0.24 10/30/2012 1602   GFRNONAA >60 04/23/2019 0623   GFRAA >60 04/23/2019 0623   No results found for: "CHOL", "HDL", "LDLCALC", "LDLDIRECT", "TRIG", "CHOLHDL" No results found for: "HGBA1C" Lab Results  Component Value Date   VITAMINB12 644 04/19/2019   Lab Results  Component Value Date   TSH 3.511 04/19/2019    CT head 07/09/2021 Mild cerebral volume loss with ex vacuo dilatation of the CSF-containing spaces. There are scattered and confluent hypodense foci within the periventricular and deep white matter.  These are nonspecific but commonly associated with small vessel ischemic changes. Scattered polypoid mucosal thickening of the sphenoid sinuses and right frontal recess. The sinuses are otherwise pneumatized. Atherosclerotic calcifications of the carotid siphons.   IMPRESSION:  No acute intracranial abnormality.    ASSESSMENT AND PLAN  51 y.o. year old male with long history of alcohol abuse, hypertension, who is presenting after alcohol withdrawal seizure.  Per patient this is likely  his second alcohol withdrawal seizure.  He still drinks, last drink was yesterday.  In the past he was admitted to rehab for 30 days but failed.  I have informed patient that treatment for alcohol withdrawal seizure is not with anticonvulsant but it is with alcohol cessation.   He does have a new granddaughter that he likes to spend time with and I have informed him that in order to continue to enjoy spending time with his granddaughter, he will definitely need to stop drinking.  He does have evidence of ataxia on exam and this is likely from alcohol abuse.  He did have two DUIs in the past and his driver license is in the process of being revoked unless he submits paperwork to the [facility].  Again recommend alcohol cessation.   Lastly his blood pressure was elevated today and he does have tremor on examination, can consider starting beta-blocker like propranolol to help with the tremor.  Continue to follow with the PCP, return as needed.   1. Alcohol withdrawal syndrome with complication ([place])   2. Alcohol abuse   3. Ataxia   4. Gait abnormality   5. Elevated blood pressure reading      Patient Instructions  Continue current medications, including supplement and multivitamins  Consider starting propanolol to ?hypertension and tremors  DMV paperwork already completed by PCP  Strongly recommend alcohol cessation, he will likely need to be admitted to rehab. He has a new grand daughter and love to spent time with her.  Return as needed   No orders of the defined types were placed in this encounter.   No orders of the defined types were placed in this encounter.   Return if symptoms worsen or fail to improve.  I have spent a total of 45 minutes dedicated to this patient today, preparing to see patient, performing a medically appropriate examination and evaluation, ordering tests and/or medications and  procedures, and counseling and educating the patient/family/caregiver; independently  interpreting result and communicating results to the family/patient/caregiver; and documenting clinical information in the electronic medical record.    Alric Ran, MD 08/23/2021, 1:43 PM  [facility] [address] [place], [place] [address] [phone]

## 2022-07-28 ENCOUNTER — Telehealth: Payer: Self-pay | Admitting: Neurology

## 2022-07-28 NOTE — Telephone Encounter (Signed)
Patient called the office and is looking to see what he needs to do to get his drivers license back. He has been seizure free for over a year. He can be reached after 3pm on the number listed in his chart. I advised Dr. Teresa Coombs was out until next Tuesday but someone will call him back.

## 2022-07-28 NOTE — Telephone Encounter (Signed)
Called pt back informed him of message nurse [facility] sent. Pt stated he will try them again.

## 2022-08-05 NOTE — Telephone Encounter (Signed)
He will need an appointment. We can schedule him and add him to the cancellation list.

## 2022-08-05 NOTE — Telephone Encounter (Signed)
Pt called stating that his PCP will not clear him until provider here clears to drive. Pt would like to know what he is needing to do to get cleared. Please advise.
